# Patient Record
Sex: Female | Born: 1940
Health system: Southern US, Community
[De-identification: ages and names within clinical notes are randomized; demographics above are authoritative.]

## PROBLEM LIST (undated history)

## (undated) DIAGNOSIS — D329 Benign neoplasm of meninges, unspecified: Secondary | ICD-10-CM

## (undated) DIAGNOSIS — I10 Essential (primary) hypertension: Secondary | ICD-10-CM

## (undated) DIAGNOSIS — K219 Gastro-esophageal reflux disease without esophagitis: Secondary | ICD-10-CM

## (undated) DIAGNOSIS — I4891 Unspecified atrial fibrillation: Secondary | ICD-10-CM

## (undated) DIAGNOSIS — R112 Nausea with vomiting, unspecified: Secondary | ICD-10-CM

## (undated) DIAGNOSIS — R569 Unspecified convulsions: Secondary | ICD-10-CM

## (undated) DIAGNOSIS — J189 Pneumonia, unspecified organism: Secondary | ICD-10-CM

## (undated) DIAGNOSIS — R51 Headache: Secondary | ICD-10-CM

## (undated) DIAGNOSIS — I428 Other cardiomyopathies: Secondary | ICD-10-CM

## (undated) DIAGNOSIS — Z9889 Other specified postprocedural states: Secondary | ICD-10-CM

## (undated) DIAGNOSIS — I5042 Chronic combined systolic (congestive) and diastolic (congestive) heart failure: Secondary | ICD-10-CM

## (undated) HISTORY — DX: Unspecified convulsions: R56.9

## (undated) HISTORY — DX: Unspecified atrial fibrillation: I48.91

## (undated) HISTORY — DX: Other cardiomyopathies: I42.8

## (undated) HISTORY — PX: CHOLECYSTECTOMY: SHX55

## (undated) HISTORY — DX: Chronic combined systolic (congestive) and diastolic (congestive) heart failure: I50.42

## (undated) HISTORY — DX: Benign neoplasm of meninges, unspecified: D32.9

## (undated) HISTORY — PX: KNEE ARTHROSCOPY: SHX127

## (undated) HISTORY — DX: Essential (primary) hypertension: I10

## (undated) HISTORY — PX: EYE SURGERY: SHX253

## (undated) HISTORY — PX: CATARACT EXTRACTION, BILATERAL: SHX1313

---

## 1981-01-20 HISTORY — PX: VAGINAL HYSTERECTOMY: SUR661

## 1990-12-21 HISTORY — PX: MYELOMININGOCELE REPAIR: SHX337

## 1997-03-31 ENCOUNTER — Ambulatory Visit (HOSPITAL_COMMUNITY): Admission: RE | Admit: 1997-03-31 | Discharge: 1997-03-31 | Payer: Self-pay | Admitting: Obstetrics and Gynecology

## 1997-06-26 ENCOUNTER — Other Ambulatory Visit: Admission: RE | Admit: 1997-06-26 | Discharge: 1997-06-26 | Payer: Self-pay | Admitting: Family Medicine

## 1998-06-11 ENCOUNTER — Ambulatory Visit (HOSPITAL_COMMUNITY): Admission: RE | Admit: 1998-06-11 | Discharge: 1998-06-11 | Payer: Self-pay | Admitting: Obstetrics and Gynecology

## 1998-07-09 ENCOUNTER — Other Ambulatory Visit: Admission: RE | Admit: 1998-07-09 | Discharge: 1998-07-09 | Payer: Self-pay | Admitting: Obstetrics and Gynecology

## 1999-07-01 ENCOUNTER — Ambulatory Visit (HOSPITAL_COMMUNITY): Admission: RE | Admit: 1999-07-01 | Discharge: 1999-07-01 | Payer: Self-pay | Admitting: Obstetrics and Gynecology

## 1999-07-01 ENCOUNTER — Encounter: Payer: Self-pay | Admitting: Obstetrics and Gynecology

## 1999-08-07 ENCOUNTER — Other Ambulatory Visit: Admission: RE | Admit: 1999-08-07 | Discharge: 1999-08-07 | Payer: Self-pay | Admitting: Obstetrics and Gynecology

## 2000-08-10 ENCOUNTER — Other Ambulatory Visit: Admission: RE | Admit: 2000-08-10 | Discharge: 2000-08-10 | Payer: Self-pay | Admitting: Obstetrics and Gynecology

## 2000-10-21 ENCOUNTER — Encounter: Payer: Self-pay | Admitting: Family Medicine

## 2000-10-21 ENCOUNTER — Encounter: Admission: RE | Admit: 2000-10-21 | Discharge: 2000-10-21 | Payer: Self-pay | Admitting: Family Medicine

## 2001-09-22 ENCOUNTER — Encounter: Payer: Self-pay | Admitting: Obstetrics and Gynecology

## 2001-09-22 ENCOUNTER — Ambulatory Visit (HOSPITAL_COMMUNITY): Admission: RE | Admit: 2001-09-22 | Discharge: 2001-09-22 | Payer: Self-pay | Admitting: Obstetrics and Gynecology

## 2002-10-11 ENCOUNTER — Ambulatory Visit (HOSPITAL_COMMUNITY): Admission: RE | Admit: 2002-10-11 | Discharge: 2002-10-11 | Payer: Self-pay | Admitting: Obstetrics and Gynecology

## 2002-10-11 ENCOUNTER — Encounter: Payer: Self-pay | Admitting: Obstetrics and Gynecology

## 2002-12-30 ENCOUNTER — Ambulatory Visit (HOSPITAL_COMMUNITY): Admission: RE | Admit: 2002-12-30 | Discharge: 2002-12-30 | Payer: Self-pay | Admitting: Gastroenterology

## 2002-12-30 ENCOUNTER — Encounter (INDEPENDENT_AMBULATORY_CARE_PROVIDER_SITE_OTHER): Payer: Self-pay | Admitting: Specialist

## 2003-10-17 ENCOUNTER — Ambulatory Visit (HOSPITAL_COMMUNITY): Admission: RE | Admit: 2003-10-17 | Discharge: 2003-10-17 | Payer: Self-pay | Admitting: Obstetrics and Gynecology

## 2004-10-13 ENCOUNTER — Encounter: Admission: RE | Admit: 2004-10-13 | Discharge: 2004-10-13 | Payer: Self-pay | Admitting: Neurology

## 2004-11-13 ENCOUNTER — Ambulatory Visit (HOSPITAL_COMMUNITY): Admission: RE | Admit: 2004-11-13 | Discharge: 2004-11-13 | Payer: Self-pay | Admitting: Obstetrics and Gynecology

## 2005-08-14 ENCOUNTER — Encounter: Admission: RE | Admit: 2005-08-14 | Discharge: 2005-08-14 | Payer: Self-pay | Admitting: Family Medicine

## 2005-12-23 ENCOUNTER — Ambulatory Visit (HOSPITAL_COMMUNITY): Admission: RE | Admit: 2005-12-23 | Discharge: 2005-12-23 | Payer: Self-pay | Admitting: Specialist

## 2005-12-29 ENCOUNTER — Encounter: Payer: Self-pay | Admitting: Vascular Surgery

## 2005-12-29 ENCOUNTER — Ambulatory Visit (HOSPITAL_COMMUNITY): Admission: RE | Admit: 2005-12-29 | Discharge: 2005-12-29 | Payer: Self-pay | Admitting: Specialist

## 2007-12-20 ENCOUNTER — Emergency Department (HOSPITAL_COMMUNITY): Admission: EM | Admit: 2007-12-20 | Discharge: 2007-12-20 | Payer: Self-pay | Admitting: Emergency Medicine

## 2008-11-20 ENCOUNTER — Encounter: Admission: RE | Admit: 2008-11-20 | Discharge: 2008-11-20 | Payer: Self-pay | Admitting: Otolaryngology

## 2009-08-30 ENCOUNTER — Encounter: Admission: RE | Admit: 2009-08-30 | Discharge: 2009-08-30 | Payer: Self-pay | Admitting: Neurology

## 2010-06-07 NOTE — Op Note (Signed)
Maureen [name], Maureen [name]                ACCOUNT NO.:  [id number]   MEDICAL RECORD NO.:  [id number]          PATIENT TYPE:  AMB   LOCATION:  DAY                          FACILITY:  [facility]   PHYSICIAN:  Kerrin Champagne, M.D.   DATE OF BIRTH:  [birth year]   DATE OF PROCEDURE:  12/23/2005  DATE OF DISCHARGE:                               OPERATIVE REPORT   PREOPERATIVE DIAGNOSIS:  Internal derangement, left knee with medial  meniscal tear by MRI study, recurring effusions of the left knee,  persistent left knee pain.   POSTOPERATIVE DIAGNOSIS:  Chondromalacia changes involving the medial  facet of the patella, the medial and lateral tibial plateaus.  Partial  anterior cruciate ligament tear.  No sign of torn meniscus.  Synovitis  involving the suprapatellar pouch region.   OPERATION PERFORMED:  Left knee arthroscopy with examination of the  knee, arthroscopically.  Partial debridement of lateral and posterior  fibers anterior cruciate ligament that were torn.  Chondroplastic  shaving of both the lateral tibial plateau surface and the medial aspect  of the undersurface of the patella, medial facet, primarily.   SURGEON:  Kerrin Champagne, M.D.   ASSISTANT:  Wende Neighbors, P.A.-C.   ANESTHESIA:  Left knee block, Bruce [employer] Mechanic, M.D. supplemented with  local infiltration of the portals both medial and lateral using Marcaine  0.5% with 1:200,000 epinephrine 10 mL.   TOURNIQUET TIME:  0 minutes.   INDICATIONS FOR PROCEDURE:  This patient is a 70 year old female who  presented this past summer with increasing pain in her left knee and  effusion.  She underwent initial steroid injection with some improvement  in pain and discomfort.  However, pain recurred over the next several  weeks.  She was placed on anti-inflammatory agents, underwent MRI study  because of persistent recurring effusions of the left knee.  Had  findings on MRI consistent with ruptured popliteal cyst as well as  findings of chondromalacia in several areas of the knee joint and torn  posterior horn of the medial meniscus.  She is brought to the operating  room because of persistent severe left knee discomfort, recurring  episodes of effusion to the left knee.  MRI findings of probable  posterior horn meniscus tear.  Intraoperative findings demonstrated  primarily chondromalacia changes with changes of degeneration involving  the tibial plateau surfaces both medial and lateral, partial ACL tear  involving the posterolateral fibers of the ACL with the ACL in the  lateral joint line.  The patient had severe chondromalacia and grade 3  and grade 4 changes involving the medial facet of the patella.  Trochlear groove did not show a great deal of degenerative changes,  however.  There was shaggy synovitis involving the suprapatellar pouch  region laterally as well as over the anteromedial aspect of the knee  joint at medial recess.   DESCRIPTION OF PROCEDURE:  After adequate left knee block the patient  had sedation, awake sedation.  Left lower extremity in the knee holder.  Tourniquet about the upper thigh which was not used during the case.  Standard  prep with Betadine scrub prep solution.  Draped in the usual  manner.  The areas of infiltration of portals both anterior inferior  medial, anterior inferior lateral further infiltrated with Marcaine 0.5%  1:200,000 epinephrine.  Using the needle also to gauge the joint entry  point.   The lateral anterior inferior portal stab incision made with an 11 blade  scalpel and the knee arthroscope sleeve was passed into the knee joint.  The tubing then attached to this scope and the knee inflated.  Knee  evaluated.  The patient had significant amounts of droplets of fat  within the joint line.  The pressure inflow was increased from 80 to 100  mmHg and this decreased the tendency for fat globules.   Medial joint line evaluated.  Patient found to have some  chondromalacia  changes involving the posterior aspect of the medial tibial plateau.  However, the posterior femoral condyle did not show a great deal of  changes.  A stab incision made in the anterior medial inferior aspect of  the knee joint and a probe passed.  This was done after using a blunt  trocar to enter the knee joint.  The probe was then used to evaluate the  patient's medial meniscus over the anterior rim, lateral rim as well as  the posterior rim were evaluated.  The medial joint line was quite  tight; however, visualization of the posterior rim and posterior horn of  the meniscus was seen.  There did not appear to be any significant  damage to the meniscus visually.  Photomicrographs were then obtained  for documentation purposes.  Evaluation of the ACL demonstrated a  partial tear involving the lateral posterior fibers and there was some  subluxation of the ACL within the lateral joint line with flexion.  This  area was debrided.  The appearance of the ACL was that of scar tissue  with myxoid degeneration.  This areas were debrided back to more normal-  appearing fibers and linear arrangement.  The lateral tibial plateau  showed fraying and soft chondromalacia changes grade 2, grade 3 and  these were debrided using full radius shaver down to more normal-  appearing cartilage.  Evaluation of the undersurface of the patella  demonstrated severe chondromalacia changes, changes of denuded bone over  the medial patella, over the medial facet.  These areas were debrided  using full radius shaver as well.  The knee was then irrigated with  copious amounts of irrigant solution.  Evaluation of suprapatellar pouch  did demonstrate several areas of synovitis involving the superior  lateral aspect of the suprapatellar pouch and also the anterior medial  aspect of the knee joint and the medial recess here.  The knee joint was then irrigated with copious amounts of irrigant solution.   Knee then  carefully drained of any irrigant solution.  The ports both  inferolateral and inferomedial were then closed with subcutaneous  sutures of 3-0 Vicryl and then vertical mattress sutures of 4-0 nylon.  Adaptic, 4 x 4s, ABD pad fixed to the skin with Kerlix and an Ace wrap  applied from the left foot to the upper thigh.  The patient will have  knee immobilizer placed in the recovery room.  Patient was then  reactivated and returned to recovery room in satisfactory condition.  Note that at the end of the case, the patient was placed under face mask  anesthesia as well as her knee block because of some minimal discomfort  experienced  at the end of the case.   POSTOPERATIVE CARE:  The patient will use crutches or walker, full  weightbearing as tolerated, left lower extremity.  Be seen back in the  office in two weeks for follow-up visit.  She will use ice locally and  elevate the knee.  Take medicines of Percocet 5/325 #40 one to two every  four to six hours p.r.n. pain and Lodine 400 mg twice daily with meal or  snack.      Kerrin Champagne, M.D.  Electronically Signed     JEN/MEDQ  D:  12/23/2005  T:  12/23/2005  Job:  [id number]

## 2011-06-24 ENCOUNTER — Other Ambulatory Visit: Payer: Self-pay | Admitting: Gastroenterology

## 2012-05-24 ENCOUNTER — Telehealth: Payer: Self-pay | Admitting: *Deleted

## 2012-05-24 ENCOUNTER — Other Ambulatory Visit: Payer: Self-pay | Admitting: Neurology

## 2012-05-26 ENCOUNTER — Encounter: Payer: Self-pay | Admitting: Neurology

## 2012-05-26 ENCOUNTER — Ambulatory Visit (INDEPENDENT_AMBULATORY_CARE_PROVIDER_SITE_OTHER): Payer: Medicare Other | Admitting: Neurology

## 2012-05-26 VITALS — BP 140/88 | HR 90 | Temp 99.1°F | Ht 61.75 in | Wt 162.0 lb

## 2012-05-26 DIAGNOSIS — D329 Benign neoplasm of meninges, unspecified: Secondary | ICD-10-CM

## 2012-05-26 DIAGNOSIS — R569 Unspecified convulsions: Secondary | ICD-10-CM | POA: Insufficient documentation

## 2012-05-26 DIAGNOSIS — G40109 Localization-related (focal) (partial) symptomatic epilepsy and epileptic syndromes with simple partial seizures, not intractable, without status epilepticus: Secondary | ICD-10-CM | POA: Insufficient documentation

## 2012-05-26 DIAGNOSIS — Z5181 Encounter for therapeutic drug level monitoring: Secondary | ICD-10-CM

## 2012-05-26 DIAGNOSIS — G40909 Epilepsy, unspecified, not intractable, without status epilepticus: Secondary | ICD-10-CM | POA: Insufficient documentation

## 2012-05-26 DIAGNOSIS — D320 Benign neoplasm of cerebral meninges: Secondary | ICD-10-CM

## 2012-05-26 DIAGNOSIS — I10 Essential (primary) hypertension: Secondary | ICD-10-CM

## 2012-05-26 MED ORDER — CARBATROL 200 MG PO CP12: 200.0000 mg | ORAL_CAPSULE | Freq: Two times a day (BID) | ORAL | Status: DC

## 2012-05-26 NOTE — Telephone Encounter (Signed)
Waiting for the patient to return call to schedule the appt.

## 2012-05-26 NOTE — Progress Notes (Signed)
[facility]  Provider:  Dr Vickey Huger Referring Provider: No ref. provider found Primary Care Physician:  Cam Hai, CNM  Chief Complaint  Patient presents with  . Follow-up    formerly Dr Imagene Gurney, seizures,meds refill, rm 11    HPI:  Maureen [name] is a 72 y.o. female here as a referral from [facility] for care follow up. Patient has been followed him as her primary care provider by Dr.  Army Melia and has been followed by Dr. Avie Echevaria since July 1992 . This  patient is a right-handed Caucasian married female with a history of a left craniotomy for a left parasagittal meningioma resection on 01/04/1991.   Preoperatively she was treated with Decadron and phenobarbital. After the surgery she was started on Dilantin but developed a rash and became toxic and ataxic to this medication was discontinued. She continued for while to have abnormal sensations in her right leg which in most respects were identified a simple partial seizures. Inspired Dr. Imagene Gurney  last EEG was normal.  She continued to the right foot and leg motor seizures that she describes as a spasm of the right foot.  Dr. Sandria Manly placed on Tegretol since she had marked improvement. At the time of his last visit on 08/06/2009 the patient took brand name Tegretol-XR 200 mg twice a day she had a slight decrease in serum sodium levels and into thousand 8 underwent DEXA scant which was normal. At the time she still reported about once a year and a symptom of vasospasm beginning in the right up going to the right knee and then radiating downwards to the foot with  a duration of less than a minute. Review of Systems: Out of a complete 14 system review, the patient complains of only the following symptoms, and all other reviewed systems are negative. Last spell  Over 3 years .  History   Social History  . Marital Status: Married    Spouse Name: N/A    Number of Children: 2  . Years of Education: N/A   Occupational  History  . retired     [employer] in 1995   Social History Main Topics  . Smoking status: Never Smoker   . Smokeless tobacco: Not on file  . Alcohol Use: Yes     Comment: once monthly  . Drug Use: No  . Sexually Active: Not on file   Other Topics Concern  . Not on file   Social History Narrative   She retired in 1995 from [employer] - . Lives at home with her husband, Jan. They have a son age 60 and a daughter age 65. Has caffeine 4 times a week and alcohol once a month. Denies tobacco and illicit drug use.     Family History  Problem Relation Age of Onset  . Dementia Father     lewy body dementia    Past Medical History  Diagnosis Date  . Seizures     positive for HBP  . HTN (hypertension), benign     Past Surgical History  Procedure Laterality Date  . Vaginal hysterectomy  1983  . Myelominingocele repair  12/1990    Current Outpatient Prescriptions  Medication Sig Dispense Refill  . CarBAMazepine (TEGRETOL PO) Take by mouth 2 (two) times daily. XR-200mg        No current facility-administered medications for this visit.    Allergies as of 05/26/2012  . (No Known Allergies)    Vitals: BP 140/88  Pulse 90  Temp(Src) 99.1 F (37.3 C) (Oral)  Ht 5' 1.75" (1.568 m)  Wt 162 lb (73.483 kg)  BMI 29.89 kg/m2 Last Weight:  Wt Readings from Last 1 Encounters:  05/26/12 160 lb (72.576 kg)   Last Height:   Ht Readings from Last 1 Encounters:  05/26/12 5' 1.75" (1.568 m)   Vision Screening:  See vitals . Physical exam:  General: The patient is awake, alert and appears not in acute distress. The patient is well groomed. Head: Normocephalic, atraumatic. Neck is supple.Cardiovascular:  Regular rate and rhythm without  murmurs or carotid bruit, and without distended neck veins. Respiratory: Lungs are clear to auscultation. Skin:  Without evidence of edema, or rash Trunk: BMI is elevated and patient  has normal posture.  Neurologic exam  : The patient is awake and alert, oriented to place and time.  Memory subjective described as intact. There is a normal attention span & concentration ability. Speech is fluent without dysarthria, dysphonia or aphasia. Mood and affect are appropriate.  Cranial nerves: Pupils are equal and briskly reactive to light. Funduscopic exam without  evidence of pallor or edema. Status post cataract surgery bilat. No diplopia.   Extraocular movements  in vertical and horizontal planes intact and without nystagmus. Visual fields by finger perimetry are intact. Hearing to finger rub intact.  Facial sensation intact to fine touch. Facial motor strength is symmetric and tongue and uvula move midline.  Motor exam:   Normal tone and normal muscle bulk and symmetric normal strength in all extremities.  Sensory:  Fine touch, pinprick and vibration were tested in all extremities. Proprioception is tested in the upper extremities only. This was normal.  Coordination: Rapid alternating movements in the fingers/hands is tested and normal. Finger-to-nose maneuver tested and normal without evidence of ataxia, dysmetria or tremor.  Gait and station: Patient walks without assistive device and is able and assisted stool climb up to the exam table. Strength within normal limits. Stance is stable and normal. Tandem gait is  Deep tendon reflexes: in the  upper and lower extremities are tested , brisker on the right knee and achilles.  Babinski maneuver response is downgoing.   Assessment:  After physical and neurologic examination, review of laboratory studies, imaging, neurophysiology testing and pre-existing records, assessment will be reviewed on the problem list.  Plan:  Treatment plan and additional workup will be reviewed under Problem List.   I will obtain a CBC and a Chem-7 for this patient on chronic carbamazepine  therapy. There needs to be no adjustment in dose and I will refill her medication for the next 12  months.

## 2012-05-27 ENCOUNTER — Other Ambulatory Visit: Payer: Self-pay | Admitting: Neurology

## 2012-05-27 ENCOUNTER — Encounter: Payer: Self-pay | Admitting: Neurology

## 2012-05-27 LAB — BASIC METABOLIC PANEL
BUN/Creatinine Ratio: 25 (ref 11–26)
BUN: 16 mg/dL (ref 8–27)
CO2: 31 mmol/L — ABNORMAL HIGH (ref 19–28)
Calcium: 9.8 mg/dL (ref 8.6–10.2)
Chloride: 98 mmol/L (ref 97–108)
Creatinine, Ser: 0.63 mg/dL (ref 0.57–1.00)
GFR calc Af Amer: 102 mL/min/{1.73_m2} (ref >59)
GFR calc non Af Amer: 89 mL/min/{1.73_m2} (ref >59)
Glucose: 123 mg/dL — ABNORMAL HIGH (ref 65–99)
Potassium: 4.5 mmol/L (ref 3.5–5.2)
Sodium: 139 mmol/L (ref 134–144)

## 2012-05-27 LAB — CBC, NO DIFFERENTIAL/PLATELET
HCT: 39.4 % (ref 34.0–46.6)
Hemoglobin: 13.4 g/dL (ref 11.1–15.9)
MCH: 29.3 pg (ref 26.6–33.0)
MCHC: 34 g/dL (ref 31.5–35.7)
MCV: 86 fL (ref 79–97)
RBC: 4.58 x10E6/uL (ref 3.77–5.28)
RDW: 13.3 % (ref 12.3–15.4)
WBC: 7.9 10*3/uL (ref 3.4–10.8)

## 2012-05-28 ENCOUNTER — Other Ambulatory Visit: Payer: Self-pay

## 2012-05-28 NOTE — Telephone Encounter (Signed)
Patient has always taken Tegretol XR

## 2012-11-01 ENCOUNTER — Other Ambulatory Visit: Payer: Self-pay | Admitting: Family Medicine

## 2012-11-01 DIAGNOSIS — R195 Other fecal abnormalities: Secondary | ICD-10-CM

## 2012-11-01 DIAGNOSIS — R109 Unspecified abdominal pain: Secondary | ICD-10-CM

## 2012-11-01 DIAGNOSIS — R197 Diarrhea, unspecified: Secondary | ICD-10-CM

## 2012-11-02 ENCOUNTER — Ambulatory Visit
Admission: RE | Admit: 2012-11-02 | Discharge: 2012-11-02 | Disposition: A | Discharge status: Home / Self Care | Payer: Medicare Other | Source: Ambulatory Visit | Attending: Family Medicine | Admitting: Family Medicine

## 2012-11-02 DIAGNOSIS — R197 Diarrhea, unspecified: Secondary | ICD-10-CM

## 2012-11-02 MED ORDER — IOHEXOL 300 MG/ML  SOLN
100.0000 mL | Freq: Once | INTRAMUSCULAR | Status: AC | PRN
  Administered 2012-11-02: 100 mL via INTRAVENOUS

## 2012-11-08 ENCOUNTER — Other Ambulatory Visit: Payer: Self-pay | Admitting: Gastroenterology

## 2012-11-08 DIAGNOSIS — R1310 Dysphagia, unspecified: Secondary | ICD-10-CM

## 2012-11-11 ENCOUNTER — Encounter (HOSPITAL_COMMUNITY): Payer: Self-pay | Admitting: Pharmacy Technician

## 2012-11-12 ENCOUNTER — Encounter (HOSPITAL_COMMUNITY): Payer: Self-pay | Admitting: *Deleted

## 2012-11-12 NOTE — Progress Notes (Signed)
Labs from 11/01/2012-BUN and Creat., Urine culture and with micro 10/27/2012 from [facility] on chart.

## 2012-11-17 ENCOUNTER — Ambulatory Visit
Admission: RE | Admit: 2012-11-17 | Discharge: 2012-11-17 | Disposition: A | Discharge status: Home / Self Care | Payer: Medicare Other | Source: Ambulatory Visit | Attending: Gastroenterology | Admitting: Gastroenterology

## 2012-11-17 DIAGNOSIS — R1310 Dysphagia, unspecified: Secondary | ICD-10-CM

## 2012-11-30 ENCOUNTER — Ambulatory Visit (HOSPITAL_COMMUNITY): Payer: Medicare Other | Admitting: Anesthesiology

## 2012-11-30 ENCOUNTER — Encounter (HOSPITAL_COMMUNITY): Payer: Self-pay | Admitting: Anesthesiology

## 2012-11-30 ENCOUNTER — Encounter (HOSPITAL_COMMUNITY)
Admission: RE | Disposition: A | Discharge status: Home / Self Care | Payer: Self-pay | Source: Ambulatory Visit | Attending: Gastroenterology

## 2012-11-30 ENCOUNTER — Ambulatory Visit (HOSPITAL_COMMUNITY)
Admission: RE | Admit: 2012-11-30 | Discharge: 2012-11-30 | Disposition: A | Discharge status: Home / Self Care | Payer: Medicare Other | Source: Ambulatory Visit | Attending: Gastroenterology | Admitting: Gastroenterology

## 2012-11-30 ENCOUNTER — Encounter (HOSPITAL_COMMUNITY): Payer: Medicare Other | Admitting: Anesthesiology

## 2012-11-30 DIAGNOSIS — Z860100 Personal history of colon polyps, unspecified: Secondary | ICD-10-CM | POA: Insufficient documentation

## 2012-11-30 DIAGNOSIS — D126 Benign neoplasm of colon, unspecified: Secondary | ICD-10-CM | POA: Insufficient documentation

## 2012-11-30 DIAGNOSIS — R197 Diarrhea, unspecified: Secondary | ICD-10-CM | POA: Insufficient documentation

## 2012-11-30 DIAGNOSIS — R1310 Dysphagia, unspecified: Secondary | ICD-10-CM | POA: Insufficient documentation

## 2012-11-30 DIAGNOSIS — D1803 Hemangioma of intra-abdominal structures: Secondary | ICD-10-CM | POA: Insufficient documentation

## 2012-11-30 HISTORY — DX: Nausea with vomiting, unspecified: Z98.890

## 2012-11-30 HISTORY — DX: Other specified postprocedural states: R11.2

## 2012-11-30 HISTORY — DX: Other specified postprocedural states: Z98.890

## 2012-11-30 HISTORY — PX: COLONOSCOPY WITH PROPOFOL: SHX5780

## 2012-11-30 HISTORY — DX: Headache: R51

## 2012-11-30 HISTORY — PX: ESOPHAGOGASTRODUODENOSCOPY (EGD) WITH PROPOFOL: SHX5813

## 2012-11-30 HISTORY — DX: Nausea with vomiting, unspecified: R11.2

## 2012-11-30 SURGERY — ESOPHAGOGASTRODUODENOSCOPY (EGD) WITH PROPOFOL
Anesthesia: Monitor Anesthesia Care

## 2012-11-30 MED ORDER — BUTAMBEN-TETRACAINE-BENZOCAINE 2-2-14 % EX AERO
INHALATION_SPRAY | CUTANEOUS | Status: DC | PRN
  Administered 2012-11-30: 2 via TOPICAL

## 2012-11-30 MED ORDER — PROPOFOL INFUSION 10 MG/ML OPTIME
INTRAVENOUS | Status: DC | PRN
  Administered 2012-11-30: 180 ug/kg/min via INTRAVENOUS

## 2012-11-30 MED ORDER — LACTATED RINGERS IV SOLN
INTRAVENOUS | Status: DC | PRN
  Administered 2012-11-30 (×2): via INTRAVENOUS

## 2012-11-30 MED ORDER — PROPOFOL 10 MG/ML IV BOLUS
INTRAVENOUS | Status: DC | PRN
  Administered 2012-11-30: 100 mg via INTRAVENOUS
  Administered 2012-11-30: 30 mg via INTRAVENOUS

## 2012-11-30 MED ORDER — LIDOCAINE HCL (CARDIAC) 20 MG/ML IV SOLN
INTRAVENOUS | Status: DC | PRN
  Administered 2012-11-30: 50 mg via INTRAVENOUS

## 2012-11-30 MED ORDER — LACTATED RINGERS IV SOLN
INTRAVENOUS | Status: DC
  Administered 2012-11-30: 1000 mL via INTRAVENOUS

## 2012-11-30 MED ORDER — SODIUM CHLORIDE 0.9 % IV SOLN: INTRAVENOUS | Status: DC

## 2012-11-30 SURGICAL SUPPLY — 24 items

## 2012-11-30 NOTE — Op Note (Signed)
Problems: Diarrhea, heme positive stool, dysphagia, history of adenomatous colon polyp removed colonoscopically  Endoscopist: Danise Edge  Premedication: Propofol administered by anesthesia  Procedure: Diagnostic esophagogastroduodenoscopy The patient was placed in the left lateral decubitus position. The Pentax gastroscope was passed through the posterior hypopharynx into the proximal esophagus without difficulty. The hypopharynx, larynx, and vocal cords appeared normal.  Esophagoscopy: The proximal, mid, and lower segments of the esophageal mucosa appear normal. The squamocolumnar junction was somewhat irregular and noted at 40 cm from the incisor teeth. There was no endoscopic evidence for the presence of erosive esophagitis, esophageal stricture formation, or Barrett's esophagus.  Gastroscopy: Retroflex view of the gastric cardia and fundus showed a 1 cm subepithelial round polypoid lesion in the gastric cardia. The overlying mucosa was completely normal in appearance. Palpation of the lesion was firm and not suggestive of a lipoma. The gastric body, antrum, and pylorus appeared normal.  Duodenoscopy: The duodenal bulb and descending duodenum appeared normal.  Assessment:  #1. There was a 1 cm subepithelial round polypoid lesion in the gastric cardia which was not biopsied  #2. Otherwise normal esophagogastroduodenoscopy  Recommendation: Schedule endoscopic ultrasound to evaluate the subepithelial lesion in the gastric cardia  Procedure: Diagnostic colonoscopy Anal inspection and digital rectal exam were normal. The Pentax pediatric colonoscope was introduced into the rectum and advanced to the cecum. A normal-appearing ileocecal valve was identified. Colonic preparation for the exam today was good  Rectum. Normal. Retroflexed view of the distal rectum normal  Sigmoid colon and descending colon. Left colonic diverticulosis. A 3 mm sessile polyp was removed from the proximal  descending colon with the cold biopsy forceps  Splenic flexure. Normal  Transverse colon. From the mid transverse colon a 4 mm sessile polyp was removed with the cold biopsy forceps  Hepatic flexure. Normal  Ascending colon. Normal  Cecum and ileocecal valve. A 3 mm sessile polyp in the area of last years cecal polypectomy site was removed with the cold biopsy forceps  Random colon biopsies. Biopsies were performed from the right colon and left colon to look for microscopic colitis  Assessment:  #1. A small polyp was removed from the cecum, a small polyp was removed from the mid transverse colon, and a small polyp was removed from the proximal descending colon. All polyps were submitted in one for pathological evaluation  #2. Left colonic diverticulosis  #3. Random colon biopsies to rule out microscopic colitis pending  Recommendations: Schedule surveillance colonoscopy in 5 years

## 2012-11-30 NOTE — H&P (Signed)
  Problems: Diarrhea, heme positive stool, dysphagia, history of adenomatous colon polyp  History: The patient is a 72 year old female born 06/17/1940. Intermittently, she experiences abdominal cramps and nonbloody diarrhea. On 06/24/2011, she underwent a colonoscopy with removal of a 25 mm adenomatous polyp from the cecum.  The patient received a course of ciprofloxacin and metronidazole to treat her intermittent diarrhea. Stool culture was negative. Stool screen for ova and parasite was negative. Stool screen for C. difficile toxin was negative. Stool was Hemoccult-positive.  Intermittently, the patient experiences solid food esophageal dysphagia without odynophagia. Her barium esophagram and barium tablet swallow showed moderate tertiary contractions of the esophageal body; the barium tablet traversed the esophagus and entered the stomach without obstruction.  On 11/02/2012, CT scan of the abdomen and pelvis showed a 3.6 cm liver hemangioma, prominent common bile duct and left hepatic duct, and colonic diverticulosis.  The patient is scheduled to undergo diagnostic esophagogastroduodenoscopy and colonoscopy today.  Past medical history: Colonic diverticulosis. Left parasagittal meningioma removed in 1992. Laparoscopic cholecystectomy. Hysterectomy. Colonoscopy with removal of a large adenomatous polyp from the cecum.  Allergies: Dilantin.  Exam: The patient is alert and lying comfortably on the endoscopy stretcher. Abdomen is soft and nontender to palpation. Cardiac exam reveals a regular rhythm. Lungs are clear to auscultation.  Plan: Proceed with diagnostic esophagogastroduodenoscopy and colonoscopy

## 2012-11-30 NOTE — Anesthesia Preprocedure Evaluation (Signed)
Anesthesia Evaluation  Patient identified by MRN, date of birth, ID band Patient awake    Reviewed: Allergy & Precautions, H&P , NPO status , Patient's Chart, lab work & pertinent test results  History of Anesthesia Complications (+) PONV  Airway Mallampati: II TM Distance: >3 FB Neck ROM: full    Dental no notable dental hx.    Pulmonary neg pulmonary ROS,  breath sounds clear to auscultation  Pulmonary exam normal       Cardiovascular Exercise Tolerance: Good hypertension, Rhythm:regular Rate:Normal     Neuro/Psych  Headaches, Seizures -, Well Controlled,  negative neurological ROS  negative psych ROS   GI/Hepatic negative GI ROS, Neg liver ROS,   Endo/Other  negative endocrine ROS  Renal/GU negative Renal ROS  negative genitourinary   Musculoskeletal   Abdominal   Peds  Hematology negative hematology ROS (+)   Anesthesia Other Findings   Reproductive/Obstetrics negative OB ROS                           Anesthesia Physical Anesthesia Plan  ASA: II  Anesthesia Plan: MAC   Post-op Pain Management:    Induction:   Airway Management Planned: Simple Face Mask  Additional Equipment:   Intra-op Plan:   Post-operative Plan:   Informed Consent: I have reviewed the patients History and Physical, chart, labs and discussed the procedure including the risks, benefits and alternatives for the proposed anesthesia with the patient or authorized representative who has indicated his/her understanding and acceptance.   Dental Advisory Given  Plan Discussed with: CRNA and Surgeon  Anesthesia Plan Comments:         Anesthesia Quick Evaluation

## 2012-11-30 NOTE — Transfer of Care (Signed)
Immediate Anesthesia Transfer of Care Note  Patient: Maureen [name]  Procedure(s) Performed: Procedure(s): ESOPHAGOGASTRODUODENOSCOPY (EGD) WITH PROPOFOL (N/A) COLONOSCOPY WITH PROPOFOL (N/A)  Patient Location: PACU  Anesthesia Type:MAC  Level of Consciousness: patient cooperative  Airway & Oxygen Therapy: Patient Spontanous Breathing and Patient connected to nasal cannula oxygen  Post-op Assessment: Report given to PACU RN and Post -op Vital signs reviewed and stable  Post vital signs: Reviewed and stable  Complications: No apparent anesthesia complications

## 2012-11-30 NOTE — Preoperative (Signed)
Beta Blockers   Reason not to administer Beta Blockers:Not Applicable 

## 2012-11-30 NOTE — Anesthesia Postprocedure Evaluation (Signed)
  Anesthesia Post-op Note  Patient: Maureen [name]  Procedure(s) Performed: Procedure(s) (LRB): ESOPHAGOGASTRODUODENOSCOPY (EGD) WITH PROPOFOL (N/A) COLONOSCOPY WITH PROPOFOL (N/A)  Patient Location: PACU  Anesthesia Type: MAC  Level of Consciousness: awake and alert   Airway and Oxygen Therapy: Patient Spontanous Breathing  Post-op Pain: mild  Post-op Assessment: Post-op Vital signs reviewed, Patient's Cardiovascular Status Stable, Respiratory Function Stable, Patent Airway and No signs of Nausea or vomiting  Last Vitals:  Filed Vitals:   11/30/12 0914  BP: 158/72  Temp:   Resp: 15    Post-op Vital Signs: stable   Complications: No apparent anesthesia complications

## 2012-12-01 ENCOUNTER — Encounter (HOSPITAL_COMMUNITY): Payer: Self-pay | Admitting: Gastroenterology

## 2012-12-27 ENCOUNTER — Encounter (HOSPITAL_COMMUNITY): Payer: Self-pay | Admitting: *Deleted

## 2012-12-28 ENCOUNTER — Encounter (HOSPITAL_COMMUNITY): Payer: Self-pay | Admitting: Pharmacy Technician

## 2012-12-31 ENCOUNTER — Other Ambulatory Visit: Payer: Self-pay | Admitting: Gastroenterology

## 2012-12-31 NOTE — Addendum Note (Signed)
Addended by: Maurico Perrell on: 12/31/2012 04:27 PM   Modules accepted: Orders  

## 2013-01-05 ENCOUNTER — Ambulatory Visit (HOSPITAL_COMMUNITY)
Admission: RE | Admit: 2013-01-05 | Discharge: 2013-01-05 | Disposition: A | Discharge status: Home / Self Care | Payer: Medicare Other | Source: Ambulatory Visit | Attending: Gastroenterology | Admitting: Gastroenterology

## 2013-01-05 ENCOUNTER — Encounter (HOSPITAL_COMMUNITY): Payer: Self-pay

## 2013-01-05 ENCOUNTER — Encounter (HOSPITAL_COMMUNITY)
Admission: RE | Disposition: A | Discharge status: Home / Self Care | Payer: Self-pay | Source: Ambulatory Visit | Attending: Gastroenterology

## 2013-01-05 ENCOUNTER — Ambulatory Visit (HOSPITAL_COMMUNITY): Payer: Medicare Other | Admitting: Anesthesiology

## 2013-01-05 ENCOUNTER — Encounter (HOSPITAL_COMMUNITY): Payer: Medicare Other | Admitting: Anesthesiology

## 2013-01-05 DIAGNOSIS — K319 Disease of stomach and duodenum, unspecified: Secondary | ICD-10-CM | POA: Insufficient documentation

## 2013-01-05 HISTORY — DX: Pneumonia, unspecified organism: J18.9

## 2013-01-05 HISTORY — PX: FINE NEEDLE ASPIRATION: SHX5430

## 2013-01-05 HISTORY — PX: EUS: SHX5427

## 2013-01-05 SURGERY — ULTRASOUND, UPPER GI TRACT, ENDOSCOPIC
Anesthesia: Monitor Anesthesia Care

## 2013-01-05 MED ORDER — PROPOFOL INFUSION 10 MG/ML OPTIME
INTRAVENOUS | Status: DC | PRN
  Administered 2013-01-05: 140 ug/kg/min via INTRAVENOUS

## 2013-01-05 MED ORDER — PROPOFOL 10 MG/ML IV BOLUS
INTRAVENOUS | Status: AC
  Filled 2013-01-05: qty 20

## 2013-01-05 MED ORDER — FENTANYL CITRATE 0.05 MG/ML IJ SOLN: 25.0000 ug | INTRAMUSCULAR | Status: DC | PRN

## 2013-01-05 MED ORDER — BUTAMBEN-TETRACAINE-BENZOCAINE 2-2-14 % EX AERO
INHALATION_SPRAY | CUTANEOUS | Status: DC | PRN
  Administered 2013-01-05: 2 via TOPICAL

## 2013-01-05 MED ORDER — LACTATED RINGERS IV SOLN
INTRAVENOUS | Status: DC
  Administered 2013-01-05: 1000 mL via INTRAVENOUS

## 2013-01-05 MED ORDER — LIDOCAINE HCL (CARDIAC) 20 MG/ML IV SOLN
INTRAVENOUS | Status: DC | PRN
  Administered 2013-01-05: 100 mg via INTRAVENOUS

## 2013-01-05 MED ORDER — SODIUM CHLORIDE 0.9 % IV SOLN: INTRAVENOUS | Status: DC

## 2013-01-05 NOTE — Preoperative (Signed)
Beta Blockers   Reason not to administer Beta Blockers:Not Applicable 

## 2013-01-05 NOTE — Anesthesia Postprocedure Evaluation (Signed)
Anesthesia Post Note  Patient: Maureen [name]  Procedure(s) Performed: Procedure(s) (LRB): FULL UPPER ENDOSCOPIC ULTRASOUND (EUS) RADIAL (N/A) FINE NEEDLE ASPIRATION (FNA) RADIAL (N/A)  Anesthesia type: MAC  Patient location: PACU  Post pain: Pain level controlled  Post assessment: Post-op Vital signs reviewed  Last Vitals: BP 161/82  Pulse 67  Temp(Src) 36.4 C (Oral)  Resp 16  Ht 5\' 1"  (1.549 m)  Wt 156 lb (70.761 kg)  BMI 29.49 kg/m2  SpO2 99%  Post vital signs: Reviewed  Level of consciousness: awake  Complications: No apparent anesthesia complications

## 2013-01-05 NOTE — H&P (Signed)
Patient interval history reviewed.  Patient examined again.  There has been no change from documented H/P dated 12/24/12 (scanned into chart from our office) except as documented above.  Assessment:  1.  Gastric nodule.  Plan:  1.  Endoscopic ultrasound with possible biopsies. 2.  Risks (bleeding, infection, bowel perforation that could require surgery, sedation-related changes in cardiopulmonary systems), benefits (identification and possible treatment of source of symptoms, exclusion of certain causes of symptoms), and alternatives (watchful waiting, radiographic imaging studies, empiric medical treatment) of upper endoscopy with ultrasound and possible biopsies. (EUS +/- Bx +/- FNA) were explained to patient/family in detail and patient wishes to proceed.

## 2013-01-05 NOTE — Op Note (Signed)
[facility] [address] [place], [address]   ENDOSCOPIC ULTRASOUND PROCEDURE REPORT  PATIENT: Maureen, [name]  MR#: [id number] BIRTHDATE: 1940/04/27  GENDER: Female ENDOSCOPIST: Willis Modena, MD REFERRED BY:  Danise Edge, M.D.  Lupita Raider, M.D. PROCEDURE DATE:  01/05/2013 PROCEDURE:   Upper EUS ASA CLASS:      Class II INDICATIONS:   1.  gastric nodule. MEDICATIONS: MAC sedation, administered by CRNA and Cetacaine spray x 2  DESCRIPTION OF PROCEDURE:   After the risks benefits and alternatives of the procedure were  explained, informed consent was obtained. The patient was then placed in the left, lateral, decubitus postion and IV sedation was administered. Throughout the procedure, the patients blood pressure, pulse and oxygen saturations were monitored continuously.  Under direct visualization, the radial forward-viewing  echoendoscope was introduced through the mouth  and advanced to the second portion of the duodenum .  Water was used as necessary to provide an acoustic interface.  Upon completion of the imaging, water was removed and the patient was sent to the recovery room in satisfactory condition.    FINDINGS:      Approximately 8 mm x 10 mm nodule in proximal body of stomach.  Endoscopically, the lesion had smooth and otherwise normal-appearing overlying mucosa.  Via EUS, the lesion was hypoechoic and arises from the muscular propria; ultrasonographically, there is normal overlying submucosal and mucosal gastric wall layers.  No perigastric adenopathy.  IMPRESSION:      Proximal gastric nodule, diminutive, and highly compatible in appearance with leiomyoma.  RECOMMENDATIONS:     1.  Watch for potential complications of procedure. 2.  Consider repeat EGD/EUS in 2 years.  If lesion has grown in size, would consider FNA biopsies. 3.  Follow-up with Dr. Laural Benes for ongoing GI care.   _______________________________ eSigned:   Willis Modena, MD 01/05/2013 11:46 AM   CC:

## 2013-01-05 NOTE — Transfer of Care (Signed)
Immediate Anesthesia Transfer of Care Note  Patient: Malka So Sublette  Procedure(s) Performed: Procedure(s) with comments: FULL UPPER ENDOSCOPIC ULTRASOUND (EUS) RADIAL (N/A) - EUS with possible FNA FINE NEEDLE ASPIRATION (FNA) RADIAL (N/A)  Patient Location: PACU  Anesthesia Type:MAC  Level of Consciousness: awake, oriented, sedated and patient cooperative  Airway & Oxygen Therapy: Patient Spontanous Breathing and Patient connected to nasal cannula oxygen  Post-op Assessment: Report given to PACU RN and Post -op Vital signs reviewed and stable  Post vital signs: Reviewed and stable  Complications: No apparent anesthesia complications

## 2013-01-05 NOTE — Anesthesia Preprocedure Evaluation (Signed)
Anesthesia Evaluation  Patient identified by MRN, date of birth, ID band Patient awake    Reviewed: Allergy & Precautions, H&P , NPO status , Patient's Chart, lab work & pertinent test results  History of Anesthesia Complications (+) PONV and history of anesthetic complications  Airway Mallampati: II TM Distance: >3 FB Neck ROM: full    Dental no notable dental hx. (+) Dental Advisory Given   Pulmonary pneumonia -, resolved,  breath sounds clear to auscultation  Pulmonary exam normal       Cardiovascular Exercise Tolerance: Good hypertension, Rhythm:regular Rate:Normal     Neuro/Psych  Headaches, Seizures -, Well Controlled,  negative psych ROS   GI/Hepatic negative GI ROS, Neg liver ROS,   Endo/Other  negative endocrine ROS  Renal/GU negative Renal ROS     Musculoskeletal   Abdominal   Peds  Hematology negative hematology ROS (+)   Anesthesia Other Findings   Reproductive/Obstetrics negative OB ROS                           Anesthesia Physical  Anesthesia Plan  ASA: II  Anesthesia Plan: MAC   Post-op Pain Management:    Induction:   Airway Management Planned: Simple Face Mask  Additional Equipment:   Intra-op Plan:   Post-operative Plan:   Informed Consent: I have reviewed the patients History and Physical, chart, labs and discussed the procedure including the risks, benefits and alternatives for the proposed anesthesia with the patient or authorized representative who has indicated his/her understanding and acceptance.   Dental Advisory Given  Plan Discussed with: CRNA  Anesthesia Plan Comments:         Anesthesia Quick Evaluation

## 2013-01-06 ENCOUNTER — Encounter (HOSPITAL_COMMUNITY): Payer: Self-pay | Admitting: Gastroenterology

## 2013-01-07 ENCOUNTER — Encounter: Payer: Self-pay | Admitting: Neurology

## 2013-04-08 ENCOUNTER — Other Ambulatory Visit: Payer: Self-pay | Admitting: Family Medicine

## 2013-04-08 DIAGNOSIS — D1803 Hemangioma of intra-abdominal structures: Secondary | ICD-10-CM

## 2013-04-11 ENCOUNTER — Ambulatory Visit
Admission: RE | Admit: 2013-04-11 | Discharge: 2013-04-11 | Disposition: A | Discharge status: Home / Self Care | Payer: Medicare Other | Source: Ambulatory Visit | Attending: Family Medicine | Admitting: Family Medicine

## 2013-04-11 DIAGNOSIS — D1803 Hemangioma of intra-abdominal structures: Secondary | ICD-10-CM

## 2013-04-14 ENCOUNTER — Other Ambulatory Visit: Payer: Medicare Other

## 2013-04-20 ENCOUNTER — Other Ambulatory Visit: Payer: Self-pay | Admitting: Family Medicine

## 2013-04-20 DIAGNOSIS — R1011 Right upper quadrant pain: Secondary | ICD-10-CM

## 2013-05-02 ENCOUNTER — Inpatient Hospital Stay: Admission: RE | Admit: 2013-05-02 | Payer: Medicare Other | Source: Ambulatory Visit

## 2013-05-02 ENCOUNTER — Ambulatory Visit
Admission: RE | Admit: 2013-05-02 | Discharge: 2013-05-02 | Disposition: A | Discharge status: Home / Self Care | Payer: Medicare Other | Source: Ambulatory Visit | Attending: Family Medicine | Admitting: Family Medicine

## 2013-05-02 ENCOUNTER — Other Ambulatory Visit: Payer: Self-pay | Admitting: Family Medicine

## 2013-05-02 DIAGNOSIS — R1011 Right upper quadrant pain: Secondary | ICD-10-CM

## 2013-05-02 MED ORDER — IOHEXOL 300 MG/ML  SOLN
100.0000 mL | Freq: Once | INTRAMUSCULAR | Status: AC | PRN
  Administered 2013-05-02: 100 mL via INTRAVENOUS

## 2013-05-12 ENCOUNTER — Encounter: Payer: Self-pay | Admitting: Neurology

## 2013-05-13 ENCOUNTER — Encounter: Payer: Self-pay | Admitting: Neurology

## 2013-05-26 ENCOUNTER — Ambulatory Visit (INDEPENDENT_AMBULATORY_CARE_PROVIDER_SITE_OTHER): Payer: Medicare Other | Admitting: Neurology

## 2013-05-26 ENCOUNTER — Encounter: Payer: Self-pay | Admitting: Neurology

## 2013-05-26 VITALS — BP 167/81 | HR 67 | Resp 17 | Ht 62.5 in | Wt 155.0 lb

## 2013-05-26 DIAGNOSIS — R0989 Other specified symptoms and signs involving the circulatory and respiratory systems: Secondary | ICD-10-CM

## 2013-05-26 DIAGNOSIS — R0683 Snoring: Secondary | ICD-10-CM

## 2013-05-26 DIAGNOSIS — R0609 Other forms of dyspnea: Secondary | ICD-10-CM

## 2013-05-26 DIAGNOSIS — G40109 Localization-related (focal) (partial) symptomatic epilepsy and epileptic syndromes with simple partial seizures, not intractable, without status epilepticus: Secondary | ICD-10-CM | POA: Insufficient documentation

## 2013-05-26 DIAGNOSIS — Z5181 Encounter for therapeutic drug level monitoring: Secondary | ICD-10-CM | POA: Insufficient documentation

## 2013-05-26 MED ORDER — CARBAMAZEPINE ER 200 MG PO TB12: 200.0000 mg | ORAL_TABLET | Freq: Two times a day (BID) | ORAL | Status: DC

## 2013-05-26 NOTE — Patient Instructions (Signed)
Osteoporosis Throughout your life, your body breaks down old bone and replaces it with new bone. As you get older, your body does not replace bone as quickly as it breaks it down. By the age of 30 years, most people begin to gradually lose bone because of the imbalance between bone loss and replacement. Some people lose more bone than others. Bone loss beyond a specified normal degree is considered osteoporosis.  Osteoporosis affects the strength and durability of your bones. The inside of the ends of your bones and your flat bones, like the bones of your pelvis, look like honeycomb, filled with tiny open spaces. As bone loss occurs, your bones become less dense. This means that the open spaces inside your bones become bigger and the walls between these spaces become thinner. This makes your bones weaker. Bones of a person with osteoporosis can become so weak that they can break (fracture) during minor accidents, such as a simple fall. CAUSES  The following factors have been associated with the development of osteoporosis:  Smoking.  Drinking more than 2 alcoholic drinks several days per week.  Long-term use of certain medicines:  Corticosteroids.  Chemotherapy medicines.  Thyroid medicines.  Antiepileptic medicines.  Gonadal hormone suppression medicine.  Immunosuppression medicine.  Being underweight.  Lack of physical activity.  Lack of exposure to the sun. This can lead to vitamin D deficiency.  Certain medical conditions:  Certain inflammatory bowel diseases, such as Crohn disease and ulcerative colitis.  Diabetes.  Hyperthyroidism.  Hyperparathyroidism. RISK FACTORS Anyone can develop osteoporosis. However, the following factors can increase your risk of developing osteoporosis:  Gender Women are at higher risk than men.  Age Being older than 50 years increases your risk.  Ethnicity White and Asian people have an increased risk.  Weight Being extremely  underweight can increase your risk of osteoporosis.  Family history of osteoporosis Having a family member who has developed osteoporosis can increase your risk. SYMPTOMS  Usually, people with osteoporosis have no symptoms.  DIAGNOSIS  Signs during a physical exam that may prompt your caregiver to suspect osteoporosis include:  Decreased height. This is usually caused by the compression of the bones that form your spine (vertebrae) because they have weakened and become fractured.  A curving or rounding of the upper back (kyphosis). To confirm signs of osteoporosis, your caregiver may request a procedure that uses 2 low-dose X-ray beams with different levels of energy to measure your bone mineral density (dual-energy X-ray absorptiometry [DXA]). Also, your caregiver may check your level of vitamin D. TREATMENT  The goal of osteoporosis treatment is to strengthen bones in order to decrease the risk of bone fractures. There are different types of medicines available to help achieve this goal. Some of these medicines work by slowing the processes of bone loss. Some medicines work by increasing bone density. Treatment also involves making sure that your levels of calcium and vitamin D are adequate. PREVENTION  There are things you can do to help prevent osteoporosis. Adequate intake of calcium and vitamin D can help you achieve optimal bone mineral density. Regular exercise can also help, especially resistance and weight-bearing activities. If you smoke, quitting smoking is an important part of osteoporosis prevention. MAKE SURE YOU:  Understand these instructions.  Will watch your condition.  Will get help right away if you are not doing well or get worse. FOR MORE INFORMATION www.osteo.org and www.nof.org Document Released: 10/16/2004 Document Revised: 05/03/2012 Document Reviewed: 12/21/2010 Exit[employer] Patient Information 2014 [employer].  

## 2013-05-26 NOTE — Progress Notes (Signed)
[facility]  Provider:  Dr Pretty Weltman Referring Provider: Myrtis Ser, CNM Primary Care Physician:  Serita Grammes, CNM  Chief Complaint  Patient presents with  . Follow-up    Room 11  . Seizures    HPI:  05-26-13.   Maureen [name] is a 73 y.o. female seen at [facility] here as a transfer of care from [facility]. She is a retired Tourist information centre manager , with a Scientist, water quality , taught elementary school.  This patient is a left-handed Caucasian married female with a history of a left craniotomy for a left parasagittal meningioma resection on 01/04/1991. Patient has been followed him as her primary care provider by Dr. Osvaldo Human, now Dr Serita Grammes.  She  has been followed by Dr. Morene Antu since July 1992 . Her last seizure is now over 10 years ago.  Preoperatively she was treated with Decadron and Phenobarbital after she developed a seizure , focal to the right body, this let to an evaluation and the discovery of the meningeoma. After the surgery she was started on Dilantin but developed a rash and became ataxic to this medication was discontinued. She continued for while to have abnormal sensations in her right leg ans sometimes right arm , which in most respects were identified a simple partial seizures. Inspired Dr. Tressia Danas  last EEG was normal. She continued to the right foot and leg motor seizures that she describes as a spasm of the right foot.  Dr. Erling Cruz placed onTegretol since she had marked improvement. At the time of his last visit on 08/06/2009 the patient took brand nameTegretol-XR 200 mg twice a day she had a slight decrease in serum Na levels and 2008 underwent DEXA scan,  which was normal. She still reported about once a year  A symptom of leg spasm, no seizures per se. Possible  symptom of spasm beginning in the right  Foot and ankle up going to the right knee and then radiating downwards to the foot with a duration of less than a minute. A repeated DEXA scan revealed ion13  April 2014 newly diagnosed severe osteopenia. She begun taking vitamin D and calcium, recently in the last 6 month.     Review of Systems: Out of a complete 14 system review, the patient complains of only the following symptoms, and all other reviewed systems are negative. Last spell  over 5 years ago, no seizure identified over a decade . Snoring.  Her husband uses CPAP and she feels bothered by it. She sleeps 6-7  hours at night   History   Social History  . Marital Status: Married    Spouse Name: Jan    Number of Children: 2  . Years of Education: Master's   Occupational History  . retired     [employer] in [place]  .     Social History Main Topics  . Smoking status: Never Smoker   . Smokeless tobacco: Never Used  . Alcohol Use: Yes     Comment: once monthly  . Drug Use: No  . Sexual Activity: Not on file   Other Topics Concern  . Not on file   Social History Narrative   She retired in 1995 from [employer]. Lives at home with her husband, Jan. They have a son age 49 and a  son age 43. Has caffeine 4 times a week and alcohol once a month. Denies tobacco and illicit drug use.    Patient is left handed.  Patient has a Master's degree.    Family History  Problem Relation Age of Onset  . Dementia Father     lewy body dementia    Past Medical History  Diagnosis Date  . Seizures     positive for HBP  . Benign tumor of meninges   . PONV (postoperative nausea and vomiting)   . Headache(784.0)   . HTN (hypertension), benign     pt. states no hypertension  . Pneumonia     ? as child    Past Surgical History  Procedure Laterality Date  . Vaginal hysterectomy  1983  . Myelominingocele repair  12/1990  . Cholecystectomy    . Esophagogastroduodenoscopy (egd) with propofol N/A 11/30/2012    Procedure: ESOPHAGOGASTRODUODENOSCOPY (EGD) WITH PROPOFOL;  Surgeon: Garlan Fair, MD;  Location: [facility];  Service: Endoscopy;  Laterality: N/A;   . Colonoscopy with propofol N/A 11/30/2012    Procedure: COLONOSCOPY WITH PROPOFOL;  Surgeon: Garlan Fair, MD;  Location: [facility];  Service: Endoscopy;  Laterality: N/A;  . Eus N/A 01/05/2013    Procedure: FULL UPPER ENDOSCOPIC ULTRASOUND (EUS) RADIAL;  Surgeon: Arta Silence, MD;  Location: [facility];  Service: Endoscopy;  Laterality: N/A;  EUS with possible FNA  . Fine needle aspiration N/A 01/05/2013    Procedure: FINE NEEDLE ASPIRATION (FNA) RADIAL;  Surgeon: Arta Silence, MD;  Location: [facility]COPY;  Service: Endoscopy;  Laterality: N/A;    Current Outpatient Prescriptions  Medication Sig Dispense Refill  . aspirin EC 81 MG tablet Take 81 mg by mouth daily.      . calcium-vitamin D (OSCAL WITH D) 500-200 MG-UNIT per tablet Take 1 tablet by mouth daily with breakfast.      . carbamazepine (TEGRETOL XR) 200 MG 12 hr tablet Take 200 mg by mouth 2 (two) times daily.      . cholecalciferol (VITAMIN D) 1000 UNITS tablet Take 1,000 Units by mouth daily.      . Multiple Vitamin (MULTIVITAMIN WITH MINERALS) TABS tablet Take 1 tablet by mouth daily.      . pravastatin (PRAVACHOL) 20 MG tablet 1 tablet daily.      Marland Kitchen triamcinolone lotion (KENALOG) 0.1 % as needed.       No current facility-administered medications for this visit.    Allergies as of 05/26/2013 - Review Complete 05/26/2013  Allergen Reaction Noted  . Dilantin [phenytoin sodium extended]  05/26/2012    Vitals: BP 167/81  Pulse 67  Resp 17  Ht 5' 2.5" (1.588 m)  Wt 155 lb (70.308 kg)  BMI 27.88 kg/m2 Last Weight:  Wt Readings from Last 1 Encounters:  05/26/13 155 lb (70.308 kg)   Last Height:   Ht Readings from Last 1 Encounters:  05/26/13 5' 2.5" (1.588 m)   Vision Screening:  See vitals . Physical exam: RR 17, no distended neck veins, no Visible edema a or rash. Well groomed.   General: The patient is awake, alert and appears not in acute distress. The patient is well groomed. Head:  Normocephalic, atraumatic. Neck is supple.Cardiovascular:  Regular rate and rhythm without  murmurs or carotid bruit, and without distended neck veins. Mallampati 3 - with a low hanging uvula. And slight retrognathia.  Neck circumference was 15.5 inches.  Respiratory: Lungs are clear to auscultation. Skin:  Without evidence of edema, or rash Trunk: BMI is elevated .  Neurologic exam : The patient is awake and alert, oriented to place and time.  Memory subjective described as intact. There  is a normal attention span & concentration ability. Speech is fluent without dysarthria, dysphonia or aphasia. Mood and affect are appropriate.  Cranial nerves: Pupils are equal and briskly reactive to light. Status post cataract , right eyelid slight ptosis. Funduscopic exam without  evidence of pallor or edema. No diplopia.   Extraocular movements  in vertical and horizontal planes intact and without nystagmus.  Visual fields by finger perimetry are intact. Hearing to finger rub intact.  Facial sensation intact to fine touch.  Facial motor strength is symmetric and tongue midline.  Motor exam:   Normal tone and  muscle bulk and symmetric normal strength in all extremities.  Sensory:  Fine touch, pinprick and vibration were tested in all extremities. Proprioception is tested in the upper extremities only. This was normal. Coordination: Rapid alternating movements in the fingers/hands is tested and normal. Finger-to-nose maneuver tested and normal without evidence of ataxia, dysmetria or tremor.  Gait and station: Patient walks without assistive device . Strength within normal limits. Stance is stable and normal. Tandem gait is  Deep tendon reflexes: in the  upper and lower extremities are tested , brisker on the right knee and achilles.  Babinski maneuver response is downgoing.   Assessment:  After physical and neurologic examination, review of laboratory studies, imaging, neurophysiology testing and  pre-existing records, assessment is:  1) well controlled seizure disorder on tegretol, but the medication may contribute to her Osteopenia.  2) obesity and snoring, Mallampati high grade. She is at high risk for sleep apnea.  She takes brief naps.  Plan:  Treatment plan and additional workup will be reviewed under Problem List.   I will obtain a CBC and a Chem-7 for this patient on  carbamazepine  Therapy.  There needs to be no adjustment in dose  For the seizure control, but a change may be indicated for osteopenia. I will refill her medication for the next 12 months. Rv with Np

## 2013-09-23 ENCOUNTER — Other Ambulatory Visit: Payer: Self-pay | Admitting: Family Medicine

## 2013-09-23 ENCOUNTER — Ambulatory Visit
Admission: RE | Admit: 2013-09-23 | Discharge: 2013-09-23 | Disposition: A | Discharge status: Home / Self Care | Payer: Medicare Other | Source: Ambulatory Visit | Attending: Family Medicine | Admitting: Family Medicine

## 2013-09-23 DIAGNOSIS — R1031 Right lower quadrant pain: Secondary | ICD-10-CM

## 2013-09-23 MED ORDER — IOHEXOL 300 MG/ML  SOLN
50.0000 mL | Freq: Once | INTRAMUSCULAR | Status: AC | PRN
  Administered 2013-09-23: 50 mL via ORAL

## 2013-09-23 MED ORDER — IOHEXOL 300 MG/ML  SOLN
100.0000 mL | Freq: Once | INTRAMUSCULAR | Status: AC | PRN
  Administered 2013-09-23: 100 mL via INTRAVENOUS

## 2014-05-30 ENCOUNTER — Ambulatory Visit: Payer: Medicare Other | Admitting: Adult Health

## 2014-06-22 ENCOUNTER — Other Ambulatory Visit: Payer: Self-pay | Admitting: Neurology

## 2014-07-10 ENCOUNTER — Encounter: Payer: Self-pay | Admitting: Neurology

## 2014-07-11 ENCOUNTER — Encounter: Payer: Self-pay | Admitting: Adult Health

## 2014-07-11 ENCOUNTER — Ambulatory Visit (INDEPENDENT_AMBULATORY_CARE_PROVIDER_SITE_OTHER): Payer: Medicare Other | Admitting: Adult Health

## 2014-07-11 VITALS — BP 141/79 | HR 72 | Ht 62.0 in | Wt 153.0 lb

## 2014-07-11 DIAGNOSIS — Z5181 Encounter for therapeutic drug level monitoring: Secondary | ICD-10-CM

## 2014-07-11 DIAGNOSIS — Z8669 Personal history of other diseases of the nervous system and sense organs: Secondary | ICD-10-CM

## 2014-07-11 DIAGNOSIS — R569 Unspecified convulsions: Secondary | ICD-10-CM | POA: Diagnosis not present

## 2014-07-11 DIAGNOSIS — Z86011 Personal history of benign neoplasm of the brain: Secondary | ICD-10-CM

## 2014-07-11 MED ORDER — TEGRETOL-XR 200 MG PO TB12: 200.0000 mg | ORAL_TABLET | Freq: Two times a day (BID) | ORAL | Status: DC

## 2014-07-11 NOTE — Progress Notes (Signed)
PATIENT: Maureen [name] DOB: 03-20-1940  REASON FOR VISIT: follow up- seizures, history of meningioma HISTORY FROM: patient  HISTORY OF PRESENT ILLNESS: Maureen [name] is a 74 year old female with a history of seizures and meningioma. She returns today for follow-up. Patient is currently taken Tegretol XL and tolerating it well. She reports that she is not had a seizure since the last visit. She is able to complete all ADLs independently. She operates a Teacher, music without difficulty. Since the last visit she does notice a mild tremor in the left hand is intermittent. She denies any changes with her gait or balance. She continues to take calcium and vitamin D supplementation for osteopenia She returns today for an evaluation.  HISTORY 05/26/13 (CD):  Maureen [name] is a 74 y.o. female seen at [facility] here as a transfer of care from [facility]. She is a retired Tourist information centre manager , with a Scientist, water quality , taught elementary school.  This patient is a left-handed Caucasian married female with a history of a left craniotomy for a left parasagittal meningioma resection on 01/04/1991. Patient has been followed him as her primary care provider by Dr. Osvaldo Human, now Dr Serita Grammes.  She has been followed by Dr. Morene Antu since July 1992 . Her last seizure is now over 10 years ago.  Preoperatively she was treated with Decadron and Phenobarbital after she developed a seizure , focal to the right body, this let to an evaluation and the discovery of the meningeoma. After the surgery she was started on Dilantin but developed a rash and became ataxic to this medication was discontinued. She continued for while to have abnormal sensations in her right leg ans sometimes right arm , which in most respects were identified a simple partial seizures. Inspired Dr. Tressia Danas last EEG was normal. She continued to the right foot and leg motor seizures that she describes as a spasm of the right foot. Dr. Erling Cruz placed onTegretol  since she had marked improvement. At the time of his last visit on 08/06/2009 the patient took brand nameTegretol-XR 200 mg twice a day she had a slight decrease in serum Na levels and 2008 underwent DEXA scan, which was normal. She still reported about once a year A symptom of leg spasm, no seizures per se. Possible symptom of spasm beginning in the right Foot and ankle up going to the right knee and then radiating downwards to the foot with a duration of less than a minute. A repeated DEXA scan revealed ion13 April 2014 newly diagnosed severe osteopenia. She begun taking vitamin D and calcium, recently in the last 6 month.     REVIEW OF SYSTEMS: Out of a complete 14 system review of symptoms, the patient complains only of the following symptoms, and all other reviewed systems are negative.  Walking difficulty, rash  ALLERGIES: Allergies  Allergen Reactions  . Dilantin [Phenytoin Sodium Extended]      Stupor, ataxia.     HOME MEDICATIONS: Outpatient Prescriptions Prior to Visit  Medication Sig Dispense Refill  . aspirin EC 81 MG tablet Take 81 mg by mouth daily.    . calcium-vitamin D (OSCAL WITH D) 500-200 MG-UNIT per tablet Take 1 tablet by mouth daily with breakfast.    . cholecalciferol (VITAMIN D) 1000 UNITS tablet Take 1,000 Units by mouth daily.    . Multiple Vitamin (MULTIVITAMIN WITH MINERALS) TABS tablet Take 1 tablet by mouth daily.    . pravastatin (PRAVACHOL) 20 MG tablet 1 tablet daily.    Marland Kitchen  TEGRETOL-XR 200 MG 12 hr tablet TAKE ONE TABLET BY MOUTH TWICE DAILY 60 tablet 0  . triamcinolone lotion (KENALOG) 0.1 % as needed.     No facility-administered medications prior to visit.    PAST MEDICAL HISTORY: Past Medical History  Diagnosis Date  . Seizures     positive for HBP  . Benign tumor of meninges   . PONV (postoperative nausea and vomiting)   . Headache(784.0)   . HTN (hypertension), benign     pt. states no hypertension  . Pneumonia     ? as child     PAST SURGICAL HISTORY: Past Surgical History  Procedure Laterality Date  . Vaginal hysterectomy  1983  . Myelominingocele repair  12/1990  . Cholecystectomy    . Esophagogastroduodenoscopy (egd) with propofol N/A 11/30/2012    Procedure: ESOPHAGOGASTRODUODENOSCOPY (EGD) WITH PROPOFOL;  Surgeon: Garlan Fair, MD;  Location: [facility];  Service: Endoscopy;  Laterality: N/A;  . Colonoscopy with propofol N/A 11/30/2012    Procedure: COLONOSCOPY WITH PROPOFOL;  Surgeon: Garlan Fair, MD;  Location: [facility];  Service: Endoscopy;  Laterality: N/A;  . Eus N/A 01/05/2013    Procedure: FULL UPPER ENDOSCOPIC ULTRASOUND (EUS) RADIAL;  Surgeon: Arta Silence, MD;  Location: [facility];  Service: Endoscopy;  Laterality: N/A;  EUS with possible FNA  . Fine needle aspiration N/A 01/05/2013    Procedure: FINE NEEDLE ASPIRATION (FNA) RADIAL;  Surgeon: Arta Silence, MD;  Location: [facility];  Service: Endoscopy;  Laterality: N/A;    FAMILY HISTORY: Family History  Problem Relation Age of Onset  . Dementia Father     lewy body dementia    SOCIAL HISTORY: History   Social History  . Marital Status: Married    Spouse Name: Jan  . Number of Children: 2  . Years of Education: Master's   Occupational History  . retired     [employer] in [place]  .     Social History Main Topics  . Smoking status: Never Smoker   . Smokeless tobacco: Never Used  . Alcohol Use: 0.0 oz/week    0 Standard drinks or equivalent per week     Comment: once monthly  . Drug Use: No  . Sexual Activity: Not on file   Other Topics Concern  . Not on file   Social History Narrative   She retired in 1995 from [employer]. Lives at home with her husband, Jan. They have a son age 56 and a  son age 19. Cafffeine once daily. a week and alcohol once a month. Denies tobacco and illicit drug use.    Patient is left handed.   Patient has a Master's degree.      PHYSICAL  EXAM  Filed Vitals:   07/11/14 1527  BP: 141/79  Pulse: 72  Height: 5\' 2"  (1.575 m)  Weight: 153 lb (69.4 kg)   Body mass index is 27.98 kg/(m^2).  Generalized: Well developed, in no acute distress   Neurological examination  Mentation: Alert oriented to time, place, history taking. Follows all commands speech and language fluent Cranial nerve II-XII: Pupils were equal round reactive to light. Extraocular movements were full, visual field were full on confrontational test. Facial sensation and strength were normal. Uvula tongue midline. Head turning and shoulder shrug  were normal and symmetric. Motor: The motor testing reveals 5 over 5 strength of all 4 extremities. Good symmetric motor tone is noted throughout.  Sensory: Sensory testing is intact to soft  touch on all 4 extremities. No evidence of extinction is noted.  Coordination: Cerebellar testing reveals good finger-nose-finger and heel-to-shin bilaterally.  Gait and station: Gait is normal. Tandem gait is normal. Romberg is negative. No drift is seen.  Reflexes: Deep tendon reflexes are symmetric and normal bilaterally.    DIAGNOSTIC DATA (LABS, IMAGING, TESTING) - I reviewed patient records, labs, notes, testing and imaging myself where available.      ASSESSMENT AND PLAN 74 y.o. year old female  has a past medical history of Seizures; Benign tumor of meninges; PONV (postoperative nausea and vomiting); Headache(784.0); HTN (hypertension), benign; and Pneumonia. here with:  1. Seizures 2. History of meningioma  Overall the patient is doing well. She reports no seizures since the last visit. She will continue taking Tegretol. I will check blood work today. Patient advised to continue taking vitamin D and calcium supplementation. If her symptoms worsen or she develops new symptoms she she'll let us know. Otherwise she will follow-up in one year or sooner if needed.   Ward Givens, MSN, NP-C 07/11/2014, 3:42 PM [facility] [address], [place],  [address] [phone]  Note: This document was prepared with digital dictation and possible smart phrase technology. Any transcriptional errors that result from this process are unintentional.

## 2014-07-11 NOTE — Patient Instructions (Signed)
Continue Tegretol. I will check blood work today.

## 2014-07-12 ENCOUNTER — Telehealth: Payer: Self-pay

## 2014-07-12 LAB — COMPREHENSIVE METABOLIC PANEL
ALT: 23 IU/L (ref 0–32)
AST: 21 IU/L (ref 0–40)
Albumin/Globulin Ratio: 2.5 (ref 1.1–2.5)
Albumin: 4.5 g/dL (ref 3.5–4.8)
Alkaline Phosphatase: 85 IU/L (ref 39–117)
BUN/Creatinine Ratio: 30 — ABNORMAL HIGH (ref 11–26)
BUN: 18 mg/dL (ref 8–27)
Bilirubin Total: <0.2 mg/dL (ref 0.0–1.2)
CO2: 27 mmol/L (ref 18–29)
Calcium: 9.4 mg/dL (ref 8.7–10.3)
Chloride: 98 mmol/L (ref 97–108)
Creatinine, Ser: 0.6 mg/dL (ref 0.57–1.00)
GFR calc Af Amer: 102 mL/min/{1.73_m2} (ref >59)
GFR calc non Af Amer: 89 mL/min/{1.73_m2} (ref >59)
Globulin, Total: 1.8 g/dL (ref 1.5–4.5)
Glucose: 112 mg/dL — ABNORMAL HIGH (ref 65–99)
Potassium: 5.3 mmol/L — ABNORMAL HIGH (ref 3.5–5.2)
Sodium: 138 mmol/L (ref 134–144)
Total Protein: 6.3 g/dL (ref 6.0–8.5)

## 2014-07-12 LAB — CBC WITH DIFFERENTIAL/PLATELET
Basophils Absolute: 0 10*3/uL (ref 0.0–0.2)
Basos: 0 %
EOS (ABSOLUTE): 0 10*3/uL (ref 0.0–0.4)
Eos: 1 %
Hematocrit: 38.5 % (ref 34.0–46.6)
Hemoglobin: 12.8 g/dL (ref 11.1–15.9)
Immature Grans (Abs): 0 10*3/uL (ref 0.0–0.1)
Immature Granulocytes: 0 %
Lymphocytes Absolute: 2.1 10*3/uL (ref 0.7–3.1)
Lymphs: 30 %
MCH: 28.9 pg (ref 26.6–33.0)
MCHC: 33.2 g/dL (ref 31.5–35.7)
MCV: 87 fL (ref 79–97)
Monocytes Absolute: 0.6 10*3/uL (ref 0.1–0.9)
Monocytes: 8 %
Neutrophils Absolute: 4.4 10*3/uL (ref 1.4–7.0)
Neutrophils: 61 %
Platelets: 321 10*3/uL (ref 150–379)
RBC: 4.43 x10E6/uL (ref 3.77–5.28)
RDW: 14.1 % (ref 12.3–15.4)
WBC: 7.1 10*3/uL (ref 3.4–10.8)

## 2014-07-12 LAB — CARBAMAZEPINE LEVEL, TOTAL: Carbamazepine Lvl: 5.3 ug/mL (ref 4.0–12.0)

## 2014-07-12 NOTE — Telephone Encounter (Signed)
-----   Message from Ward Givens, NP sent at 07/12/2014  8:26 AM EDT ----- Lab work is unremarkable. Please call the patient.

## 2014-07-12 NOTE — Telephone Encounter (Signed)
Patient called back and I spoke to her and relayed normal labs.

## 2014-07-12 NOTE — Telephone Encounter (Signed)
Called patient and left her a message asking her to call me back .  

## 2015-01-01 ENCOUNTER — Telehealth: Payer: Self-pay | Admitting: Neurology

## 2015-01-01 NOTE — Telephone Encounter (Signed)
Patient is calling in regard to her Rx Tegretol XR 200 mg 12 hr.  Her insurance needs an authorization stating that she needs the Tegretol and not the generic. Please call.

## 2015-01-03 NOTE — Telephone Encounter (Signed)
Ins requires PA.  All clinical info provided, request is under review Ref # [facility]) responded stating Brand Name Tegretol XR is covered under the benefit plan, and no Prior Josem Kaufmann is required Ref # [id number]  Indicated a tier exception is in place at this time.

## 2015-01-03 NOTE — Telephone Encounter (Signed)
Spoke to pt.  I asked her if her insurance is requiring a letter stating that she needs BN tegretol and not the generic or do they need a PA? Pt states that she thinks you can just call her insurance and give it to them verbally. I am not sure about this. I will send it to our pharmacy tech.

## 2015-01-10 NOTE — Telephone Encounter (Signed)
Pt called to check status of prior auth. She sts she needs brand tegretol due to ins will take it higher tier without prior auth. She does not get same results with generic.

## 2015-01-10 NOTE — Telephone Encounter (Signed)
I called ins.  Spoke with [employer].  She verified the tier exception was already approved to reduce this drug to a tier 1 co-pay Ref # [id number] Called patient back to advise.  Got no answer.  Left message.

## 2015-01-29 ENCOUNTER — Other Ambulatory Visit: Payer: Self-pay | Admitting: Family Medicine

## 2015-01-29 ENCOUNTER — Ambulatory Visit
Admission: RE | Admit: 2015-01-29 | Discharge: 2015-01-29 | Disposition: A | Discharge status: Home / Self Care | Payer: PRIVATE HEALTH INSURANCE | Source: Ambulatory Visit | Attending: Family Medicine | Admitting: Family Medicine

## 2015-01-29 DIAGNOSIS — D329 Benign neoplasm of meninges, unspecified: Secondary | ICD-10-CM

## 2015-01-29 MED ORDER — GADOBENATE DIMEGLUMINE 529 MG/ML IV SOLN
14.0000 mL | Freq: Once | INTRAVENOUS | Status: AC | PRN
  Administered 2015-01-29: 14 mL via INTRAVENOUS

## 2015-03-09 ENCOUNTER — Other Ambulatory Visit: Payer: Self-pay | Admitting: Gastroenterology

## 2015-03-09 ENCOUNTER — Encounter (HOSPITAL_COMMUNITY): Payer: Self-pay | Admitting: *Deleted

## 2015-03-13 ENCOUNTER — Encounter (HOSPITAL_COMMUNITY): Payer: Self-pay | Admitting: *Deleted

## 2015-03-14 ENCOUNTER — Ambulatory Visit (HOSPITAL_COMMUNITY)
Admission: RE | Admit: 2015-03-14 | Discharge: 2015-03-14 | Disposition: A | Discharge status: Home / Self Care | Payer: Medicare Other | Source: Ambulatory Visit | Attending: Gastroenterology | Admitting: Gastroenterology

## 2015-03-14 ENCOUNTER — Encounter (HOSPITAL_COMMUNITY)
Admission: RE | Disposition: A | Discharge status: Home / Self Care | Payer: Self-pay | Source: Ambulatory Visit | Attending: Gastroenterology

## 2015-03-14 ENCOUNTER — Ambulatory Visit (HOSPITAL_COMMUNITY): Payer: Medicare Other | Admitting: Anesthesiology

## 2015-03-14 ENCOUNTER — Encounter (HOSPITAL_COMMUNITY): Payer: Self-pay

## 2015-03-14 DIAGNOSIS — I700 Atherosclerosis of aorta: Secondary | ICD-10-CM | POA: Diagnosis not present

## 2015-03-14 DIAGNOSIS — Z7982 Long term (current) use of aspirin: Secondary | ICD-10-CM | POA: Diagnosis not present

## 2015-03-14 DIAGNOSIS — I10 Essential (primary) hypertension: Secondary | ICD-10-CM | POA: Diagnosis not present

## 2015-03-14 DIAGNOSIS — K3189 Other diseases of stomach and duodenum: Secondary | ICD-10-CM | POA: Insufficient documentation

## 2015-03-14 DIAGNOSIS — E785 Hyperlipidemia, unspecified: Secondary | ICD-10-CM | POA: Insufficient documentation

## 2015-03-14 DIAGNOSIS — Z79899 Other long term (current) drug therapy: Secondary | ICD-10-CM | POA: Diagnosis not present

## 2015-03-14 HISTORY — PX: ESOPHAGOGASTRODUODENOSCOPY (EGD) WITH PROPOFOL: SHX5813

## 2015-03-14 HISTORY — DX: Gastro-esophageal reflux disease without esophagitis: K21.9

## 2015-03-14 HISTORY — PX: EUS: SHX5427

## 2015-03-14 SURGERY — ESOPHAGOGASTRODUODENOSCOPY (EGD) WITH PROPOFOL
Anesthesia: Monitor Anesthesia Care

## 2015-03-14 MED ORDER — PROPOFOL 10 MG/ML IV BOLUS
INTRAVENOUS | Status: DC | PRN
  Administered 2015-03-14: 20 mg via INTRAVENOUS

## 2015-03-14 MED ORDER — SODIUM CHLORIDE 0.9 % IV SOLN
INTRAVENOUS | Status: DC
Start: 2015-03-14 — End: 2015-03-14

## 2015-03-14 MED ORDER — SODIUM CHLORIDE 0.9 % IV SOLN: INTRAVENOUS | Status: DC

## 2015-03-14 MED ORDER — PROPOFOL 10 MG/ML IV BOLUS
INTRAVENOUS | Status: AC
  Filled 2015-03-14: qty 60

## 2015-03-14 MED ORDER — LACTATED RINGERS IV SOLN
INTRAVENOUS | Status: DC
  Administered 2015-03-14: 1000 mL via INTRAVENOUS

## 2015-03-14 MED ORDER — PROPOFOL 500 MG/50ML IV EMUL
INTRAVENOUS | Status: DC | PRN
  Administered 2015-03-14: 120 ug/kg/min via INTRAVENOUS

## 2015-03-14 SURGICAL SUPPLY — 14 items

## 2015-03-14 NOTE — Discharge Instructions (Signed)
Endoscopic Ultrasound ° °Care After °Please read the instructions outlined below and refer to this sheet in the next few weeks. These discharge instructions provide you with general information on caring for yourself after you leave the hospital. Your doctor may also give you specific instructions. While your treatment has been planned according to the most current medical practices available, unavoidable complications occasionally occur. If you have any problems or questions after discharge, please call Dr. Aunika Kirsten ([facility]) at [phone]. ° °HOME CARE INSTRUCTIONS °Activity °· You may resume your regular activity but move at a slower pace for the next 24 hours.  °· Take frequent rest periods for the next 24 hours.  °· Walking will help expel (get rid of) the air and reduce the bloated feeling in your abdomen.  °· No driving for 24 hours (because of the anesthesia (medicine) used during the test).  °· You may shower.  °· Do not sign any important legal documents or operate any machinery for 24 hours (because of the anesthesia used during the test).  °Nutrition °· Drink plenty of fluids.  °· You may resume your normal diet.  °· Begin with a light meal and progress to your normal diet.  °· Avoid alcoholic beverages for 24 hours or as instructed by your caregiver.  °Medications °You may resume your normal medications unless your caregiver tells you otherwise. °What you can expect today °· You may experience abdominal discomfort such as a feeling of fullness or "gas" pains.  °· You may experience a sore throat for 2 to 3 days. This is normal. Gargling with salt water may help this.  °·  °SEEK IMMEDIATE MEDICAL CARE IF: °· You have excessive nausea (feeling sick to your stomach) and/or vomiting.  °· You have severe abdominal pain and distention (swelling).  °· You have trouble swallowing.  °· You have a temperature over 100° F (37.8° C).  °· You have rectal bleeding or vomiting of blood.  °Document  Released: 08/21/2003 Document Revised: 09/18/2010 Document Reviewed: 03/03/2007 °ExitCare® Patient Information ©2012 [employer]. °

## 2015-03-14 NOTE — Anesthesia Postprocedure Evaluation (Signed)
Anesthesia Post Note  Patient: Maureen [name]  Procedure(s) Performed: Procedure(s) (LRB): ESOPHAGOGASTRODUODENOSCOPY (EGD) WITH PROPOFOL (N/A) UPPER ENDOSCOPIC ULTRASOUND (EUS) RADIAL (N/A)  Patient location during evaluation: PACU Anesthesia Type: MAC Level of consciousness: awake and alert Pain management: pain level controlled Vital Signs Assessment: post-procedure vital signs reviewed and stable Respiratory status: spontaneous breathing, nonlabored ventilation, respiratory function stable and patient connected to nasal cannula oxygen Cardiovascular status: stable and blood pressure returned to baseline Anesthetic complications: no    Last Vitals:  Filed Vitals:   03/14/15 0945 03/14/15 1000  BP: 173/92 168/82  Pulse: 61   Temp:    Resp: 18     Last Pain: There were no vitals filed for this visit.               Riccardo Dubin

## 2015-03-14 NOTE — Transfer of Care (Signed)
Immediate Anesthesia Transfer of Care Note  Patient: Maureen [name]  Procedure(s) Performed: Procedure(s): ESOPHAGOGASTRODUODENOSCOPY (EGD) WITH PROPOFOL (N/A) UPPER ENDOSCOPIC ULTRASOUND (EUS) RADIAL (N/A)  Patient Location: PACU and Endoscopy Unit  Anesthesia Type:MAC  Level of Consciousness: awake and patient cooperative  Airway & Oxygen Therapy: Patient Spontanous Breathing and Patient connected to nasal cannula oxygen  Post-op Assessment: Report given to RN and Post -op Vital signs reviewed and stable  Post vital signs: Reviewed and stable  Last Vitals:  Filed Vitals:   03/14/15 0815 03/14/15 0928  BP: 188/74 170/68  Pulse: 68   Temp: 36.7 C   Resp: 10 14    Complications: No apparent anesthesia complications

## 2015-03-14 NOTE — Op Note (Signed)
[facility] [place], [address]   UPPER ENDOSCOPIC ULTRASOUND PROCEDURE REPORT     EXAM DATE: 03/14/2015  PATIENT NAME:          Maureen [name], Maureen [name]          MR#: [id number] BIRTHDATE:       [birth year]     VISIT #:     [id number] ATTENDING:     Arta Silence, MD     STATUS:     outpatient ASSISTANT:      Jobe Igo and Shoffner, Trevor Mace MD:  Mayra Neer, MD ASA CLASS:        ASA-II  INDICATIONS:  The patient is a 75 yr old female here for a lower endoscopic ultrasound due to gastric nodule. PROCEDURE PERFORMED:     Upper endoscopic ultrasound   MEDICATIONS:     MAC anesthesia, administered by CRNA  CONSENT: The patient understands the risks and benefits of the procedure and understands that these risks include, but are not limited to: sedation, allergic reaction, infection, perforation and/or bleeding. Alternative means of evaluation and treatment include, among others: physical exam, x-rays, and/or surgical intervention. The patient elects to proceed with this endoscopic procedure.  DESCRIPTION OF PROCEDURE: Informed consent was verified, confirmed and timeout was successfully executed by the treatment team. The patient was then placed in the left, lateral, decubitus position and IV sedation was administered. Throughout the procedure, the patients blood pressure, pulse and oxygen saturations were monitored continuously. Under direct visualization, the radial echoendoscope      was introduced through the mouth and advanced to the gastric antrum     .  Proximal gastric nodule with normal overlying mucosa again seen.  Under EUS scope, it was about 63mm x 9 mm in size, about the same size as it was two years ago.  Lesion was hypoechoic with some focal hyperechoic spots, and appears again to arise from a focal portion of the muscularis propria.  No perilesional adenopathy noted.     ADVERSE EVENTS:     None  immediate.  IMPRESSIONS:     Proximal gastric nodule, unchanged in size since last exam in 2014.  Appearance consistent with benign leiomyoma.  RECOMMENDATIONS:     1.  Watch for potential complications of procedure. 2.  No further surveillance of small gastric nodule is needed. 3.  Follow-up with Dr. Earle Gell, as needed, for any future GI concerns.  ___________________________________ Arta Silence, MD eSigned:  Arta Silence, MD 03/14/2015 9:31 AM   cc:

## 2015-03-14 NOTE — H&P (Signed)
Patient interval history reviewed.  Patient examined again.  There has been no change from documented H/P dated 03/09/15 (scanned into chart from our office) except as documented above.  Assessment:  1.  Gastric nodule.  Plan:  1.  Endoscopic ultrasound with possible fine needle aspiration (FNA) biopsies. 2.  Risks (bleeding, infection, bowel perforation that could require surgery, sedation-related changes in cardiopulmonary systems), benefits (identification and possible treatment of source of symptoms, exclusion of certain causes of symptoms), and alternatives (watchful waiting, radiographic imaging studies, empiric medical treatment) of upper endoscopy with ultrasound and possible biopsies (EUS +/- FNA) were explained to patient/family in detail and patient wishes to proceed.

## 2015-03-14 NOTE — Anesthesia Preprocedure Evaluation (Signed)
Anesthesia Evaluation  Patient identified by MRN, date of birth, ID band Patient awake    Reviewed: Allergy & Precautions, H&P , Patient's Chart, lab work & pertinent test results, reviewed documented beta blocker date and time   Airway Mallampati: II  TM Distance: >3 FB Neck ROM: full    Dental no notable dental hx.    Pulmonary    Pulmonary exam normal breath sounds clear to auscultation       Cardiovascular hypertension,  Rhythm:regular Rate:Normal     Neuro/Psych    GI/Hepatic   Endo/Other    Renal/GU      Musculoskeletal   Abdominal   Peds  Hematology   Anesthesia Other Findings   Reproductive/Obstetrics                             Anesthesia Physical Anesthesia Plan  ASA: II  Anesthesia Plan: MAC   Post-op Pain Management:    Induction: Intravenous  Airway Management Planned: Mask and Natural Airway  Additional Equipment:   Intra-op Plan:   Post-operative Plan:   Informed Consent: I have reviewed the patients History and Physical, chart, labs and discussed the procedure including the risks, benefits and alternatives for the proposed anesthesia with the patient or authorized representative who has indicated his/her understanding and acceptance.   Dental Advisory Given  Plan Discussed with: CRNA and Surgeon  Anesthesia Plan Comments: (Discussed sedation and potential to need to place airway or ETT if warranted by clinical changes intra-operatively. We will start procedure as MAC.)        Anesthesia Quick Evaluation  

## 2015-03-15 ENCOUNTER — Encounter (HOSPITAL_COMMUNITY): Payer: Self-pay | Admitting: Gastroenterology

## 2015-07-28 ENCOUNTER — Other Ambulatory Visit: Payer: Self-pay | Admitting: Adult Health

## 2015-08-02 ENCOUNTER — Ambulatory Visit (INDEPENDENT_AMBULATORY_CARE_PROVIDER_SITE_OTHER): Payer: Medicare Other | Admitting: Adult Health

## 2015-08-02 ENCOUNTER — Encounter: Payer: Self-pay | Admitting: Adult Health

## 2015-08-02 VITALS — BP 154/74 | HR 67 | Ht 62.0 in | Wt 154.4 lb

## 2015-08-02 DIAGNOSIS — Z5181 Encounter for therapeutic drug level monitoring: Secondary | ICD-10-CM

## 2015-08-02 DIAGNOSIS — R569 Unspecified convulsions: Secondary | ICD-10-CM

## 2015-08-02 MED ORDER — TEGRETOL-XR 200 MG PO TB12: 200.0000 mg | ORAL_TABLET | Freq: Two times a day (BID) | ORAL | Status: DC

## 2015-08-02 NOTE — Progress Notes (Signed)
I agree with the assessment and plan as directed by NP .The patient is known to me .   Vera Wishart, MD  

## 2015-08-02 NOTE — Progress Notes (Signed)
PATIENT: Maureen [name] DOB: 20-May-1940  REASON FOR VISIT: follow up-seizures HISTORY FROM: patient  HISTORY OF PRESENT ILLNESS: Maureen [name] is a 75 year old female with a history of seizures and meningioma. She returns today for follow-up. She states that she is doing well. She continues to take Tegretol XL and tolerating it well. Denies any seizure events. She operates a Teacher, music without difficulty. Denies any changes with her gait or balance. Able to complete all ADLs independently. She returns today for an evaluation.  HISTORY 07/11/14 (MM): Maureen [name] is a 75 year old female with a history of seizures and meningioma. She returns today for follow-up. Patient is currently taken Tegretol XL and tolerating it well. She reports that she is not had a seizure since the last visit. She is able to complete all ADLs independently. She operates a Teacher, music without difficulty. Since the last visit she does notice a mild tremor in the left hand is intermittent. She denies any changes with her gait or balance. She continues to take calcium and vitamin D supplementation for osteopenia She returns today for an evaluation.  HISTORY 05/26/13 (CD):  Maureen [name] is a 75 y.o. female seen at [facility] here as a transfer of care from [facility]. She is a retired Tourist information centre manager , with a Scientist, water quality , taught elementary school.  This patient is a left-handed Caucasian married female with a history of a left craniotomy for a left parasagittal meningioma resection on 01/04/1991. Patient has been followed him as her primary care provider by Dr. Osvaldo Human, now Dr Serita Grammes.  She has been followed by Dr. Morene Antu since July 1992 . Her last seizure is now over 10 years ago.  Preoperatively she was treated with Decadron and Phenobarbital after she developed a seizure , focal to the right body, this let to an evaluation and the discovery of the meningeoma. After the surgery she was started on Dilantin but  developed a rash and became ataxic to this medication was discontinued. She continued for while to have abnormal sensations in her right leg ans sometimes right arm , which in most respects were identified a simple partial seizures. Inspired Dr. Tressia Danas last EEG was normal. She continued to the right foot and leg motor seizures that she describes as a spasm of the right foot. Dr. Erling Cruz placed onTegretol since she had marked improvement. At the time of his last visit on 08/06/2009 the patient took brand nameTegretol-XR 200 mg twice a day she had a slight decrease in serum Na levels and 2008 underwent DEXA scan, which was normal. She still reported about once a year A symptom of leg spasm, no seizures per se. Possible symptom of spasm beginning in the right Foot and ankle up going to the right knee and then radiating downwards to the foot with a duration of less than a minute. A repeated DEXA scan revealed ion13 April 2014 newly diagnosed severe osteopenia. She begun taking vitamin D and calcium, recently in the last 6 month.   REVIEW OF SYSTEMS: Out of a complete 14 system review of symptoms, the patient complains only of the following symptoms, and all other reviewed systems are negative.  Joint pain, memory loss  ALLERGIES: Allergies  Allergen Reactions  . Dilantin [Phenytoin Sodium Extended]      Stupor, ataxia.   Marland Kitchen Hydrocodone Nausea And Vomiting    HOME MEDICATIONS: Outpatient Prescriptions Prior to Visit  Medication Sig Dispense Refill  . aspirin EC 81 MG tablet Take 81  mg by mouth daily.    . calcium-vitamin D (OSCAL WITH D) 500-200 MG-UNIT per tablet Take 1 tablet by mouth 2 (two) times daily.     . cholecalciferol (VITAMIN D) 1000 UNITS tablet Take 1,000 Units by mouth daily.    . Multiple Vitamin (MULTIVITAMIN WITH MINERALS) TABS tablet Take 1 tablet by mouth daily.    . pravastatin (PRAVACHOL) 20 MG tablet Take 20 mg by mouth daily.     . TEGRETOL-XR 200 MG 12 hr tablet Take 1  tablet (200 mg total) by mouth 2 (two) times daily. 180 tablet 3  . pravastatin (PRAVACHOL) 40 MG tablet Take 40 mg by mouth daily.     No facility-administered medications prior to visit.    PAST MEDICAL HISTORY: Past Medical History  Diagnosis Date  . Seizures ([place])     positive for HBP  . Benign tumor of meninges ([place])   . PONV (postoperative nausea and vomiting)   . Headache(784.0)   . Pneumonia     ? as child  . GERD (gastroesophageal reflux disease)     occ. in past  . HTN (hypertension), benign     pt. states no hypertension    PAST SURGICAL HISTORY: Past Surgical History  Procedure Laterality Date  . Vaginal hysterectomy  1983  . Myelominingocele repair  12/1990  . Cholecystectomy    . Esophagogastroduodenoscopy (egd) with propofol N/A 11/30/2012    Procedure: ESOPHAGOGASTRODUODENOSCOPY (EGD) WITH PROPOFOL;  Surgeon: Garlan Fair, MD;  Location: [facility];  Service: Endoscopy;  Laterality: N/A;  . Colonoscopy with propofol N/A 11/30/2012    Procedure: COLONOSCOPY WITH PROPOFOL;  Surgeon: Garlan Fair, MD;  Location: [facility];  Service: Endoscopy;  Laterality: N/A;  . Eus N/A 01/05/2013    Procedure: FULL UPPER ENDOSCOPIC ULTRASOUND (EUS) RADIAL;  Surgeon: Arta Silence, MD;  [facility]: [facility];  Service: Endoscopy;  Laterality: N/A;  EUS with possible FNA  . Fine needle aspiration N/A 01/05/2013    Procedure: FINE NEEDLE ASPIRATION (FNA) RADIAL;  Surgeon: Arta Silence, MD;  [facility];  Service: Endoscopy;  Laterality: N/A;  . Cataract extraction, bilateral Bilateral   . Eye surgery Left     "few weeks ago scar tissue laser surgery"  . Knee arthroscopy Left   . Esophagogastroduodenoscopy (egd) with propofol N/A 03/14/2015    Procedure: ESOPHAGOGASTRODUODENOSCOPY (EGD) WITH PROPOFOL;  Surgeon: Arta Silence, MD;  [facility];  Service: Endoscopy;  Laterality: N/A;  . Eus N/A 03/14/2015    Procedure: UPPER ENDOSCOPIC  ULTRASOUND (EUS) RADIAL;  Surgeon: Arta Silence, MD;  [facility];  Service: Endoscopy;  Laterality: N/A;    FAMILY HISTORY: Family History  Problem Relation Age of Onset  . Dementia Father     lewy body dementia  . Heart disease Other     PACEMAKER DEFIB.      SOCIAL HISTORY: Social History   Social History  . Marital Status: Married    Spouse Name: Jan  . Number of Children: 2  . Years of Education: Master's   Occupational History  . retired     [employer] in [place]  .     Social History Main Topics  . Smoking status: Never Smoker   . Smokeless tobacco: Never Used  . Alcohol Use: 0.0 oz/week    0 Standard drinks or equivalent per week     Comment: once monthly  . Drug Use: No  . Sexual Activity: Not on file   Other  Topics Concern  . Not on file   Social History Narrative   She retired in 1995 from [employer]. Lives at home with her husband, Jan. They have a son age 52 and a  son age 48. Cafffeine once daily. a week and alcohol once a month. Denies tobacco and illicit drug use.    Patient is left handed.   Patient has a Master's degree.      PHYSICAL EXAM  Filed Vitals:   08/02/15 1020  BP: 154/74  Pulse: 67  Height: 5\' 2"  (1.575 m)  Weight: 154 lb 6.4 oz (70.035 kg)   Body mass index is 28.23 kg/(m^2).  Generalized: Well developed, in no acute distress   Neurological examination  Mentation: Alert oriented to time, place, history taking. Follows all commands speech and language fluent Cranial nerve II-XII: Pupils were equal round reactive to light. Extraocular movements were full, visual field were full on confrontational test. Facial sensation and strength were normal. Uvula tongue midline. Head turning and shoulder shrug  were normal and symmetric. Motor: The motor testing reveals 5 over 5 strength of all 4 extremities. Good symmetric motor tone is noted throughout.  Sensory: Sensory testing is intact to soft touch  on all 4 extremities. No evidence of extinction is noted.  Coordination: Cerebellar testing reveals good finger-nose-finger and heel-to-shin bilaterally.  Gait and station: Gait is normal. Tandem gait is Slightly unsteady. Romberg is negative. No drift is seen.  Reflexes: Deep tendon reflexes are symmetric and normal bilaterally.   DIAGNOSTIC DATA (LABS, IMAGING, TESTING) - I reviewed patient records, labs, notes, testing and imaging myself where available.      ASSESSMENT AND PLAN 75 y.o. year old female  has a past medical history of Seizures ([place]); Benign tumor of meninges ([place]); PONV (postoperative nausea and vomiting); Headache(784.0); Pneumonia; GERD (gastroesophageal reflux disease); and HTN (hypertension), benign. here with:  1. Seizures  Overall the patient is doing well. She will continue on carbamazepine 200 mg twice a day. I will check blood work today patient advised that if she has any seizure event she she'll let us know. She will follow-up in one year or Dr. Brett Fairy.  Ward Givens, MSN, NP-C 08/02/2015, 10:52 AM [facility] [address], [place], [place] [address] [phone]

## 2015-08-02 NOTE — Patient Instructions (Signed)
Continue Carbamazepine °Blood work today °If you have any seizure events please let us know.  ° °

## 2015-08-03 LAB — CBC WITH DIFFERENTIAL/PLATELET
Basophils Absolute: 0 10*3/uL (ref 0.0–0.2)
Basos: 1 %
EOS (ABSOLUTE): 0 10*3/uL (ref 0.0–0.4)
Eos: 0 %
Hematocrit: 38 % (ref 34.0–46.6)
Hemoglobin: 12.7 g/dL (ref 11.1–15.9)
Immature Grans (Abs): 0 10*3/uL (ref 0.0–0.1)
Immature Granulocytes: 0 %
Lymphocytes Absolute: 1.9 10*3/uL (ref 0.7–3.1)
Lymphs: 33 %
MCH: 29.2 pg (ref 26.6–33.0)
MCHC: 33.4 g/dL (ref 31.5–35.7)
MCV: 87 fL (ref 79–97)
Monocytes Absolute: 0.5 10*3/uL (ref 0.1–0.9)
Monocytes: 8 %
Neutrophils Absolute: 3.3 10*3/uL (ref 1.4–7.0)
Neutrophils: 58 %
Platelets: 272 10*3/uL (ref 150–379)
RBC: 4.35 x10E6/uL (ref 3.77–5.28)
RDW: 14.2 % (ref 12.3–15.4)
WBC: 5.7 10*3/uL (ref 3.4–10.8)

## 2015-08-03 LAB — CARBAMAZEPINE LEVEL, TOTAL: Carbamazepine (Tegretol), S: 5.1 ug/mL (ref 4.0–12.0)

## 2015-08-03 LAB — COMPREHENSIVE METABOLIC PANEL
ALT: 19 IU/L (ref 0–32)
AST: 16 IU/L (ref 0–40)
Albumin/Globulin Ratio: 2.3 — ABNORMAL HIGH (ref 1.2–2.2)
Albumin: 4.2 g/dL (ref 3.5–4.8)
Alkaline Phosphatase: 82 IU/L (ref 39–117)
BUN/Creatinine Ratio: 27 (ref 12–28)
BUN: 18 mg/dL (ref 8–27)
Bilirubin Total: 0.2 mg/dL (ref 0.0–1.2)
CO2: 25 mmol/L (ref 18–29)
Calcium: 9.7 mg/dL (ref 8.7–10.3)
Chloride: 96 mmol/L (ref 96–106)
Creatinine, Ser: 0.66 mg/dL (ref 0.57–1.00)
GFR calc Af Amer: 99 mL/min/{1.73_m2} (ref >59)
GFR calc non Af Amer: 85 mL/min/{1.73_m2} (ref >59)
Globulin, Total: 1.8 g/dL (ref 1.5–4.5)
Glucose: 96 mg/dL (ref 65–99)
Potassium: 5.5 mmol/L — ABNORMAL HIGH (ref 3.5–5.2)
Sodium: 136 mmol/L (ref 134–144)
Total Protein: 6 g/dL (ref 6.0–8.5)

## 2015-08-03 NOTE — Progress Notes (Signed)
Quick Note:  Blood work ok. I called patient and made her aware. Potassium slightly elevated. ______

## 2016-08-04 ENCOUNTER — Encounter: Payer: Self-pay | Admitting: Neurology

## 2016-08-04 ENCOUNTER — Ambulatory Visit: Payer: Medicare Other | Admitting: Neurology

## 2016-08-06 ENCOUNTER — Telehealth: Payer: Self-pay | Admitting: Neurology

## 2016-08-06 ENCOUNTER — Other Ambulatory Visit: Payer: Self-pay | Admitting: Neurology

## 2016-08-06 MED ORDER — TEGRETOL-XR 200 MG PO TB12: 200.0000 mg | ORAL_TABLET | Freq: Two times a day (BID) | ORAL | 0 refills | Status: DC

## 2016-08-06 NOTE — Telephone Encounter (Signed)
Patient called and requested to get a refill on her medication rx Tegrotol to the [facility] on [address]. Please call and advise.

## 2016-11-10 ENCOUNTER — Ambulatory Visit: Payer: Medicare Other | Admitting: Neurology

## 2016-11-10 ENCOUNTER — Encounter: Payer: Self-pay | Admitting: Neurology

## 2016-11-10 ENCOUNTER — Ambulatory Visit (INDEPENDENT_AMBULATORY_CARE_PROVIDER_SITE_OTHER): Payer: Medicare Other | Admitting: Neurology

## 2016-11-10 VITALS — BP 144/73 | HR 76 | Ht 61.0 in | Wt 157.0 lb

## 2016-11-10 DIAGNOSIS — H532 Diplopia: Secondary | ICD-10-CM | POA: Insufficient documentation

## 2016-11-10 DIAGNOSIS — Z5181 Encounter for therapeutic drug level monitoring: Secondary | ICD-10-CM

## 2016-11-10 DIAGNOSIS — G40209 Localization-related (focal) (partial) symptomatic epilepsy and epileptic syndromes with complex partial seizures, not intractable, without status epilepticus: Secondary | ICD-10-CM

## 2016-11-10 DIAGNOSIS — D332 Benign neoplasm of brain, unspecified: Secondary | ICD-10-CM

## 2016-11-10 MED ORDER — TEGRETOL-XR 200 MG PO TB12: 200.0000 mg | ORAL_TABLET | Freq: Two times a day (BID) | ORAL | 3 refills | Status: DC

## 2016-11-10 NOTE — Progress Notes (Signed)
PATIENT: Maureen [name] DOB: [birth year]  REASON FOR VISIT: follow up-seizures HISTORY FROM: patient  HISTORY OF PRESENT ILLNESS:  Interval history from 11/10/2016 I have the pleasure of seeing [name]. 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 She denies any recent seizure events. The last possible seizure was 2002, years after her craniotomy. She reports concern of recurrence of meningeomata. She had her last MRI 20 month ago. Reviewed today the MRI study from January 2017, which documented multiple small meningiomata. These measure in the millimeter range. They could however have grown in the meanwhile. I will repeat this brain MRI study in November or December of this year    08-03-2015 MM. [name] is a 76 year old female with a history of seizures and meningioma. She returns today for follow-up. She states that she is doing well. She continues to take Tegretol XL and tolerating it well. Denies any seizure events. She operates a Teacher, music without difficulty. Denies any changes with her gait or balance. Able to complete all ADLs independently. She returns today for an evaluation.  HISTORY 07/11/14 (MM): [name] is a 76 year old female with a history of seizures and meningioma. She returns today for follow-up. Patient is currently taken Tegretol XL and tolerating it well. She reports that she is not had a seizure since the last visit. She is able to complete all ADLs independently. She operates a Teacher, music without difficulty. Since the last visit she does notice a mild tremor in the left hand is intermittent. She denies any changes with her gait or balance. She continues to take calcium and vitamin D supplementation for osteopenia She returns today for an evaluation.  HISTORY 05/26/13 (CD):  Maureen [name] is a 76 y.o. female seen at [facility] here as a transfer of care from [facility]. She is a retired Tourist information centre manager , with a Scientist, water quality , taught elementary school.  This patient is a left-handed Caucasian married female with a history of a left craniotomy for a left parasagittal meningioma resection on 01/04/1991. Patient has been followed him as her primary care provider by Dr. Osvaldo Human, now Dr Serita Grammes.  She has been followed by Dr. Morene Antu since July 1992 . Her last seizure is now over 10 years ago.  Preoperatively she was treated with Decadron and Phenobarbital after she developed a seizure , focal to the right body, this let to an evaluation and the discovery of the meningeoma. After the surgery she was started on Dilantin but developed a rash and became ataxic to this medication was discontinued. She continued for while to have abnormal sensations in her right leg ans sometimes right arm , which in most respects were identified a simple partial seizures. Inspired Dr. Tressia Danas last EEG was normal. She continued to the right foot and leg motor seizures that she describes as a spasm of the right foot. Dr. Erling Cruz placed onTegretol since she had marked improvement. At the time of his last visit on 08/06/2009 the patient took brand nameTegretol-XR 200 mg twice a day she had a slight decrease in serum Na levels and 2008 underwent DEXA scan, which was normal. She still reported about once a year A symptom of leg spasm, no seizures per se. Possible symptom of spasm beginning in the right Foot and ankle up going to the  right knee and then radiating downwards to the foot with a duration of less than a minute. A repeated DEXA scan revealed ion13 April 2014 newly diagnosed severe osteopenia. She begun taking vitamin D and calcium, recently in the last 6 month.   REVIEW OF SYSTEMS: Out of a complete 14 system review of symptoms, the patient complains only of the following symptoms, and all other  reviewed systems are negative.  Joint pain, memory loss  ALLERGIES: Allergies  Allergen Reactions  . Dilantin [Phenytoin Sodium Extended]      Stupor, ataxia.   Marland Kitchen Hydrocodone Nausea And Vomiting    HOME MEDICATIONS: Outpatient Medications Prior to Visit  Medication Sig Dispense Refill  . aspirin EC 81 MG tablet Take 81 mg by mouth daily.    . calcium-vitamin D (OSCAL WITH D) 500-200 MG-UNIT per tablet Take 1 tablet by mouth 2 (two) times daily.     . cholecalciferol (VITAMIN D) 1000 UNITS tablet Take 1,000 Units by mouth daily.    . Multiple Vitamin (MULTIVITAMIN WITH MINERALS) TABS tablet Take 1 tablet by mouth daily.    . pravastatin (PRAVACHOL) 40 MG tablet Take 20 mg by mouth daily.     . TEGRETOL-XR 200 MG 12 hr tablet Take 1 tablet (200 mg total) by mouth 2 (two) times daily. 180 tablet 0   No facility-administered medications prior to visit.     PAST MEDICAL HISTORY: Past Medical History:  Diagnosis Date  . Benign tumor of meninges ([place])   . GERD (gastroesophageal reflux disease)    occ. in past  . Headache(784.0)   . HTN (hypertension), benign    pt. states no hypertension  . Pneumonia    ? as child  . PONV (postoperative nausea and vomiting)   . Seizures ([place])    positive for HBP    PAST SURGICAL HISTORY: Past Surgical History:  Procedure Laterality Date  . CATARACT EXTRACTION, BILATERAL Bilateral   . CHOLECYSTECTOMY    . COLONOSCOPY WITH PROPOFOL N/A 11/30/2012   Procedure: COLONOSCOPY WITH PROPOFOL;  Surgeon: Garlan Fair, MD;  [facility];  Service: Endoscopy;  Laterality: N/A;  . ESOPHAGOGASTRODUODENOSCOPY (EGD) WITH PROPOFOL N/A 11/30/2012   Procedure: ESOPHAGOGASTRODUODENOSCOPY (EGD) WITH PROPOFOL;  Surgeon: Garlan Fair, MD;  [facility];  Service: Endoscopy;  Laterality: N/A;  . ESOPHAGOGASTRODUODENOSCOPY (EGD) WITH PROPOFOL N/A 03/14/2015   Procedure: ESOPHAGOGASTRODUODENOSCOPY (EGD) WITH PROPOFOL;  Surgeon: Arta Silence, MD;  [facility];  Service: Endoscopy;  Laterality: N/A;  . EUS N/A 01/05/2013   Procedure: FULL UPPER ENDOSCOPIC ULTRASOUND (EUS) RADIAL;  Surgeon: Arta Silence, MD;  [facility];  Service: Endoscopy;  Laterality: N/A;  EUS with possible FNA  . EUS N/A 03/14/2015   Procedure: UPPER ENDOSCOPIC ULTRASOUND (EUS) RADIAL;  Surgeon: Arta Silence, MD;  [facility];  Service: Endoscopy;  Laterality: N/A;  . EYE SURGERY Left    "few weeks ago scar tissue laser surgery"  . FINE NEEDLE ASPIRATION N/A 01/05/2013   Procedure: FINE NEEDLE ASPIRATION (FNA) RADIAL;  Surgeon: Arta Silence, MD;  [facility];  Service: Endoscopy;  Laterality: N/A;  . KNEE ARTHROSCOPY Left   . [place]  12/1990  . VAGINAL HYSTERECTOMY  1983    FAMILY HISTORY: Family History  Problem Relation Age of Onset  . Dementia Father        lewy body dementia  . Heart disease Other        PACEMAKER DEFIB.      SOCIAL  HISTORY: Social History   Social History  . Marital status: Married    Spouse name: Jan  . Number of children: 2  . Years of education: Master's   Occupational History  . retired     [employer] in [place]  .  Retired   Social History Main Topics  . Smoking status: Never Smoker  . Smokeless tobacco: Never Used  . Alcohol use 0.0 oz/week     Comment: once monthly  . Drug use: No  . Sexual activity: Not on file   Other Topics Concern  . Not on file   Social History Narrative   She retired in 1995 from [employer]. Lives at home with her husband, Jan. They have a son age 30 and a  son age 43. Cafffeine once daily. a week and alcohol once a month. Denies tobacco and illicit drug use.    Patient is left handed.   Patient has a Master's degree.      PHYSICAL EXAM  Vitals:   11/10/16 1543  BP: (!) 144/73  Pulse: 76  Weight: 157 lb (71.2 kg)  Height: 5\' 1"  (1.549 m)   Body mass index is 29.66  kg/m.  Generalized: Well developed, in no acute distress   Neurological examination  Mentation: Alert oriented to time, place, history taking. Follows all commands speech and language fluent Cranial nerve II-XII: Pupils were equal round reactive to light. Extraocular movements were full, visual field were full on confrontational test. Facial sensation and strength were normal. Uvula tongue midline. Head turning and shoulder shrug  were normal and symmetric. Motor: The motor testing reveals 5 over 5 strength of all 4 extremities. Good symmetric motor tone is noted throughout.  Sensory: Sensory testing is intact to soft touch on all 4 extremities. No evidence of extinction is noted.  Coordination: Cerebellar testing reveals good finger-nose-finger and heel-to-shin bilaterally.  Gait and station: Gait is normal. Tandem gait is Slightly unsteady. Romberg is negative. No drift is seen.  Reflexes: Deep tendon reflexes are symmetric and normal bilaterally.   DIAGNOSTIC DATA (LABS, IMAGING, TESTING) - I reviewed patient records, labs, notes, testing and imaging myself where available.      ASSESSMENT AND PLAN 76 y.o. year old female  has a past medical history of Benign tumor of meninges ([place]); GERD (gastroesophageal reflux disease); Headache(784.0); HTN (hypertension), benign; Pneumonia; PONV (postoperative nausea and vomiting); and Seizures ([place]). here with:  1. Remote history of seizure disorder following a transcranial resection of a meningioma. Last seizure over 15 years ago. She continues to take carbamazepine an extended release form 200 milligrams twice daily.  2. Recurrent meningeomata-  monitoring by MRI brain.    Overall the patient is doing well. She will continue on carbamazepine 200 mg twice a day. I will check blood work today patient advised that if she has any seizure event she she'll let us know. She will follow-up in January 2019 with Ward Givens, NP.  .    11/10/2016,  4:12 PM [facility] [address], [place], [place] [address] [phone]

## 2016-11-10 NOTE — Patient Instructions (Signed)
Meningioma Meningioma is a tumor that occurs in the thin tissue that covers the brain and spinal cord (meninges). Meningiomas are usually not cancerous (benign) and do not spread to other areas. In rare cases, a meningioma may become cancerous (malignant). What are the causes? In many cases, the cause of this condition is not known. In some cases, meningioma may be caused by:  Having a genetic disorder that causes multiple soft tumors (neurofibromatosis 2).  A change in certain genes (genetic mutation).  What increases the risk? You are more likely to develop this condition if:  You have been exposed to radiation.  You are an older woman. Older women have a higher risk of meningiomas than men or children. However, men have a higher risk of malignant meningiomas.  You have injured your head in the past.  You have a history of breast cancer.  What are the signs or symptoms? Symptoms of this condition usually begin very slowly. The symptoms may depend on the size and location of the tumor. Possible symptoms include:  Headaches.  Nausea and vomiting.  Vision changes.  Hearing changes.  Loss of the sense of smell.  Fits of uncontrolled movements (seizures).  Weakness or numbness on one side of the body or in an arm or leg.  Mood or personality changes.  Problems with memory or thinking.  How is this diagnosed? This condition is diagnosed based on:  Results of brain imaging tests, such as a CT scan or MRI.  Removal and testing of a sample of the tumor (biopsy). This may be done to confirm the diagnosis and to help determine the best treatment for the condition.  How is this treated? You may not have treatment until your symptoms start to affect your daily activities. This is because meningioma grows so slowly, and your health care provider may prefer to monitor its growth before starting treatment. If you do need treatment, it may include:  Medicines to decrease brain  swelling and improve symptoms (steroids).  High-energy rays (radiation therapy) to shrink or kill the tumor.  Anti-cancer medicines (chemotherapy) to shrink or kill the tumor. Chemotherapy has many side effects because it also kills healthy cells.  Targeted therapy. This kills cancerous cells without affecting normal cells.  Surgery to remove as much of the tumor as possible.  Follow these instructions at home:  Take over-the-counter and prescription medicines only as told by your health care provider.  Keep all follow-up visits as told by your health care provider. This is important. You may need regular visits to monitor the growth of your tumor. Contact a health care provider if:  You have symptoms that come back.  You have diarrhea.  You vomit.  You have abdominal pain.  You cannot eat or drink as much as you need.  You are weaker or more tired than usual.  You are losing weight without trying. Get help right away if:  Your diarrhea, vomiting, or abdominal pain does not go away.  You have new symptoms, such as vision problems or difficulty walking.  You have a seizure.  You have bleeding that does not stop.  You have trouble breathing.  You have a fever. Summary  Meningioma is a tumor that occurs in the thin tissue that covers the brain and spinal cord (meninges).  Meningiomas are usually benign, which means they are not cancerous and do not spread to other areas.  Symptoms of this condition usually begin very slowly. The symptoms may depend on the   size and location of the tumor.  Your tumor may be monitored over time. You may not need treatment until your tumor starts to affect your daily life. This information is not intended to replace advice given to you by your health care provider. Make sure you discuss any questions you have with your health care provider. Document Released: 01/11/2013 Document Revised: 01/11/2016 Document Reviewed: 01/11/2016 Elsevier  Interactive Patient Education  2017 Elsevier Inc.  

## 2016-11-11 LAB — COMPREHENSIVE METABOLIC PANEL
ALT: 20 IU/L (ref 0–32)
AST: 19 IU/L (ref 0–40)
Albumin/Globulin Ratio: 1.9 (ref 1.2–2.2)
Albumin: 4.2 g/dL (ref 3.5–4.8)
Alkaline Phosphatase: 79 IU/L (ref 39–117)
BUN/Creatinine Ratio: 30 — ABNORMAL HIGH (ref 12–28)
BUN: 22 mg/dL (ref 8–27)
Bilirubin Total: <0.2 mg/dL (ref 0.0–1.2)
CO2: 30 mmol/L — ABNORMAL HIGH (ref 20–29)
Calcium: 9.7 mg/dL (ref 8.7–10.3)
Chloride: 98 mmol/L (ref 96–106)
Creatinine, Ser: 0.73 mg/dL (ref 0.57–1.00)
GFR calc Af Amer: 91 mL/min/{1.73_m2} (ref >59)
GFR calc non Af Amer: 79 mL/min/{1.73_m2} (ref >59)
Globulin, Total: 2.2 g/dL (ref 1.5–4.5)
Glucose: 134 mg/dL — ABNORMAL HIGH (ref 65–99)
Potassium: 5 mmol/L (ref 3.5–5.2)
Sodium: 139 mmol/L (ref 134–144)
Total Protein: 6.4 g/dL (ref 6.0–8.5)

## 2016-11-11 LAB — CBC
Hematocrit: 39.5 % (ref 34.0–46.6)
Hemoglobin: 13.2 g/dL (ref 11.1–15.9)
MCH: 29.7 pg (ref 26.6–33.0)
MCHC: 33.4 g/dL (ref 31.5–35.7)
MCV: 89 fL (ref 79–97)
Platelets: 284 10*3/uL (ref 150–379)
RBC: 4.45 x10E6/uL (ref 3.77–5.28)
RDW: 13.6 % (ref 12.3–15.4)
WBC: 6.6 10*3/uL (ref 3.4–10.8)

## 2016-11-12 ENCOUNTER — Telehealth: Payer: Self-pay | Admitting: Neurology

## 2016-11-12 NOTE — Telephone Encounter (Signed)
-----   Message from Larey Seat, MD sent at 11/11/2016  4:11 PM EDT ----- Mildly increased creatinine.

## 2016-11-12 NOTE — Telephone Encounter (Signed)
Called pt to make aware of the lab results. Pt states that she would follow up with PCP about mild elevated BUN/Creatinine ratio. Pt verbalized understanding. Pt had no questions at this time but was encouraged to call back if questions arise.

## 2016-11-20 ENCOUNTER — Other Ambulatory Visit: Payer: PRIVATE HEALTH INSURANCE

## 2016-12-08 ENCOUNTER — Ambulatory Visit
Admission: RE | Admit: 2016-12-08 | Discharge: 2016-12-08 | Disposition: A | Discharge status: Home / Self Care | Payer: Medicare Other | Source: Ambulatory Visit | Attending: Neurology | Admitting: Neurology

## 2016-12-08 DIAGNOSIS — Z5181 Encounter for therapeutic drug level monitoring: Secondary | ICD-10-CM

## 2016-12-08 DIAGNOSIS — H532 Diplopia: Secondary | ICD-10-CM

## 2016-12-08 DIAGNOSIS — G40209 Localization-related (focal) (partial) symptomatic epilepsy and epileptic syndromes with complex partial seizures, not intractable, without status epilepticus: Secondary | ICD-10-CM | POA: Diagnosis not present

## 2016-12-08 DIAGNOSIS — D332 Benign neoplasm of brain, unspecified: Secondary | ICD-10-CM

## 2016-12-08 MED ORDER — GADOBENATE DIMEGLUMINE 529 MG/ML IV SOLN
14.0000 mL | Freq: Once | INTRAVENOUS | Status: AC | PRN
  Administered 2016-12-08: 14 mL via INTRAVENOUS

## 2016-12-09 ENCOUNTER — Telehealth: Payer: Self-pay

## 2016-12-09 NOTE — Telephone Encounter (Signed)
Left vm for patient to call back about MRI brain results. ------ 

## 2016-12-15 NOTE — Telephone Encounter (Signed)
Rn call patient that the [place] brain was unchanged from 01/2015. Same abnormalities noted.No progression, no new lesions, no return on the meningeoma. Pt verbalized understanding. ------

## 2017-02-24 ENCOUNTER — Telehealth: Payer: Self-pay | Admitting: Neurology

## 2017-02-24 NOTE — Telephone Encounter (Signed)
Pt had an appt schedule for 02/26/17 at 9:30 (with [facility]) she canceled through the automatic phone call but didn't mean too. When pt reached out she said she never canceled it, when I tried to explain to her what happened she told me that she didn't cancel it and was still coming at that time then hung up. The 9:30 appt with Jinny Blossom has been taken

## 2017-02-24 NOTE — Telephone Encounter (Signed)
LMVM for pt on her home #.  Appt on 02-26-17, see note below.  I have the 0730 appt on 02-26-17 on hold for her is this is ok, unfortunately any other will be later, next available or cancellation.  I told her to call back and I will be glad to speak to her since she is my neighbor.

## 2017-02-25 NOTE — Telephone Encounter (Signed)
Pt returning RN's call.

## 2017-02-25 NOTE — Telephone Encounter (Signed)
I spoke to pt and she will come at 0730 on 02-26-17 for appt.  Be here 0715 for check in and she verbalized understanding.

## 2017-02-26 ENCOUNTER — Ambulatory Visit: Payer: Medicare Other | Admitting: Adult Health

## 2017-02-26 ENCOUNTER — Encounter (INDEPENDENT_AMBULATORY_CARE_PROVIDER_SITE_OTHER): Payer: Self-pay

## 2017-02-26 ENCOUNTER — Encounter: Payer: Self-pay | Admitting: Adult Health

## 2017-02-26 VITALS — BP 149/81 | HR 73 | Ht 61.0 in | Wt 154.8 lb

## 2017-02-26 DIAGNOSIS — R569 Unspecified convulsions: Secondary | ICD-10-CM

## 2017-02-26 DIAGNOSIS — Z86011 Personal history of benign neoplasm of the brain: Secondary | ICD-10-CM

## 2017-02-26 DIAGNOSIS — Z5181 Encounter for therapeutic drug level monitoring: Secondary | ICD-10-CM

## 2017-02-26 NOTE — Progress Notes (Signed)
PATIENT: Maureen [name] DOB: [birth year]  REASON FOR VISIT: follow up- seizures HISTORY FROM: patient  HISTORY OF PRESENT ILLNESS: Today 02/26/17 Maureen [name] is a 77 year old female with a history of seizures.  She returns today for follow-up.  She denies any seizure events.  She continues on Tegretol and is tolerating it well.  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  Her most recent MRI of the brain showed that the meningioma is stable.  She returns today for an evaluation.  HISTORY 11/10/2016 I have the pleasure of seeing Maureen [name]. 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 She denies any recent seizure events. The last possible seizure was 2002, years after her craniotomy. She reports concern of recurrence of meningeomata. She had her last MRI 20 month ago. Reviewed today the MRI study from January 2017, which documented multiple small meningiomata. These measure in the millimeter range. They could however have grown in the meanwhile. I will repeat this brain MRI study in November or December of this year  REVIEW OF SYSTEMS: Out of a complete 14 system review of symptoms, the patient complains only of the following symptoms, and all other reviewed systems are negative.  See HPI  ALLERGIES: Allergies  Allergen Reactions  . Dilantin [Phenytoin Sodium Extended]      Stupor, ataxia.   Marland Kitchen Hydrocodone Nausea And Vomiting    HOME MEDICATIONS: Outpatient Medications Prior to Visit  Medication Sig Dispense Refill  . aspirin EC 81 MG tablet Take 81 mg by mouth daily.    . calcium-vitamin D (OSCAL WITH D) 500-200 MG-UNIT per tablet Take 1 tablet by mouth 2 (two) times daily.     . cholecalciferol (VITAMIN D) 1000 UNITS tablet Take 1,000 Units by mouth daily.    .  Multiple Vitamin (MULTIVITAMIN WITH MINERALS) TABS tablet Take 1 tablet by mouth daily.    . pravastatin (PRAVACHOL) 40 MG tablet Take 20 mg by mouth daily.     . TEGRETOL-XR 200 MG 12 hr tablet Take 1 tablet (200 mg total) by mouth 2 (two) times daily. 180 tablet 3   No facility-administered medications prior to visit.     PAST MEDICAL HISTORY: Past Medical History:  Diagnosis Date  . Benign tumor of meninges ([employer])   . GERD (gastroesophageal reflux disease)    occ. in past  . Headache(784.0)   . HTN (hypertension), benign    pt. states no hypertension  . Pneumonia    ? as child  . PONV (postoperative nausea and vomiting)   . Seizures ([place])    positive for HBP    PAST SURGICAL HISTORY: Past Surgical History:  Procedure Laterality Date  . CATARACT EXTRACTION, BILATERAL Bilateral   . CHOLECYSTECTOMY    . COLONOSCOPY WITH PROPOFOL N/A 11/30/2012   Procedure: COLONOSCOPY WITH PROPOFOL;  Surgeon: Garlan Fair, MD;  Location: [facility];  Service: Endoscopy;  Laterality: N/A;  . ESOPHAGOGASTRODUODENOSCOPY (EGD) WITH PROPOFOL N/A 11/30/2012   Procedure: ESOPHAGOGASTRODUODENOSCOPY (EGD) WITH PROPOFOL;  Surgeon: Garlan Fair, MD;  [facility];  Service: Endoscopy;  Laterality: N/A;  . ESOPHAGOGASTRODUODENOSCOPY (EGD) WITH PROPOFOL N/A 03/14/2015   Procedure: ESOPHAGOGASTRODUODENOSCOPY (EGD) WITH PROPOFOL;  Surgeon: Arta Silence, MD;  [facility];  Service: Endoscopy;  Laterality: N/A;  . EUS N/A 01/05/2013  Procedure: FULL UPPER ENDOSCOPIC ULTRASOUND (EUS) RADIAL;  Surgeon: Arta Silence, MD;  [facility];  Service: Endoscopy;  Laterality: N/A;  EUS with possible FNA  . EUS N/A 03/14/2015   Procedure: UPPER ENDOSCOPIC ULTRASOUND (EUS) RADIAL;  Surgeon: Arta Silence, MD;  [facility];  Service: Endoscopy;  Laterality: N/A;  . EYE SURGERY Left    "few weeks ago scar tissue laser surgery"  . FINE NEEDLE ASPIRATION N/A 01/05/2013    Procedure: FINE NEEDLE ASPIRATION (FNA) RADIAL;  Surgeon: Arta Silence, MD;  [facility];  Service: Endoscopy;  Laterality: N/A;  . KNEE ARTHROSCOPY Left   . [place]  12/1990  . VAGINAL HYSTERECTOMY  1983    FAMILY HISTORY: Family History  Problem Relation Age of Onset  . Dementia Father        lewy body dementia  . Heart disease Other        PACEMAKER DEFIB.      SOCIAL HISTORY: Social History   Socioeconomic History  . Marital status: Married    Spouse name: Jan  . Number of children: 2  . Years of education: Master's  . Highest education level: Not on file  Social Needs  . Financial resource strain: Not on file  . Food insecurity - worry: Not on file  . Food insecurity - inability: Not on file  . Transportation needs - medical: Not on file  . Transportation needs - non-medical: Not on file  Occupational History  . Occupation: retired    Comment: [employer] in [place]: RETIRED  Tobacco Use  . Smoking status: Never Smoker  . Smokeless tobacco: Never Used  Substance and Sexual Activity  . Alcohol use: Yes    Alcohol/week: 0.0 oz    Comment: once monthly  . Drug use: No  . Sexual activity: Not on file  Other Topics Concern  . Not on file  Social History Narrative   She retired in 1995 from [employer]. Lives at home with her husband, Jan. They have a son age 24 and a  son age 26. Cafffeine once daily. a week and alcohol once a month. Denies tobacco and illicit drug use.    Patient is left handed.   Patient has a Master's degree.      PHYSICAL EXAM  Vitals:   02/26/17 1004  BP: (!) 149/81  Pulse: 73  Weight: 154 lb 12.8 oz (70.2 kg)  Height: 5\' 1"  (1.549 m)   Body mass index is 29.25 kg/m.  Generalized: Well developed, in no acute distress   Neurological examination  Mentation: Alert oriented to time, place, history taking. Follows all commands speech and language fluent Cranial nerve  II-XII: Pupils were equal round reactive to light. Extraocular movements were full, visual field were full on confrontational test. Facial sensation and strength were normal. Uvula tongue midline. Head turning and shoulder shrug  were normal and symmetric. Motor: The motor testing reveals 5 over 5 strength of all 4 extremities. Good symmetric motor tone is noted throughout.  Sensory: Sensory testing is intact to soft touch on all 4 extremities. No evidence of extinction is noted.  Coordination: Cerebellar testing reveals good finger-nose-finger and heel-to-shin bilaterally.  Gait and station: Gait is normal. Tandem gait is normal. Romberg is negative. No drift is seen.  Reflexes: Deep tendon reflexes are symmetric and normal bilaterally.   DIAGNOSTIC DATA (LABS, IMAGING, TESTING) - I reviewed patient records, labs, notes, testing and imaging myself where  available.  Lab Results  Component Value Date   WBC 6.6 11/10/2016   HGB 13.2 11/10/2016   HCT 39.5 11/10/2016   MCV 89 11/10/2016   PLT 284 11/10/2016      Component Value Date/Time   NA 139 11/10/2016 1649   K 5.0 11/10/2016 1649   CL 98 11/10/2016 1649   CO2 30 (H) 11/10/2016 1649   GLUCOSE 134 (H) 11/10/2016 1649   BUN 22 11/10/2016 1649   CREATININE 0.73 11/10/2016 1649   CALCIUM 9.7 11/10/2016 1649   PROT 6.4 11/10/2016 1649   ALBUMIN 4.2 11/10/2016 1649   AST 19 11/10/2016 1649   ALT 20 11/10/2016 1649   ALKPHOS 79 11/10/2016 1649   BILITOT <0.2 11/10/2016 1649   GFRNONAA 79 11/10/2016 1649   GFRAA 91 11/10/2016 1649      ASSESSMENT AND PLAN 77 y.o. year old female  has a past medical history of Benign tumor of meninges ([place]), GERD (gastroesophageal reflux disease), Headache(784.0), HTN (hypertension), benign, Pneumonia, PONV (postoperative nausea and vomiting), and Seizures ([place]). here with:  1. Seizures  Overall the patient is doing well.  She will continue on Tegretol extended release.  I will check blood work  today.  Her MRI was stable.  She is advised that if her symptoms worsen or she develops new symptoms she should let us know.  He will follow-up in 1 year or sooner if needed.   Ward Givens, MSN, NP-C 02/26/2017, 10:52 AM [facility] [address] [place], [place] [address] [phone]

## 2017-02-26 NOTE — Progress Notes (Signed)
I agree with the assessment and plan as directed by NP .The patient is known to me .   Wallis Spizzirri, MD  

## 2017-02-26 NOTE — Patient Instructions (Signed)
Your Plan:  Continue tegretol  Blood work today If your symptoms worsen or you develop new symptoms please let us know.       Thank you for coming to see us at [facility]. I hope we have been able to provide you high quality care today.  You may receive a patient satisfaction survey over the next few weeks. We would appreciate your feedback and comments so that we may continue to improve ourselves and the health of our patients.  

## 2017-02-27 LAB — COMPREHENSIVE METABOLIC PANEL
ALT: 17 IU/L (ref 0–32)
AST: 17 IU/L (ref 0–40)
Albumin/Globulin Ratio: 2.1 (ref 1.2–2.2)
Albumin: 4.5 g/dL (ref 3.5–4.8)
Alkaline Phosphatase: 77 IU/L (ref 39–117)
BUN/Creatinine Ratio: 22 (ref 12–28)
BUN: 16 mg/dL (ref 8–27)
Bilirubin Total: <0.2 mg/dL (ref 0.0–1.2)
CO2: 26 mmol/L (ref 20–29)
Calcium: 9.9 mg/dL (ref 8.7–10.3)
Chloride: 99 mmol/L (ref 96–106)
Creatinine, Ser: 0.73 mg/dL (ref 0.57–1.00)
GFR calc Af Amer: 91 mL/min/{1.73_m2} (ref >59)
GFR calc non Af Amer: 79 mL/min/{1.73_m2} (ref >59)
Globulin, Total: 2.1 g/dL (ref 1.5–4.5)
Glucose: 94 mg/dL (ref 65–99)
Potassium: 4.6 mmol/L (ref 3.5–5.2)
Sodium: 140 mmol/L (ref 134–144)
Total Protein: 6.6 g/dL (ref 6.0–8.5)

## 2017-02-27 LAB — CBC WITH DIFFERENTIAL/PLATELET
Basophils Absolute: 0 10*3/uL (ref 0.0–0.2)
Basos: 0 %
EOS (ABSOLUTE): 0 10*3/uL (ref 0.0–0.4)
Eos: 0 %
Hematocrit: 38.5 % (ref 34.0–46.6)
Hemoglobin: 12.9 g/dL (ref 11.1–15.9)
Immature Grans (Abs): 0 10*3/uL (ref 0.0–0.1)
Immature Granulocytes: 0 %
Lymphocytes Absolute: 1.6 10*3/uL (ref 0.7–3.1)
Lymphs: 28 %
MCH: 29.3 pg (ref 26.6–33.0)
MCHC: 33.5 g/dL (ref 31.5–35.7)
MCV: 87 fL (ref 79–97)
Monocytes Absolute: 0.8 10*3/uL (ref 0.1–0.9)
Monocytes: 13 %
Neutrophils Absolute: 3.4 10*3/uL (ref 1.4–7.0)
Neutrophils: 59 %
Platelets: 273 10*3/uL (ref 150–379)
RBC: 4.41 x10E6/uL (ref 3.77–5.28)
RDW: 14.1 % (ref 12.3–15.4)
WBC: 5.8 10*3/uL (ref 3.4–10.8)

## 2017-02-27 LAB — CARBAMAZEPINE LEVEL, TOTAL: Carbamazepine (Tegretol), S: 3.4 ug/mL — ABNORMAL LOW (ref 4.0–12.0)

## 2017-03-02 ENCOUNTER — Telehealth: Payer: Self-pay | Admitting: *Deleted

## 2017-03-02 NOTE — Telephone Encounter (Signed)
I spoke to pt and relayed that the lab results are unremarkable. She verbalized understanding.

## 2017-03-02 NOTE — Telephone Encounter (Signed)
-----   Message from Ward Givens, NP sent at 03/02/2017  7:28 AM EST ----- Lab work in unremarkable. Please call patient.

## 2017-08-31 ENCOUNTER — Ambulatory Visit: Payer: Medicare Other | Admitting: Adult Health

## 2017-08-31 ENCOUNTER — Encounter: Payer: Self-pay | Admitting: Adult Health

## 2017-08-31 VITALS — BP 132/82 | HR 64 | Ht 61.0 in | Wt 154.4 lb

## 2017-08-31 DIAGNOSIS — Z5181 Encounter for therapeutic drug level monitoring: Secondary | ICD-10-CM | POA: Diagnosis not present

## 2017-08-31 DIAGNOSIS — Z86011 Personal history of benign neoplasm of the brain: Secondary | ICD-10-CM

## 2017-08-31 DIAGNOSIS — R569 Unspecified convulsions: Secondary | ICD-10-CM

## 2017-08-31 MED ORDER — TEGRETOL-XR 200 MG PO TB12: 200.0000 mg | ORAL_TABLET | Freq: Two times a day (BID) | ORAL | 3 refills | Status: DC

## 2017-08-31 NOTE — Patient Instructions (Signed)
Your Plan:  Continue carbamazepine Blood work today If your symptoms worsen or you develop new symptoms please let us know.    Thank you for coming to see us at [facility]. I hope we have been able to provide you high quality care today.  You may receive a patient satisfaction survey over the next few weeks. We would appreciate your feedback and comments so that we may continue to improve ourselves and the health of our patients.  

## 2017-08-31 NOTE — Progress Notes (Signed)
PATIENT: Maureen [name] DOB: 1940-12-17  REASON FOR VISIT: follow up HISTORY FROM: patient  HISTORY OF PRESENT ILLNESS: Today 08/31/17: Maureen [name] is a 77 year old female with a history of seizures.  She returns today for follow-up.  She states that she is doing well.  She continues on Tegretol.  Denies any seizure events.  No change in her gait or balance.  No falls.  No change in her mood or behavior.  She does report that throughout the winter she had several nosebleeds.  She has not discussed this with her primary care provider.  He feels this may be related to the dry heat in her home.  She states that she has had some bloody noses during the summer as well.  Her last MRI of the brain was stable.  She returns today for evaluation.  HISTORY 02/26/17 Maureen [name] is a 77 year old female with a history of seizures.  She returns today for follow-up.  She denies any seizure events.  She continues on Tegretol and is tolerating it well.  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  Her most recent MRI of the brain showed that the meningioma is stable.  She returns today for an evaluation.  REVIEW OF SYSTEMS: Out of a complete 14 system review of symptoms, the patient complains only of the following symptoms, and all other reviewed systems are negative.  Insomnia, frequent waking, diarrhea, frequency of urination, cold, heat intolerance, drooling  ALLERGIES: Allergies  Allergen Reactions  . Dilantin [Phenytoin Sodium Extended]      Stupor, ataxia.   Marland Kitchen Hydrocodone Nausea And Vomiting    HOME MEDICATIONS: Outpatient Medications Prior to Visit  Medication Sig Dispense Refill  . aspirin EC 81 MG tablet Take 81 mg by mouth daily.    . calcium-vitamin D (OSCAL WITH D) 500-200 MG-UNIT per tablet Take 1 tablet by mouth 2 (two) times daily.     . cholecalciferol (VITAMIN D) 1000 UNITS tablet Take 1,000 Units by mouth daily.    . Multiple Vitamin  (MULTIVITAMIN WITH MINERALS) TABS tablet Take 1 tablet by mouth daily.    . pravastatin (PRAVACHOL) 40 MG tablet Take 20 mg by mouth daily.     . TEGRETOL-XR 200 MG 12 hr tablet Take 1 tablet (200 mg total) by mouth 2 (two) times daily. 180 tablet 3   No facility-administered medications prior to visit.     PAST MEDICAL HISTORY: Past Medical History:  Diagnosis Date  . Benign tumor of meninges ([place])   . GERD (gastroesophageal reflux disease)    occ. in past  . Headache(784.0)   . HTN (hypertension), benign    pt. states no hypertension  . Pneumonia    ? as child  . PONV (postoperative nausea and vomiting)   . Seizures ([place])    positive for HBP    PAST SURGICAL HISTORY: Past Surgical History:  Procedure Laterality Date  . CATARACT EXTRACTION, BILATERAL Bilateral   . CHOLECYSTECTOMY    . COLONOSCOPY WITH PROPOFOL N/A 11/30/2012   Procedure: COLONOSCOPY WITH PROPOFOL;  Surgeon: Garlan Fair, MD;  [facility];  Service: Endoscopy;  Laterality: N/A;  . ESOPHAGOGASTRODUODENOSCOPY (EGD) WITH PROPOFOL N/A 11/30/2012   Procedure: ESOPHAGOGASTRODUODENOSCOPY (EGD) WITH PROPOFOL;  Surgeon: Garlan Fair, MD;  [facility];  Service: Endoscopy;  Laterality: N/A;  . ESOPHAGOGASTRODUODENOSCOPY (EGD) WITH PROPOFOL N/A 03/14/2015   Procedure: ESOPHAGOGASTRODUODENOSCOPY (EGD) WITH PROPOFOL;  Surgeon: Arta Silence, MD;  [facility];  Service: Endoscopy;  Laterality: N/A;  . EUS N/A 01/05/2013   Procedure: FULL UPPER ENDOSCOPIC ULTRASOUND (EUS) RADIAL;  Surgeon: Arta Silence, MD;  [facility];  Service: Endoscopy;  Laterality: N/A;  EUS with possible FNA  . EUS N/A 03/14/2015   Procedure: UPPER ENDOSCOPIC ULTRASOUND (EUS) RADIAL;  Surgeon: Arta Silence, MD;  [facility];  Service: Endoscopy;  Laterality: N/A;  . EYE SURGERY Left    "few weeks ago scar tissue laser surgery"  . FINE NEEDLE ASPIRATION N/A 01/05/2013   Procedure: FINE  NEEDLE ASPIRATION (FNA) RADIAL;  Surgeon: Arta Silence, MD;  [facility];  Service: Endoscopy;  Laterality: N/A;  . KNEE ARTHROSCOPY Left   . [place]  12/1990  . VAGINAL HYSTERECTOMY  1983    FAMILY HISTORY: Family History  Problem Relation Age of Onset  . Dementia Father        lewy body dementia  . Heart disease Other        PACEMAKER DEFIB.      SOCIAL HISTORY: Social History   Socioeconomic History  . Marital status: Married    Spouse name: Jan  . Number of children: 2  . Years of education: Master's  . Highest education level: Not on file  Occupational History  . Occupation: retired    Comment: [employer] in [place]: [employer]  . Financial resource strain: Not on file  . Food insecurity:    Worry: Not on file    Inability: Not on file  . Transportation needs:    Medical: Not on file    Non-medical: Not on file  Tobacco Use  . Smoking status: Never Smoker  . Smokeless tobacco: Never Used  Substance and Sexual Activity  . Alcohol use: Yes    Alcohol/week: 0.0 standard drinks    Comment: once monthly  . Drug use: No  . Sexual activity: Not on file  Lifestyle  . Physical activity:    Days per week: Not on file    Minutes per session: Not on file  . Stress: Not on file  Relationships  . Social connections:    Talks on phone: Not on file    Gets together: Not on file    Attends religious service: Not on file    Active member of club or organization: Not on file    Attends meetings of clubs or organizations: Not on file    Relationship status: Not on file  . Intimate partner violence:    Fear of current or ex partner: Not on file    Emotionally abused: Not on file    Physically abused: Not on file    Forced sexual activity: Not on file  Other Topics Concern  . Not on file  Social History Narrative   She retired in 1995 from [employer]. Lives at home with her husband, Jan. They  have a son age 72 and a  son age 25. Cafffeine once daily. a week and alcohol once a month. Denies tobacco and illicit drug use.    Patient is left handed.   Patient has a Master's degree.      PHYSICAL EXAM  Vitals:   08/31/17 1413  BP: 132/82  Pulse: 64  Weight: 154 lb 6 oz (70 kg)  Height: 5\' 1"  (1.549 m)   Body mass index is 29.17 kg/m.  Generalized: Well developed, in no acute distress   Neurological examination  Mentation:  Alert oriented to time, place, history taking. Follows all commands speech and language fluent Cranial nerve II-XII: Pupils were equal round reactive to light. Extraocular movements were full, visual field were full on confrontational test. Facial sensation and strength were normal. Uvula tongue midline. Head turning and shoulder shrug  were normal and symmetric. Motor: The motor testing reveals 5 over 5 strength of all 4 extremities. Good symmetric motor tone is noted throughout.  Sensory: Sensory testing is intact to soft touch on all 4 extremities. No evidence of extinction is noted.  Coordination: Cerebellar testing reveals good finger-nose-finger and heel-to-shin bilaterally.  Gait and station: Gait is normal.  Tandem gait is unsteady.  Romberg is negative but slightly unsteady Reflexes: Deep tendon reflexes are symmetric and normal bilaterally.   DIAGNOSTIC DATA (LABS, IMAGING, TESTING) - I reviewed patient records, labs, notes, testing and imaging myself where available.  Lab Results  Component Value Date   WBC 5.8 02/26/2017   HGB 12.9 02/26/2017   HCT 38.5 02/26/2017   MCV 87 02/26/2017   PLT 273 02/26/2017      Component Value Date/Time   NA 140 02/26/2017 1107   K 4.6 02/26/2017 1107   CL 99 02/26/2017 1107   CO2 26 02/26/2017 1107   GLUCOSE 94 02/26/2017 1107   BUN 16 02/26/2017 1107   CREATININE 0.73 02/26/2017 1107   CALCIUM 9.9 02/26/2017 1107   PROT 6.6 02/26/2017 1107   ALBUMIN 4.5 02/26/2017 1107   AST 17 02/26/2017 1107     ALT 17 02/26/2017 1107   ALKPHOS 77 02/26/2017 1107   BILITOT <0.2 02/26/2017 1107   GFRNONAA 79 02/26/2017 1107   GFRAA 91 02/26/2017 1107      ASSESSMENT AND PLAN 77 y.o. year old female  has a past medical history of Benign tumor of meninges ([place]), GERD (gastroesophageal reflux disease), Headache(784.0), HTN (hypertension), benign, Pneumonia, PONV (postoperative nausea and vomiting), and Seizures ([place]). here with:  1.  Seizures 2.  History of meningioma  Overall the patient is doing well.  She will continue on Tegretol.  I will check blood work today.  I advised that if she continues to have nosebleeds she should discuss this with her primary care provider.  If she has any seizure events she should let us know.  The patient's last MRI of the brain was stable.  She will follow-up in 6 months or sooner if needed.    Ward Givens, MSN, NP-C 08/31/2017, 2:28 PM [facility] [address] [place], [place] [address] [phone]

## 2017-09-01 ENCOUNTER — Telehealth: Payer: Self-pay

## 2017-09-01 LAB — CBC WITH DIFFERENTIAL/PLATELET
Basophils Absolute: 0 10*3/uL (ref 0.0–0.2)
Basos: 0 %
EOS (ABSOLUTE): 0 10*3/uL (ref 0.0–0.4)
Eos: 0 %
Hematocrit: 39.3 % (ref 34.0–46.6)
Hemoglobin: 13.3 g/dL (ref 11.1–15.9)
Immature Grans (Abs): 0 10*3/uL (ref 0.0–0.1)
Immature Granulocytes: 0 %
Lymphocytes Absolute: 1.9 10*3/uL (ref 0.7–3.1)
Lymphs: 31 %
MCH: 29 pg (ref 26.6–33.0)
MCHC: 33.8 g/dL (ref 31.5–35.7)
MCV: 86 fL (ref 79–97)
Monocytes Absolute: 0.6 10*3/uL (ref 0.1–0.9)
Monocytes: 10 %
Neutrophils Absolute: 3.6 10*3/uL (ref 1.4–7.0)
Neutrophils: 59 %
Platelets: 311 10*3/uL (ref 150–450)
RBC: 4.59 x10E6/uL (ref 3.77–5.28)
RDW: 13.9 % (ref 12.3–15.4)
WBC: 6.2 10*3/uL (ref 3.4–10.8)

## 2017-09-01 LAB — COMPREHENSIVE METABOLIC PANEL
ALT: 19 IU/L (ref 0–32)
AST: 19 IU/L (ref 0–40)
Albumin/Globulin Ratio: 2.4 — ABNORMAL HIGH (ref 1.2–2.2)
Albumin: 4.5 g/dL (ref 3.5–4.8)
Alkaline Phosphatase: 88 IU/L (ref 39–117)
BUN/Creatinine Ratio: 31 — ABNORMAL HIGH (ref 12–28)
BUN: 19 mg/dL (ref 8–27)
Bilirubin Total: <0.2 mg/dL (ref 0.0–1.2)
CO2: 28 mmol/L (ref 20–29)
Calcium: 10 mg/dL (ref 8.7–10.3)
Chloride: 100 mmol/L (ref 96–106)
Creatinine, Ser: 0.62 mg/dL (ref 0.57–1.00)
GFR calc Af Amer: 99 mL/min/{1.73_m2} (ref >59)
GFR calc non Af Amer: 86 mL/min/{1.73_m2} (ref >59)
Globulin, Total: 1.9 g/dL (ref 1.5–4.5)
Glucose: 101 mg/dL — ABNORMAL HIGH (ref 65–99)
Potassium: 4.6 mmol/L (ref 3.5–5.2)
Sodium: 140 mmol/L (ref 134–144)
Total Protein: 6.4 g/dL (ref 6.0–8.5)

## 2017-09-01 LAB — CARBAMAZEPINE LEVEL, TOTAL: Carbamazepine (Tegretol), S: 5.3 ug/mL (ref 4.0–12.0)

## 2017-09-01 NOTE — Telephone Encounter (Signed)
I contacted the patient back and advised of lab results stated in the note. Patient voiced understanding and had no further questions at this time.

## 2017-09-01 NOTE — Telephone Encounter (Signed)
Patient returning your call.

## 2017-09-01 NOTE — Telephone Encounter (Signed)
-----   Message from Ward Givens, NP sent at 09/01/2017  8:21 AM EDT ----- Blood work is relatively unremarkable.  Please call patient with results

## 2017-09-01 NOTE — Telephone Encounter (Signed)
Patient was unavailable to discuss lab results. Requested a call back to further discuss.

## 2017-09-17 DIAGNOSIS — R040 Epistaxis: Secondary | ICD-10-CM | POA: Insufficient documentation

## 2017-11-13 ENCOUNTER — Other Ambulatory Visit: Payer: Self-pay | Admitting: Family Medicine

## 2017-11-13 ENCOUNTER — Ambulatory Visit
Admission: RE | Admit: 2017-11-13 | Discharge: 2017-11-13 | Disposition: A | Discharge status: Home / Self Care | Payer: Medicare Other | Source: Ambulatory Visit | Attending: Family Medicine | Admitting: Family Medicine

## 2017-11-13 DIAGNOSIS — R059 Cough, unspecified: Secondary | ICD-10-CM

## 2017-11-13 DIAGNOSIS — R05 Cough: Secondary | ICD-10-CM

## 2017-11-17 ENCOUNTER — Other Ambulatory Visit: Payer: Self-pay | Admitting: Family Medicine

## 2017-11-17 DIAGNOSIS — R9389 Abnormal findings on diagnostic imaging of other specified body structures: Secondary | ICD-10-CM

## 2017-11-18 ENCOUNTER — Ambulatory Visit
Admission: RE | Admit: 2017-11-18 | Discharge: 2017-11-18 | Disposition: A | Discharge status: Home / Self Care | Payer: Medicare Other | Source: Ambulatory Visit | Attending: Family Medicine | Admitting: Family Medicine

## 2017-11-18 DIAGNOSIS — R9389 Abnormal findings on diagnostic imaging of other specified body structures: Secondary | ICD-10-CM

## 2017-11-18 MED ORDER — IOPAMIDOL (ISOVUE-300) INJECTION 61%
75.0000 mL | Freq: Once | INTRAVENOUS | Status: AC | PRN
  Administered 2017-11-18: 75 mL via INTRAVENOUS

## 2017-11-19 ENCOUNTER — Other Ambulatory Visit: Payer: Self-pay | Admitting: Family Medicine

## 2017-11-19 DIAGNOSIS — E041 Nontoxic single thyroid nodule: Secondary | ICD-10-CM

## 2017-11-24 ENCOUNTER — Ambulatory Visit
Admission: RE | Admit: 2017-11-24 | Discharge: 2017-11-24 | Disposition: A | Discharge status: Home / Self Care | Payer: Medicare Other | Source: Ambulatory Visit | Attending: Family Medicine | Admitting: Family Medicine

## 2017-11-24 DIAGNOSIS — E041 Nontoxic single thyroid nodule: Secondary | ICD-10-CM

## 2017-11-27 ENCOUNTER — Other Ambulatory Visit: Payer: Self-pay | Admitting: Family Medicine

## 2017-11-27 DIAGNOSIS — E041 Nontoxic single thyroid nodule: Secondary | ICD-10-CM

## 2017-12-08 ENCOUNTER — Ambulatory Visit
Admission: RE | Admit: 2017-12-08 | Discharge: 2017-12-08 | Disposition: A | Discharge status: Home / Self Care | Payer: Medicare Other | Source: Ambulatory Visit | Attending: Family Medicine | Admitting: Family Medicine

## 2017-12-08 ENCOUNTER — Other Ambulatory Visit (HOSPITAL_COMMUNITY)
Admission: RE | Admit: 2017-12-08 | Discharge: 2017-12-08 | Disposition: A | Discharge status: Home / Self Care | Payer: Medicare Other | Source: Ambulatory Visit | Attending: Radiology | Admitting: Radiology

## 2017-12-08 DIAGNOSIS — E041 Nontoxic single thyroid nodule: Secondary | ICD-10-CM

## 2017-12-29 ENCOUNTER — Ambulatory Visit: Payer: Medicare Other | Admitting: Internal Medicine

## 2017-12-29 DIAGNOSIS — Z0289 Encounter for other administrative examinations: Secondary | ICD-10-CM

## 2018-01-20 DIAGNOSIS — I5042 Chronic combined systolic (congestive) and diastolic (congestive) heart failure: Secondary | ICD-10-CM

## 2018-01-20 DIAGNOSIS — I428 Other cardiomyopathies: Secondary | ICD-10-CM

## 2018-01-20 HISTORY — DX: Chronic combined systolic (congestive) and diastolic (congestive) heart failure: I50.42

## 2018-01-20 HISTORY — DX: Other cardiomyopathies: I42.8

## 2018-02-17 ENCOUNTER — Emergency Department (HOSPITAL_COMMUNITY): Payer: Medicare Other

## 2018-02-17 ENCOUNTER — Other Ambulatory Visit: Payer: Self-pay

## 2018-02-17 ENCOUNTER — Encounter (HOSPITAL_COMMUNITY): Payer: Self-pay

## 2018-02-17 ENCOUNTER — Inpatient Hospital Stay (HOSPITAL_COMMUNITY)
Admission: EM | Admit: 2018-02-17 | Discharge: 2018-02-25 | DRG: 286 | Disposition: A | Discharge status: Home / Self Care | Payer: Medicare Other | Source: Ambulatory Visit | Attending: Cardiology | Admitting: Cardiology

## 2018-02-17 DIAGNOSIS — I10 Essential (primary) hypertension: Secondary | ICD-10-CM

## 2018-02-17 DIAGNOSIS — Z6828 Body mass index (BMI) 28.0-28.9, adult: Secondary | ICD-10-CM

## 2018-02-17 DIAGNOSIS — E785 Hyperlipidemia, unspecified: Secondary | ICD-10-CM | POA: Diagnosis present

## 2018-02-17 DIAGNOSIS — G40909 Epilepsy, unspecified, not intractable, without status epilepticus: Secondary | ICD-10-CM | POA: Diagnosis present

## 2018-02-17 DIAGNOSIS — I4892 Unspecified atrial flutter: Secondary | ICD-10-CM | POA: Diagnosis present

## 2018-02-17 DIAGNOSIS — R9431 Abnormal electrocardiogram [ECG] [EKG]: Secondary | ICD-10-CM | POA: Diagnosis present

## 2018-02-17 DIAGNOSIS — E663 Overweight: Secondary | ICD-10-CM | POA: Diagnosis present

## 2018-02-17 DIAGNOSIS — I484 Atypical atrial flutter: Secondary | ICD-10-CM | POA: Diagnosis not present

## 2018-02-17 DIAGNOSIS — I2510 Atherosclerotic heart disease of native coronary artery without angina pectoris: Secondary | ICD-10-CM | POA: Diagnosis present

## 2018-02-17 DIAGNOSIS — K219 Gastro-esophageal reflux disease without esophagitis: Secondary | ICD-10-CM | POA: Diagnosis present

## 2018-02-17 DIAGNOSIS — I110 Hypertensive heart disease with heart failure: Secondary | ICD-10-CM | POA: Diagnosis present

## 2018-02-17 DIAGNOSIS — Z7982 Long term (current) use of aspirin: Secondary | ICD-10-CM

## 2018-02-17 DIAGNOSIS — I5041 Acute combined systolic (congestive) and diastolic (congestive) heart failure: Secondary | ICD-10-CM | POA: Diagnosis present

## 2018-02-17 DIAGNOSIS — I4891 Unspecified atrial fibrillation: Secondary | ICD-10-CM | POA: Diagnosis not present

## 2018-02-17 DIAGNOSIS — E876 Hypokalemia: Secondary | ICD-10-CM | POA: Diagnosis present

## 2018-02-17 DIAGNOSIS — I480 Paroxysmal atrial fibrillation: Secondary | ICD-10-CM

## 2018-02-17 DIAGNOSIS — I5031 Acute diastolic (congestive) heart failure: Secondary | ICD-10-CM | POA: Diagnosis not present

## 2018-02-17 DIAGNOSIS — I272 Pulmonary hypertension, unspecified: Secondary | ICD-10-CM | POA: Diagnosis present

## 2018-02-17 LAB — TSH: TSH: 3.211 u[IU]/mL (ref 0.350–4.500)

## 2018-02-17 LAB — PROTIME-INR
INR: 1.02
Prothrombin Time: 13.4 seconds (ref 11.4–15.2)

## 2018-02-17 LAB — BASIC METABOLIC PANEL
Anion gap: 13 (ref 5–15)
BUN: 16 mg/dL (ref 8–23)
CO2: 24 mmol/L (ref 22–32)
Calcium: 9.4 mg/dL (ref 8.9–10.3)
Chloride: 98 mmol/L (ref 98–111)
Creatinine, Ser: 0.69 mg/dL (ref 0.44–1.00)
GFR calc Af Amer: >60 mL/min (ref >60)
GFR calc non Af Amer: >60 mL/min (ref >60)
Glucose, Bld: 111 mg/dL — ABNORMAL HIGH (ref 70–99)
Potassium: 4.2 mmol/L (ref 3.5–5.1)
Sodium: 135 mmol/L (ref 135–145)

## 2018-02-17 LAB — CBC
HCT: 41.1 % (ref 36.0–46.0)
Hemoglobin: 13.1 g/dL (ref 12.0–15.0)
MCH: 29 pg (ref 26.0–34.0)
MCHC: 31.9 g/dL (ref 30.0–36.0)
MCV: 90.9 fL (ref 80.0–100.0)
Platelets: 335 10*3/uL (ref 150–400)
RBC: 4.52 MIL/uL (ref 3.87–5.11)
RDW: 13.5 % (ref 11.5–15.5)
WBC: 7.5 10*3/uL (ref 4.0–10.5)
nRBC: 0 % (ref 0.0–0.2)

## 2018-02-17 LAB — I-STAT TROPONIN, ED: Troponin i, poc: 0.02 ng/mL (ref 0.00–0.08)

## 2018-02-17 LAB — TROPONIN I: Troponin I: <0.03 ng/mL (ref <0.03)

## 2018-02-17 LAB — APTT: aPTT: 31 seconds (ref 24–36)

## 2018-02-17 LAB — BRAIN NATRIURETIC PEPTIDE: B Natriuretic Peptide: 232.7 pg/mL — ABNORMAL HIGH (ref 0.0–100.0)

## 2018-02-17 MED ORDER — DILTIAZEM HCL-DEXTROSE 100-5 MG/100ML-% IV SOLN (PREMIX)
5.0000 mg/h | INTRAVENOUS | Status: AC
  Administered 2018-02-17: 15 mg/h via INTRAVENOUS
  Administered 2018-02-17: 5 mg/h via INTRAVENOUS
  Administered 2018-02-18 (×2): 15 mg/h via INTRAVENOUS
  Filled 2018-02-17 (×4): qty 100

## 2018-02-17 MED ORDER — FUROSEMIDE 10 MG/ML IJ SOLN
40.0000 mg | Freq: Every day | INTRAMUSCULAR | Status: AC
  Administered 2018-02-17: 40 mg via INTRAVENOUS
  Filled 2018-02-17: qty 4

## 2018-02-17 MED ORDER — APIXABAN 5 MG PO TABS
5.0000 mg | ORAL_TABLET | Freq: Two times a day (BID) | ORAL | Status: DC
  Filled 2018-02-17: qty 1

## 2018-02-17 MED ORDER — CARBAMAZEPINE ER 200 MG PO TB12
200.0000 mg | ORAL_TABLET | Freq: Two times a day (BID) | ORAL | Status: DC
  Administered 2018-02-17 – 2018-02-18 (×2): 200 mg via ORAL
  Filled 2018-02-17 (×2): qty 1

## 2018-02-17 MED ORDER — METOPROLOL TARTRATE 5 MG/5ML IV SOLN
5.0000 mg | Freq: Once | INTRAVENOUS | Status: AC
  Administered 2018-02-17: 5 mg via INTRAVENOUS
  Filled 2018-02-17: qty 5

## 2018-02-17 MED ORDER — FUROSEMIDE 10 MG/ML IJ SOLN: 40.0000 mg | Freq: Every day | INTRAMUSCULAR | Status: DC

## 2018-02-17 MED ORDER — ACETAMINOPHEN 325 MG PO TABS: 650.0000 mg | ORAL_TABLET | ORAL | Status: DC | PRN

## 2018-02-17 MED ORDER — SODIUM CHLORIDE 0.9 % IV BOLUS
500.0000 mL | Freq: Once | INTRAVENOUS | Status: AC
  Administered 2018-02-17: 500 mL via INTRAVENOUS

## 2018-02-17 MED ORDER — ONDANSETRON HCL 4 MG/2ML IJ SOLN: 4.0000 mg | Freq: Four times a day (QID) | INTRAMUSCULAR | Status: DC | PRN

## 2018-02-17 MED ORDER — METOPROLOL TARTRATE 25 MG PO TABS
25.0000 mg | ORAL_TABLET | Freq: Two times a day (BID) | ORAL | Status: DC
  Administered 2018-02-17: 25 mg via ORAL
  Filled 2018-02-17 (×2): qty 1

## 2018-02-17 MED ORDER — HEPARIN (PORCINE) 25000 UT/250ML-% IV SOLN
900.0000 [IU]/h | INTRAVENOUS | Status: AC
  Administered 2018-02-17: 900 [IU]/h via INTRAVENOUS
  Administered 2018-02-18: 700 [IU]/h via INTRAVENOUS
  Filled 2018-02-17 (×2): qty 250

## 2018-02-17 MED ORDER — PRAVASTATIN SODIUM 40 MG PO TABS
40.0000 mg | ORAL_TABLET | Freq: Every day | ORAL | Status: DC
  Administered 2018-02-17 – 2018-02-25 (×9): 40 mg via ORAL
  Filled 2018-02-17 (×9): qty 1

## 2018-02-17 MED ORDER — HEPARIN BOLUS VIA INFUSION
3500.0000 [IU] | Freq: Once | INTRAVENOUS | Status: AC
  Administered 2018-02-17: 3500 [IU] via INTRAVENOUS
  Filled 2018-02-17: qty 3500

## 2018-02-17 MED ORDER — DILTIAZEM HCL 25 MG/5ML IV SOLN
10.0000 mg | Freq: Once | INTRAVENOUS | Status: AC
  Administered 2018-02-17: 10 mg via INTRAVENOUS
  Filled 2018-02-17: qty 5

## 2018-02-17 MED ORDER — FUROSEMIDE 40 MG PO TABS
40.0000 mg | ORAL_TABLET | Freq: Every day | ORAL | Status: DC
  Administered 2018-02-18: 40 mg via ORAL
  Filled 2018-02-17: qty 2

## 2018-02-17 MED ORDER — ALBUTEROL SULFATE (2.5 MG/3ML) 0.083% IN NEBU: 3.0000 mL | INHALATION_SOLUTION | RESPIRATORY_TRACT | Status: DC | PRN

## 2018-02-17 NOTE — ED Triage Notes (Signed)
Per Pt was sent from doctors office for new onset a fib. Pt started feeling bad for about 2 days ago. "felt bad all over". Pt stated that she felt dizzy today when she got out of the car. Pt states "I feel dehydrated". Pt also complaining of shortness of breath. Pt in a fib on monitor.

## 2018-02-17 NOTE — H&P (Addendum)
Cardiology History and Physical:   Patient ID: Maureen [name]; [id number]; 28-Jan-1940   Admit date: 02/17/2018 Date of Consult: 02/17/2018  Primary Care Provider: Myrtis Ser, CNM Primary Cardiologist: New to [facility]    Patient Profile:   Maureen [name] is a 78 y.o. female with a hx of HTN, GERD, benign meninges tumor and history of seizures who is being seen today for the evaluation of new onset atrial fibrillation with RVR at the request of [name], [place].  History of Present Illness:   Maureen [name] is a 78 year old female with a history stated above who presented from her PCP office with complaints of new onset atrial fibrillation.  Patient reports she initially began having lower extremity swelling and shortness of breath last week.  Over the last 2 days her shortness of breath has worsened and she began to notice chest heaviness with difficulty with deep inspiration. She states that she had LE edema last week as well as symptoms consistent with orthopnea. She denies recent illness to include fever, chills, N/V, chest heaviness radiation, dizziness, palpitations, or syncope. She has never experienced this before and has no prior hx of CAD. She denies recent long distance travel and was in her usual state of health up until several days age. Given her persistent symptom, she was seen by her PCP in which an EKG was performed and showed what appears to be sinus tachycardia with a rate of 117. She was then transported to the ED for further evaluation. She denies personal or family hx of AF. She denies tobacco, alcohol or illicit drug use. She reports being under more then normal stress at this time.   In the ED, EKG performed which appears to be consistent with atrial fibrillation with RVR with a heart rate at 127 although difficult to determine secondary to artifact.  Initial i-STAT troponin drawn at 0.00 likely in the setting of rapid rate.  All other labs within normal limits.  TSH normal  at 3.211.  CXR performed with probable small layering right pleural effusion with mild bibasilar sub-segmental atelectasis with no other active cardiopulmonary disease.  Patient was started on IV diltiazem with initiation of a bolus and then 10 mg/h with no significant improvement in HR.  She is not currently on home AV blocking agents.  Given the above, cardiology been asked to evaluate.  Past Medical History:  Diagnosis Date  . Benign tumor of meninges ([place])   . GERD (gastroesophageal reflux disease)    occ. in past  . Headache(784.0)   . HTN (hypertension), benign    pt. states no hypertension  . Pneumonia    ? as child  . PONV (postoperative nausea and vomiting)   . Seizures ([place])    positive for HBP    Past Surgical History:  Procedure Laterality Date  . CATARACT EXTRACTION, BILATERAL Bilateral   . CHOLECYSTECTOMY    . COLONOSCOPY WITH PROPOFOL N/A 11/30/2012   Procedure: COLONOSCOPY WITH PROPOFOL;  Surgeon: Garlan Fair, MD;  Location: [facility];  Service: Endoscopy;  Laterality: N/A;  . ESOPHAGOGASTRODUODENOSCOPY (EGD) WITH PROPOFOL N/A 11/30/2012   Procedure: ESOPHAGOGASTRODUODENOSCOPY (EGD) WITH PROPOFOL;  Surgeon: Garlan Fair, MD;  Location: [facility];  Service: Endoscopy;  Laterality: N/A;  . ESOPHAGOGASTRODUODENOSCOPY (EGD) WITH PROPOFOL N/A 03/14/2015   Procedure: ESOPHAGOGASTRODUODENOSCOPY (EGD) WITH PROPOFOL;  Surgeon: Arta Silence, MD;  Location: [facility];  Service: Endoscopy;  Laterality: N/A;  . EUS N/A 01/05/2013   Procedure: FULL UPPER ENDOSCOPIC  ULTRASOUND (EUS) RADIAL;  Surgeon: Arta Silence, MD;  Location: [facility];  Service: Endoscopy;  Laterality: N/A;  EUS with possible FNA  . EUS N/A 03/14/2015   Procedure: UPPER ENDOSCOPIC ULTRASOUND (EUS) RADIAL;  Surgeon: Arta Silence, MD;  Location: [facility];  Service: Endoscopy;  Laterality: N/A;  . EYE SURGERY Left    "few weeks ago scar tissue laser surgery"  . FINE NEEDLE ASPIRATION  N/A 01/05/2013   Procedure: FINE NEEDLE ASPIRATION (FNA) RADIAL;  Surgeon: Arta Silence, MD;  Location: [facility];  Service: Endoscopy;  Laterality: N/A;  . KNEE ARTHROSCOPY Left   . [place]  12/1990  . VAGINAL HYSTERECTOMY  1983     Prior to Admission medications   Medication Sig Start Date End Date Taking? Authorizing Provider  aspirin EC 81 MG tablet Take 81 mg by mouth daily.   Yes [provider]  calcium-vitamin D (OSCAL WITH D) 500-200 MG-UNIT per tablet Take 1 tablet by mouth 2 (two) times daily.    Yes [provider]  cholecalciferol (VITAMIN D) 1000 UNITS tablet Take 1,000 Units by mouth daily.   Yes [provider]  Multiple Vitamin (MULTIVITAMIN WITH MINERALS) TABS tablet Take 1 tablet by mouth daily.   Yes [provider]  pravastatin (PRAVACHOL) 40 MG tablet Take 40 mg by mouth daily.  05/05/13  Yes [provider]  PROAIR HFA 108 (90 Base) MCG/ACT inhaler Inhale 2 puffs into the lungs every 4 (four) hours as needed. 11/24/17  Yes [provider]  TEGRETOL-XR 200 MG 12 hr tablet Take 1 tablet (200 mg total) by mouth 2 (two) times daily. 08/31/17  Yes Ward Givens, NP    Inpatient Medications: Scheduled Meds: . apixaban  5 mg Oral BID  . furosemide  40 mg Intravenous Daily  . metoprolol tartrate  5 mg Intravenous Once  . metoprolol tartrate  25 mg Oral BID   Continuous Infusions: . diltiazem (CARDIZEM) infusion 15 mg/hr (02/17/18 1552)   PRN Meds:   Allergies:    Allergies  Allergen Reactions  . Dilantin [Phenytoin Sodium Extended]      Stupor, ataxia.   Marland Kitchen Hydrocodone Nausea And Vomiting    Social History:   Social History   Socioeconomic History  . Marital status: Married    Spouse name: Jan  . Number of children: 2  . Years of education: Master's  . Highest education level: Not on file  Occupational History  . Occupation: retired    Comment: [employer] in [place]: [place]  . Financial resource strain: Not on file  . Food insecurity:    Worry: Not on file    Inability: Not on file  . Transportation needs:    Medical: Not on file    Non-medical: Not on file  Tobacco Use  . Smoking status: Never Smoker  . Smokeless tobacco: Never Used  Substance and Sexual Activity  . Alcohol use: Yes    Alcohol/week: 0.0 standard drinks    Comment: once monthly  . Drug use: No  . Sexual activity: Not on file  Lifestyle  . Physical activity:    Days per week: Not on file    Minutes per session: Not on file  . Stress: Not on file  Relationships  . Social connections:    Talks on phone: Not on file    Gets together: Not on file    Attends religious service: Not on file  Active member of club or organization: Not on file    Attends meetings of clubs or organizations: Not on file    Relationship status: Not on file  . Intimate partner violence:    Fear of current or ex partner: Not on file    Emotionally abused: Not on file    Physically abused: Not on file    Forced sexual activity: Not on file  Other Topics Concern  . Not on file  Social History Narrative   She retired in 1995 from [employer]. Lives at home with her husband, Jan. They have a son age 76 and a  son age 26. Cafffeine once daily. a week and alcohol once a month. Denies tobacco and illicit drug use.    Patient is left handed.   Patient has a Master's degree.    Family History:   Family History  Problem Relation Age of Onset  . Dementia Father        lewy body dementia  . Heart disease Other        PACEMAKER DEFIB.     Family Status:  Family Status  Relation Name Status  . Mother  Deceased at age 59       pancreatic cancer  . Father  Deceased at age 65       alzheimer's  . Other HUSBAND (Not Specified)   ROS:  Please see the history of present illness.  All other ROS reviewed and negative.     Physical Exam/Data:   Vitals:    02/17/18 1415 02/17/18 1430 02/17/18 1515 02/17/18 1545  BP: (!) 150/108 (!) 148/105 (!) 153/108 (!) 151/97  Pulse: (!) 125 (!) 124 (!) 124 (!) 123  Resp: 20 (!) 22 (!) 23 (!) 26  SpO2: 96% 93% 96% 98%    Intake/Output Summary (Last 24 hours) at 02/17/2018 1655 Last data filed at 02/17/2018 1434 Gross per 24 hour  Intake 500 ml  Output -  Net 500 ml   There were no vitals filed for this visit. There is no height or weight on file to calculate BMI.   General: Overweight,  NAD Skin: Warm, dry, intact  Head: Normocephalic, atraumatic, clear, moist mucus membranes. Neck: Negative for carotid bruits. No JVD Lungs:Clear to ausculation bilaterally. No wheezes, rales, or rhonchi. Breathing is unlabored. Cardiovascular: Irregularly irregular with S1 S2. No murmurs, rubs, gallops, or LV heave appreciated. Abdomen: Soft, non-tender, non-distended with normoactive bowel sounds. No obvious abdominal masses. MSK: Strength and tone appear normal for age. 5/5 in all extremities Extremities: Mild 1+ LE edema. No clubbing or cyanosis. DP/PT pulses 2+ bilaterally Neuro: Alert and oriented. No focal deficits. No facial asymmetry. MAE spontaneously. Psych: Responds to questions appropriately with normal affect.    EKG:  The EKG was personally reviewed and demonstrates: 02/17/2018 atrial fibrillation with RVR with heart rate 127 Telemetry:  Telemetry was personally reviewed and demonstrates: 02/17/2018 atrial fibrillation  Relevant CV Studies:  ECHO: None  CATH: None  Laboratory Data:  Chemistry Recent Labs  Lab 02/17/18 1345  NA 135  K 4.2  CL 98  CO2 24  GLUCOSE 111*  BUN 16  CREATININE 0.69  CALCIUM 9.4  GFRNONAA >60  GFRAA >60  ANIONGAP 13    Total Protein  Date Value Ref Range Status  08/31/2017 6.4 6.0 - 8.5 g/dL Final   Albumin  Date Value Ref Range Status  08/31/2017 4.5 3.5 - 4.8 g/dL Final   AST  Date Value Ref Range  Status  08/31/2017 19 0 - 40 IU/L Final   ALT    Date Value Ref Range Status  08/31/2017 19 0 - 32 IU/L Final   Alkaline Phosphatase  Date Value Ref Range Status  08/31/2017 88 39 - 117 IU/L Final   Bilirubin Total  Date Value Ref Range Status  08/31/2017 <0.2 0.0 - 1.2 mg/dL Final   Hematology Recent Labs  Lab 02/17/18 1345  WBC 7.5  RBC 4.52  HGB 13.1  HCT 41.1  MCV 90.9  MCH 29.0  MCHC 31.9  RDW 13.5  PLT 335   Cardiac EnzymesNo results for input(s): TROPONINI in the last 168 hours.  Recent Labs  Lab 02/17/18 1353  TROPIPOC 0.02    BNPNo results for input(s): BNP, PROBNP in the last 168 hours.  DDimer No results for input(s): DDIMER in the last 168 hours. TSH:  Lab Results  Component Value Date   TSH 3.211 02/17/2018   Lipids:No results found for: CHOL, HDL, LDLCALC, LDLDIRECT, TRIG, CHOLHDL HgbA1c:No results found for: HGBA1C  Radiology/Studies:  Dg Chest 2 View  Result Date: 02/17/2018 CLINICAL DATA:  Initial evaluation for acute shortness of breath with cough. EXAM: CHEST - 2 VIEW COMPARISON:  Prior radiograph from 11/13/2017 FINDINGS: Transverse heart size at the upper limits of normal. Mediastinal silhouette within normal limits. Lungs normally inflated. Mild scattered bibasilar subsegmental atelectasis. No consolidative opacity. No pulmonary edema. Probable small right pleural effusion seen layering posteriorly on lateral projection. No pneumothorax. No acute osseous finding. IMPRESSION: 1. Probable small layering right pleural effusion. 2. Mild bibasilar subsegmental atelectasis. No other active cardiopulmonary disease. Electronically Signed   By: Jeannine Boga M.D.   On: 02/17/2018 15:16   Assessment and Plan:   1.  New onset atrial fibrillation with RVR: -Patient presented initially to her PCP office with complaints of dyspnea on exertion and at rest with associated, LE swelling and fatigue.  -EKG performed appears to be ST with a rate in the 1120 range and was therefore sent to the ED for  further evaluation  -Repeat EKG [facility] at [facility] appears to be consistent with AF with RVR  -Currently on Diltiazem gtt at 15mg /hr with no significant improvement in rate at this time -TSH within normal range -Troponin, 0.02 with no ACS symptoms>>continue to trend. Mild elevation likely in the setting of demand ischemia and not ACS. Will trend trop and if significant elevation can consider ischemic evaluation with stress is necessary  -Give 5 mg IV Lopressor now and if rate not less than 100bpm can give 10mg  IV diltiazem bolus and continue gtt -Start lopressor 12.5 mg BID beginning tonight at 2200 -Initiate Eliquis 5 mg twice daily given elevated CHA2DS2VASc score -Obtain echocardiogram to assess LV and valve function prior to discharge -Will anticoagulate with Eliquis 5 mg twice daily (no hx of hemorraghic CVA or GI bleed). If no rate improvement will plan on DCCV after adequate AC. If her rate improves and she remains in AF, will have her seen in the office in 3-4 weeks to assess for OP DCCV -Daily weight, strict I&O -CHA2DS2VASc =4 (hypertension, age, sex)  2.  Hypertension: -Elevated, 151/97, 153/108, 148/105 -Not currently on home antihypertensives -Will initiate low-dose beta-blocker in the setting of atrial fibrillation as well as hypertension -Follow closely    For questions or updates, please contact [place] Please consult [email or web address] for contact info under Cardiology/STEMI.   SignedKathyrn Drown NP-C [facility] Pager: [phone] 02/17/2018 4:55 PM

## 2018-02-17 NOTE — Plan of Care (Signed)
  Problem: Clinical Measurements: Goal: Will remain free from infection Outcome: Progressing Goal: Respiratory complications will improve Outcome: Progressing   Problem: Activity: Goal: Risk for activity intolerance will decrease Outcome: Progressing   Problem: Elimination: Goal: Will not experience complications related to bowel motility Outcome: Progressing   Problem: Pain Managment: Goal: General experience of comfort will improve Outcome: Progressing   Problem: Safety: Goal: Ability to remain free from injury will improve Outcome: Progressing

## 2018-02-17 NOTE — Progress Notes (Signed)
ANTICOAGULATION CONSULT NOTE - Initial Consult  Pharmacy Consult for heparin Indication: atrial fibrillation   Patient Measurements: Heparin Dosing Weight: 63 kg   Vital Signs: BP: 151/97 (01/29 1545) Pulse Rate: 123 (01/29 1545)  Labs: Recent Labs    02/17/18 1345  HGB 13.1  HCT 41.1  PLT 335  CREATININE 0.69    Medical History: Past Medical History:  Diagnosis Date  . Benign tumor of meninges ([place])   . GERD (gastroesophageal reflux disease)    occ. in past  . Headache(784.0)   . HTN (hypertension), benign    pt. states no hypertension  . Pneumonia    ? as child  . PONV (postoperative nausea and vomiting)   . Seizures ([facility])    positive for HBP    Assessment: 78 year old female sent from her PCP. She complained of lower extremity swelling and SOB and was diagnosed with new onset AFib. She will be placed on a heparin infusion until plans for oral anticoagulation have been decided. SCr 0.6, h/h plts wnl.   Goal of Therapy:  Heparin level 0.3-0.7 units/ml Monitor platelets by anticoagulation protocol: Yes    Plan:  -Heparin bolus 3500 units 1 then 900 units/hr -Daily HL, CBC -First level with AM labs   Maureen [name], Maureen [name] 02/17/2018,5:15 PM

## 2018-02-17 NOTE — ED Notes (Signed)
Patient transported to X-ray 

## 2018-02-17 NOTE — ED Provider Notes (Signed)
[facility] EMERGENCY DEPARTMENT Provider Note   CSN: [id number] Arrival date & time: 02/17/18  1318     History   Chief Complaint Chief Complaint  Patient presents with  . Atrial Fibrillation    HPI Maureen [name] is a 78 y.o. female.  Patient presents the emergency department today from PCP office with complaint of new onset of atrial fibrillation.  Patient has felt short of breath and has had generalized fatigue ongoing over the past 2 days.  She had difficulty sleeping at night because of her symptoms.  She feels dizzy at times when she stands up.  She has not had any chest pain, fevers, cough.  No recent vomiting or diarrhea.  No syncopal episodes or urinary symptoms.  Patient does state recent thyroid biopsy stating that she was running a little hyperthyroid.  She is not think she is on any medicines for this.  No treatments prior to arrival.  Patient is not anticoagulated. The onset of this condition was acute. The course is constant. Aggravating factors: none. Alleviating factors: none.       Past Medical History:  Diagnosis Date  . Benign tumor of meninges ([place])   . GERD (gastroesophageal reflux disease)    occ. in past  . Headache(784.0)   . HTN (hypertension), benign    pt. states no hypertension  . Pneumonia    ? as child  . PONV (postoperative nausea and vomiting)   . Seizures ([place])    positive for HBP    Patient Active Problem List   Diagnosis Date Noted  . Partial symptomatic epilepsy with complex partial seizures, not intractable, without status epilepticus ([place]) 11/10/2016  . Incongruous diplopia 11/10/2016  . Benign neoplasm of brain ([place]) 11/10/2016  . Seizure disorder, focal motor ([place]) 05/26/2013  . Medication monitoring encounter 05/26/2013  . Meningioma ([place]) 05/26/2012  . Seizures ([place]) 05/26/2012  . Focal (motor) epilepsy ([place]) 05/26/2012  . HTN (hypertension), benign     Past Surgical History:  Procedure Laterality Date   . CATARACT EXTRACTION, BILATERAL Bilateral   . CHOLECYSTECTOMY    . COLONOSCOPY WITH PROPOFOL N/A 11/30/2012   Procedure: COLONOSCOPY WITH PROPOFOL;  Surgeon: Garlan Fair, MD;  Location: [facility];  Service: Endoscopy;  Laterality: N/A;  . ESOPHAGOGASTRODUODENOSCOPY (EGD) WITH PROPOFOL N/A 11/30/2012   Procedure: ESOPHAGOGASTRODUODENOSCOPY (EGD) WITH PROPOFOL;  Surgeon: Garlan Fair, MD;  Location: [facility];  Service: Endoscopy;  Laterality: N/A;  . ESOPHAGOGASTRODUODENOSCOPY (EGD) WITH PROPOFOL N/A 03/14/2015   Procedure: ESOPHAGOGASTRODUODENOSCOPY (EGD) WITH PROPOFOL;  Surgeon: Arta Silence, MD;  Location: [facility];  Service: Endoscopy;  Laterality: N/A;  . EUS N/A 01/05/2013   Procedure: FULL UPPER ENDOSCOPIC ULTRASOUND (EUS) RADIAL;  Surgeon: Arta Silence, MD;  Location: [facility];  Service: Endoscopy;  Laterality: N/A;  EUS with possible FNA  . EUS N/A 03/14/2015   Procedure: UPPER ENDOSCOPIC ULTRASOUND (EUS) RADIAL;  Surgeon: Arta Silence, MD;  Location: [facility];  Service: Endoscopy;  Laterality: N/A;  . EYE SURGERY Left    "few weeks ago scar tissue laser surgery"  . FINE NEEDLE ASPIRATION N/A 01/05/2013   Procedure: FINE NEEDLE ASPIRATION (FNA) RADIAL;  Surgeon: Arta Silence, MD;  Location: [facility];  Service: Endoscopy;  Laterality: N/A;  . KNEE ARTHROSCOPY Left   . [place]  12/1990  . VAGINAL HYSTERECTOMY  1983     OB History   No obstetric history on file.      Home Medications    Prior  to Admission medications   Medication Sig Start Date End Date Taking? Authorizing Provider  aspirin EC 81 MG tablet Take 81 mg by mouth daily.    [provider]  calcium-vitamin D (OSCAL WITH D) 500-200 MG-UNIT per tablet Take 1 tablet by mouth 2 (two) times daily.     [provider]  cholecalciferol (VITAMIN D) 1000 UNITS tablet Take 1,000 Units by mouth daily.    [provider]  Multiple Vitamin  (MULTIVITAMIN WITH MINERALS) TABS tablet Take 1 tablet by mouth daily.    [provider]  pravastatin (PRAVACHOL) 40 MG tablet Take 20 mg by mouth daily.  05/05/13   [provider]  TEGRETOL-XR 200 MG 12 hr tablet Take 1 tablet (200 mg total) by mouth 2 (two) times daily. 08/31/17   Ward Givens, NP    Family History Family History  Problem Relation Age of Onset  . Dementia Father        lewy body dementia  . Heart disease Other        PACEMAKER DEFIB.      Social History Social History   Tobacco Use  . Smoking status: Never Smoker  . Smokeless tobacco: Never Used  Substance Use Topics  . Alcohol use: Yes    Alcohol/week: 0.0 standard drinks    Comment: once monthly  . Drug use: No     Allergies   Dilantin [phenytoin sodium extended] and Hydrocodone   Review of Systems Review of Systems  Constitutional: Positive for fatigue. Negative for diaphoresis and fever.  Eyes: Negative for redness.  Respiratory: Positive for shortness of breath. Negative for cough.   Cardiovascular: Negative for chest pain, palpitations and leg swelling.  Gastrointestinal: Negative for abdominal pain, nausea and vomiting.  Genitourinary: Negative for dysuria.  Musculoskeletal: Negative for back pain and neck pain.  Skin: Negative for rash.  Neurological: Positive for weakness (Generalized). Negative for syncope and light-headedness.  Psychiatric/Behavioral: The patient is not nervous/anxious.      Physical Exam Updated Vital Signs BP (!) 151/97   Pulse (!) 123   Resp (!) 26   SpO2 98%   Physical Exam Vitals signs and nursing note reviewed.  Constitutional:      Appearance: She is well-developed. She is not diaphoretic.  HENT:     Head: Normocephalic and atraumatic.     Mouth/Throat:     Mouth: Mucous membranes are not dry.  Eyes:     Conjunctiva/sclera: Conjunctivae normal.  Neck:     Musculoskeletal: Normal range of motion and neck supple. No muscular  tenderness.     Vascular: Normal carotid pulses. No carotid bruit or JVD.     Trachea: Trachea normal. No tracheal deviation.  Cardiovascular:     Rate and Rhythm: Tachycardia present. Rhythm irregularly irregular.     Pulses: No decreased pulses.     Heart sounds: Normal heart sounds, S1 normal and S2 normal. No murmur.  Pulmonary:     Effort: Pulmonary effort is normal. No respiratory distress.     Breath sounds: No wheezing.  Chest:     Chest wall: No tenderness.  Abdominal:     General: Bowel sounds are normal.     Palpations: Abdomen is soft.     Tenderness: There is no abdominal tenderness. There is no guarding or rebound.  Musculoskeletal: Normal range of motion.  Skin:    General: Skin is warm and dry.     Coloration: Skin is not pale.  Neurological:  Mental Status: She is alert.      ED Treatments / Results  Labs (all labs ordered are listed, but only abnormal results are displayed) Labs Reviewed  BASIC METABOLIC PANEL - Abnormal; Notable for the following components:      Result Value   Glucose, Bld 111 (*)    All other components within normal limits  CBC  TSH  I-STAT TROPONIN, ED    EKG EKG Interpretation  Date/Time:  Wednesday February 17 2018 13:25:58 EST Ventricular Rate:  126 PR Interval:    QRS Duration: 128 QT Interval:  354 QTC Calculation: 852 R Axis:   -48 Text Interpretation:  Atrial fibrillation with rapid ventricular response Nonspecific IVCD with LAD Left ventricular hypertrophy Anterior infarct, old Since last EKG, AFib is now evident Confirmed by Duffy Bruce [id number]) on 02/17/2018 1:39:18 PM   Radiology No results found.  Procedures Procedures (including critical care time)  Medications Ordered in ED Medications  diltiazem (CARDIZEM) injection 10 mg (has no administration in time range)     Initial Impression / Assessment and Plan / ED Course  I have reviewed the triage vital signs and the nursing notes.  Pertinent labs  & imaging results that were available during my care of the patient were reviewed by me and considered in my medical decision making (see chart for details).     Patient seen and examined.  Patient noted to be in atrial fibrillation with rapid ventricular response at a rate of 115-130.  She does not appear to be in any distress.  We will check labs, EKG, chest x-ray, give fluid bolus and 10 mg Cardizem.  Vital signs reviewed and are as follows: BP (!) 150/106   Pulse (!) 124   Resp (!) 25   SpO2 96%   2:45 PM Spoke with Trish and requested cards consult.  4:18 PM Patient on cardizem drip. Cardiology in with patient. Lawyer PA-C aware of patient at shift change.   CRITICAL CARE Performed by: [name] PA-C Total critical care time: 35 minutes Critical care time was exclusive of separately billable procedures and treating other patients. Critical care was necessary to treat or prevent imminent or life-threatening deterioration. Critical care was time spent personally by me on the following activities: development of treatment plan with patient and/or surrogate as well as nursing, discussions with consultants, evaluation of patient's response to treatment, examination of patient, obtaining history from patient or surrogate, ordering and performing treatments and interventions, ordering and review of laboratory studies, ordering and review of radiographic studies, pulse oximetry and re-evaluation of patient's condition.    Final Clinical Impressions(s) / ED Diagnoses   Final diagnoses:  Atrial fibrillation with RVR [facility])   Pending cardiology consult.   ED Discharge Orders    None       [name], Hershal Coria 02/17/18 1619    Duffy Bruce, MD 02/18/18 [phone]

## 2018-02-17 NOTE — ED Notes (Signed)
Cardiology at bedside.

## 2018-02-18 DIAGNOSIS — I483 Typical atrial flutter: Secondary | ICD-10-CM | POA: Diagnosis not present

## 2018-02-18 DIAGNOSIS — I5031 Acute diastolic (congestive) heart failure: Secondary | ICD-10-CM | POA: Diagnosis not present

## 2018-02-18 DIAGNOSIS — G40909 Epilepsy, unspecified, not intractable, without status epilepticus: Secondary | ICD-10-CM

## 2018-02-18 DIAGNOSIS — I4891 Unspecified atrial fibrillation: Secondary | ICD-10-CM | POA: Diagnosis not present

## 2018-02-18 HISTORY — PX: TRANSTHORACIC ECHOCARDIOGRAM: SHX275

## 2018-02-18 LAB — HEPARIN LEVEL (UNFRACTIONATED)
Heparin Unfractionated: 0.92 IU/mL — ABNORMAL HIGH (ref 0.30–0.70)
Heparin Unfractionated: 0.7 IU/mL (ref 0.30–0.70)

## 2018-02-18 LAB — LIPID PANEL
Cholesterol: 144 mg/dL (ref 0–200)
HDL: 55 mg/dL (ref >40)
LDL Cholesterol: 75 mg/dL (ref 0–99)
Total CHOL/HDL Ratio: 2.6 RATIO
Triglycerides: 71 mg/dL (ref <150)
VLDL: 14 mg/dL (ref 0–40)

## 2018-02-18 LAB — CBC
HCT: 38 % (ref 36.0–46.0)
Hemoglobin: 12.3 g/dL (ref 12.0–15.0)
MCH: 28.9 pg (ref 26.0–34.0)
MCHC: 32.4 g/dL (ref 30.0–36.0)
MCV: 89.2 fL (ref 80.0–100.0)
Platelets: 306 10*3/uL (ref 150–400)
RBC: 4.26 MIL/uL (ref 3.87–5.11)
RDW: 13.7 % (ref 11.5–15.5)
WBC: 8.7 10*3/uL (ref 4.0–10.5)
nRBC: 0 % (ref 0.0–0.2)

## 2018-02-18 LAB — BASIC METABOLIC PANEL
Anion gap: 8 (ref 5–15)
BUN: 15 mg/dL (ref 8–23)
CO2: 28 mmol/L (ref 22–32)
Calcium: 8.7 mg/dL — ABNORMAL LOW (ref 8.9–10.3)
Chloride: 102 mmol/L (ref 98–111)
Creatinine, Ser: 0.84 mg/dL (ref 0.44–1.00)
GFR calc Af Amer: >60 mL/min (ref >60)
GFR calc non Af Amer: >60 mL/min (ref >60)
Glucose, Bld: 119 mg/dL — ABNORMAL HIGH (ref 70–99)
Potassium: 3.8 mmol/L (ref 3.5–5.1)
Sodium: 138 mmol/L (ref 135–145)

## 2018-02-18 LAB — TROPONIN I
Troponin I: <0.03 ng/mL (ref <0.03)
Troponin I: <0.03 ng/mL (ref <0.03)

## 2018-02-18 MED ORDER — POTASSIUM CHLORIDE CRYS ER 20 MEQ PO TBCR
40.0000 meq | EXTENDED_RELEASE_TABLET | Freq: Once | ORAL | Status: AC
  Administered 2018-02-18: 40 meq via ORAL
  Filled 2018-02-18: qty 2

## 2018-02-18 MED ORDER — METOPROLOL TARTRATE 50 MG PO TABS
50.0000 mg | ORAL_TABLET | Freq: Two times a day (BID) | ORAL | Status: DC
  Administered 2018-02-18 (×2): 50 mg via ORAL
  Filled 2018-02-18 (×2): qty 1

## 2018-02-18 MED ORDER — DILTIAZEM HCL ER COATED BEADS 180 MG PO CP24
360.0000 mg | ORAL_CAPSULE | Freq: Every day | ORAL | Status: DC
  Administered 2018-02-18 – 2018-02-22 (×5): 360 mg via ORAL
  Filled 2018-02-18 (×5): qty 2

## 2018-02-18 MED ORDER — CARBAMAZEPINE ER 100 MG PO TB12: 100.0000 mg | ORAL_TABLET | Freq: Every day | ORAL | Status: DC

## 2018-02-18 MED ORDER — CARBAMAZEPINE ER 100 MG PO TB12
100.0000 mg | ORAL_TABLET | Freq: Two times a day (BID) | ORAL | Status: DC
  Administered 2018-02-22 – 2018-02-25 (×7): 100 mg via ORAL
  Filled 2018-02-18 (×7): qty 1

## 2018-02-18 MED ORDER — CARBAMAZEPINE ER 100 MG PO TB12
100.0000 mg | ORAL_TABLET | Freq: Every day | ORAL | Status: AC
  Administered 2018-02-18 – 2018-02-21 (×4): 100 mg via ORAL
  Filled 2018-02-18 (×4): qty 1

## 2018-02-18 MED ORDER — LEVETIRACETAM IN NACL 1000 MG/100ML IV SOLN
1000.0000 mg | Freq: Once | INTRAVENOUS | Status: AC
  Administered 2018-02-18: 1000 mg via INTRAVENOUS
  Filled 2018-02-18: qty 100

## 2018-02-18 MED ORDER — LEVETIRACETAM 500 MG PO TABS
500.0000 mg | ORAL_TABLET | Freq: Two times a day (BID) | ORAL | Status: DC
  Administered 2018-02-18 – 2018-02-25 (×14): 500 mg via ORAL
  Filled 2018-02-18 (×14): qty 1

## 2018-02-18 MED ORDER — CARBAMAZEPINE ER 200 MG PO TB12
200.0000 mg | ORAL_TABLET | Freq: Every day | ORAL | Status: AC
  Administered 2018-02-19 – 2018-02-21 (×3): 200 mg via ORAL
  Filled 2018-02-18 (×3): qty 1

## 2018-02-18 NOTE — Progress Notes (Signed)
ANTICOAGULATION CONSULT NOTE - Follow Up Consult  Pharmacy Consult for heparin Indication: atrial fibrillation  Labs: Recent Labs    02/17/18 1345 02/17/18 1833 02/18/18 0027 02/18/18 0604  HGB 13.1  --   --  12.3  HCT 41.1  --   --  38.0  PLT 335  --   --  306  APTT  --  31  --   --   LABPROT  --  13.4  --   --   INR  --  1.02  --   --   HEPARINUNFRC  --   --  0.92* 0.70  CREATININE 0.69  --   --  0.84  TROPONINI  --  <0.03 <0.03 <0.03    Assessment: 78yo female on heparin for afib.   Level high overnight and is currently still at upper limit of goal after rate adjustment. Will again decrease rate, no bleeding issues noted, hgb down slightly from 13>12.   Goal of Therapy:  Heparin level 0.3-0.7 units/ml   Plan:  Decrease heparin to 700 units/hr Follow up plan for oral anticoagulation  Erin Hearing PharmD., BCPS Clinical Pharmacist 02/18/2018 8:03 AM

## 2018-02-18 NOTE — Consult Note (Signed)
Neurology Consultation Reason for Consult: Medication management Referring Physician: Ellyn Hack, D  CC: New onset atrial fibrillation  History is obtained from: Patient, husband  HPI: Maureen [name] is a 78 y.o. female with a history of meningioma resected by craniotomy in the early 90s who had multiple seizures around the time of the meningioma and then recurrent seizures for about 10 years following this.  Her last definite seizure was in 2002.  Since that time, she has been maintained on Tegretol and currently takes 200 mg of Tegretol XR twice daily.  Unfortunately she has developed new onset atrial fibrillation and neurology has been consulted given the concern about the interaction between Tegretol and the new anticoagulants.  I have discussed her case with pharmacy, and I think that we can expect at least some degree of increased induction from the carbamazepine for up to 2 weeks following cessation of the medication.   ROS: A 14 point ROS was performed and is negative except as noted in the HPI.   Past Medical History:  Diagnosis Date  . Benign tumor of meninges ([place])   . GERD (gastroesophageal reflux disease)    occ. in past  . Headache(784.0)   . HTN (hypertension), benign    pt. states no hypertension  . Pneumonia    ? as child  . PONV (postoperative nausea and vomiting)   . Seizures ([place])    positive for HBP     Family History  Problem Relation Age of Onset  . Dementia Father        lewy body dementia  . Heart disease Other        PACEMAKER DEFIB.       Social History:  reports that she has never smoked. She has never used smokeless tobacco. She reports current alcohol use. She reports that she does not use drugs.   Exam: Current vital signs: BP 115/72 (BP Location: Left Arm)   Pulse 61   Temp 98.3 F (36.8 C) (Oral)   Resp (!) 21   Ht 5\' 1"  (1.549 m)   Wt 69.2 kg   SpO2 97%   BMI 28.83 kg/m  Vital signs in last 24 hours: Temp:  [97.9 F (36.6  C)-98.3 F (36.8 C)] 98.3 F (36.8 C) (01/30 1103) Pulse Rate:  [61-128] 61 (01/30 1103) Resp:  [18-31] 21 (01/30 1103) BP: (110-156)/(64-112) 115/72 (01/30 1103) SpO2:  [93 %-98 %] 97 % (01/30 1103) Weight:  [69.2 kg-70.3 kg] 69.2 kg (01/30 0532)   Physical Exam  Constitutional: Appears well-developed and well-nourished.  Psych: Affect appropriate to situation Eyes: No scleral injection HENT: No OP obstrucion Head: Normocephalic.  Cardiovascular: Normal rate and regular rhythm.  Respiratory: Effort normal, non-labored breathing GI: Soft.  No distension. There is no tenderness.  Skin: WDI  Neuro: Mental Status: Patient is awake, alert, oriented to person, place, month, year, and situation. Patient is able to give a clear and coherent history. No signs of aphasia or neglect Cranial Nerves: II: Visual Fields are full. Pupils are equal, round, and reactive to light.   III,IV, VI: EOMI without ptosis or diploplia.  V: Facial sensation is symmetric to temperature VII: Facial movement is symmetric.  VIII: hearing is intact to voice X: Uvula elevates symmetrically XI: Shoulder shrug is symmetric. XII: tongue is midline without atrophy or fasciculations.  Motor: Tone is normal. Bulk is normal. 5/5 strength was present in all four extremities.  Sensory: Sensation is symmetric to light touch and temperature in the arms  and legs. Cerebellar: FNF intact bilaterally   I have reviewed labs in epic and the results pertinent to this consultation are: Most recent carbamazepine level from 08/31/2017 was 5.3  I have reviewed the images obtained: MRI brain from November 2018-small area of  encephalomalacia as well as small meningiomas  Impression: 78 year old female with a history of seizures for approximately 10 years following meningioma resection, with most recent seizure in 2002.  Given the duration of time following her craniotomy that she continued to have seizures, I suspect that  she is still at risk of seizures, but simply treated appropriately with carbamazepine, and therefore I would not favor discontinuing antiepileptic therapy completely.  I had a long discussion with her about options including staying on Tegretol and starting Coumadin.  I think this would be a reasonable option, however the patient has concerns about the frequent blood checks associated with Coumadin.  I discussed with her that with any seizure medication change she is at increased risk of breakthrough seizures.  She has already been started on Keppra, and I think that this is a reasonable option, but given the increased risk of generalized seizures associated with rapid wean of Tegretol I would favor weaning off over 12 days.  Recommendations: 1) she has been started on Keppra and I would continue this at 500 twice daily as she weans Tegretol 2) I would reduce Tegretol according to the following schedule: Tegretol XR  Days 1-4: 200mg  qam and 100mg  qpm Days 5-8: 100mg  BID Days 9-12: 100mg  qam Then stop 3) she will need alternative anticoagulation other than novel anticoagulants until 2 weeks after stopping Tegretol therapy.  Roland Rack, MD [facility] [phone]  If 7pm- 7am, please page neurology on call as listed in [place].

## 2018-02-18 NOTE — Progress Notes (Addendum)
Progress Note  Patient Name: Maureen [name] Date of Encounter: 02/18/2018  Primary Cardiologist: New-Dr. Ellyn Hack, MD  Subjective   Pt feeling well today. Denies chest pain or SOB.  Diuresed well overnight.  Continue heparin gtt for now.  Neurology to see.  Inpatient Medications    Scheduled Meds: . carbamazepine  200 mg Oral BID  . furosemide  40 mg Oral Daily  . metoprolol tartrate  25 mg Oral BID  . potassium chloride  40 mEq Oral Once  . pravastatin  40 mg Oral Daily   Continuous Infusions: . diltiazem (CARDIZEM) infusion 15 mg/hr (02/18/18 0500)  . heparin 700 Units/hr (02/18/18 0843)   PRN Meds: acetaminophen, albuterol, ondansetron (ZOFRAN) IV   Vital Signs    Vitals:   02/18/18 0000 02/18/18 0530 02/18/18 0532 02/18/18 0800  BP: 110/89 113/64  (!) 124/97  Pulse: (!) 119 93  79  Resp: 18   (!) 21  Temp: 98.2 F (36.8 C) 97.9 F (36.6 C)  98 F (36.7 C)  TempSrc: Oral Oral  Oral  SpO2: 96% 94%  94%  Weight:   69.2 kg   Height:        Intake/Output Summary (Last 24 hours) at 02/18/2018 0850 Last data filed at 02/18/2018 0300 Gross per 24 hour  Intake 1244.32 ml  Output 1750 ml  Net -505.68 ml   Filed Weights   02/17/18 1810 02/18/18 0532  Weight: 70.3 kg 69.2 kg   Physical Exam   General: Well developed, well nourished, NAD Skin: Warm, dry, intact  Head: Normocephalic, atraumatic, clear, moist mucus membranes. Neck: Negative for carotid bruits. No JVD Lungs:Clear to ausculation bilaterally. No wheezes, rales, or rhonchi. Breathing is unlabored. Cardiovascular: Irregularly irregular with S1 S2. No murmurs, rubs, gallops, or LV heave appreciated. Abdomen: Soft, non-tender, non-distended with normoactive bowel sounds. No obvious abdominal masses. MSK: Strength and tone appear normal for age. 5/5 in all extremities Extremities: No edema. No clubbing or cyanosis. DP/PT pulses 2+ bilaterally Neuro: Alert and oriented. No focal deficits. No facial  asymmetry. MAE spontaneously. Psych: Responds to questions appropriately with normal affect.    Labs    Chemistry Recent Labs  Lab 02/17/18 1345 02/18/18 0604  NA 135 138  K 4.2 3.8  CL 98 102  CO2 24 28  GLUCOSE 111* 119*  BUN 16 15  CREATININE 0.69 0.84  CALCIUM 9.4 8.7*  GFRNONAA >60 >60  GFRAA >60 >60  ANIONGAP 13 8     Hematology Recent Labs  Lab 02/17/18 1345 02/18/18 0604  WBC 7.5 8.7  RBC 4.52 4.26  HGB 13.1 12.3  HCT 41.1 38.0  MCV 90.9 89.2  MCH 29.0 28.9  MCHC 31.9 32.4  RDW 13.5 13.7  PLT 335 306    Cardiac Enzymes Recent Labs  Lab 02/17/18 1833 02/18/18 0027 02/18/18 0604  TROPONINI <0.03 <0.03 <0.03    Recent Labs  Lab 02/17/18 1353  TROPIPOC 0.02     BNP Recent Labs  Lab 02/17/18 1833  BNP 232.7*     DDimer No results for input(s): DDIMER in the last 168 hours.   Radiology    Dg Chest 2 View  Result Date: 02/17/2018 CLINICAL DATA:  Initial evaluation for acute shortness of breath with cough. EXAM: CHEST - 2 VIEW COMPARISON:  Prior radiograph from 11/13/2017 FINDINGS: Transverse heart size at the upper limits of normal. Mediastinal silhouette within normal limits. Lungs normally inflated. Mild scattered bibasilar subsegmental atelectasis. No consolidative opacity. No  pulmonary edema. Probable small right pleural effusion seen layering posteriorly on lateral projection. No pneumothorax. No acute osseous finding. IMPRESSION: 1. Probable small layering right pleural effusion. 2. Mild bibasilar subsegmental atelectasis. No other active cardiopulmonary disease. Electronically Signed   By: Jeannine Boga M.D.   On: 02/17/2018 15:16   Telemetry    02/18/2018 atrial fibrillation, HR 120s- Personally Reviewed  ECG    No new tracings as of 02/18/2018- Personally Reviewed  Cardiac Studies   None   Patient Profile     78 y.o. female with a hx of HTN, GERD, benign meninges tumor and history of seizures who is being seen today for  the evaluation of new onset atrial fibrillation with RVR at the request of Maureen Cater, PA.  Assessment & Plan    1.  New onset atrial fibrillation with RVR: -Patient presented initially to her PCP office with complaints of dyspnea on exertion and at rest with associated, LE swelling and fatigue.  -EKG with 2:1 A flutter and was therefore sent to the ED for further evaluation  -Repeat E[facility] at [facility] appears to be consistent with AF with RVR  -Currently on Diltiazem gtt at 15mg /hr with no significant improvement in rate at this time -TSH within normal range -Troponin, <0.03, <0.03, <0.03 with no ACS symptoms -No Eliquis in the setting of Tegretol therapy for seizures>>>currently on Hep gtt for [facility] until we can speak to neurology team about potential medication adjustments  -Obtain echocardiogram to assess LV and valve function prior to discharge -Will anticoagulate with Hep gtt for now. May need Coumadin initiation   -Neurology team curb-sided for medication assistance who suggests transitioning the patient to [place].  Will initiate IV Keppra load of 1000 mg now and start 500 mg twice daily this evening at 2200.  Formal consultation with neurology team later today.  Appreciate their assistance.  -I&O, net -500 mL since hospital admission -Weight, 152lb today  -Does not appear to be fluid volume overloaded on exam -CHA2DS2VASc =4 (hypertension, age, sex)  2.  Hypertension: -Elevated, 124/97>113/64>127/82 -Not currently on home antihypertensives -Will initiate beta-blocker in the setting of atrial fibrillation as well as hypertension -Will increase metoprolol to 50 mg twice daily given sustained HR   Signed, Kathyrn Drown NP-C [facility] Pager: [phone] 02/18/2018, 8:50 AM    ATTENDING ATTESTATION  I have seen, examined and evaluated the patient this PM along with Kathyrn Drown, NP-C .  After reviewing all the available data and chart, we discussed the patients laboratory, study  & physical findings as well as symptoms in detail. I agree with her findings, examination as well as impression recommendations as per our discussion.    Echocardiogram performed looks relatively good.   Now currently is actually in atrial flutter with variable block rates in the 60s.  Feeling much better.  Less dyspneic.  Diastolic heart failure seems to be stable, will hold on further diuresis. Beta-blocker started yesterday, now increasing the dose along with converting from IV to p.o. diltiazem.  Major issue is what to do about anticoagulation.  The plan for now is to titrate off of Tegretol and start [place].  However we will not will start [place] back while on weaning doses of Tegretol.  Potential options for anticoagulation in the short-term are: Subcutaneous Lovenox injections until off of Tegretol versus short course roughly 1 month to 6 weeks of Coumadin that will allow [place] to potentially consider anticoagulation with him within a month -with Lovenox bridging, and conversion to  DOAC once taper off of Tegretol is complete.  We will see how she does on the first dose or 2 of Keppra to see if this is a viable option.  For now we will continue IV heparin with plans to potentially initiate either Lovenox or and/or Coumadin tomorrow. I do not think that she would require TEE cardioversion now that she is rate controlled.  Her current EKG has significant ST depressions and T wave inversions which would suggest the need for an outpatient stress test evaluation.  Hopefully will proceed toward discharge Saturday at the latest but hopefully tomorrow.     Glenetta Hew, M.D., M.S. Interventional Cardiologist   Pager # [phone] Phone # [phone] [address] [place], [place] [address]     For questions or updates, please contact   Please consult [email or web address] for contact info under Cardiology/STEMI.

## 2018-02-18 NOTE — Progress Notes (Signed)
Patient HR now in the 50's, NP [facility] notified via amion, Cardizem drip stop.

## 2018-02-18 NOTE — Progress Notes (Signed)
ANTICOAGULATION CONSULT NOTE - Follow Up Consult  Pharmacy Consult for heparin Indication: atrial fibrillation  Labs: Recent Labs    02/17/18 1345 02/17/18 1833 02/18/18 0027  HGB 13.1  --   --   HCT 41.1  --   --   PLT 335  --   --   APTT  --  31  --   LABPROT  --  13.4  --   INR  --  1.02  --   HEPARINUNFRC  --   --  0.92*  CREATININE 0.69  --   --   TROPONINI  --  <0.03 <0.03    Assessment: 78yo female supratherapeutic on heparin with initial dosing for Afib though lab was drawn early and may still have some effect from bolus; no gtt issues or signs of bleeding per RN.  Goal of Therapy:  Heparin level 0.3-0.7 units/ml   Plan:  Will decrease heparin gtt by 1-2 units/kg/hr to 800 units/hr and check level with next scheduled lab draw.    Wynona Neat, PharmD, BCPS  02/18/2018,1:45 AM

## 2018-02-19 ENCOUNTER — Other Ambulatory Visit: Payer: Self-pay

## 2018-02-19 ENCOUNTER — Observation Stay (HOSPITAL_COMMUNITY): Payer: Medicare Other

## 2018-02-19 DIAGNOSIS — R9431 Abnormal electrocardiogram [ECG] [EKG]: Secondary | ICD-10-CM | POA: Diagnosis present

## 2018-02-19 DIAGNOSIS — I5031 Acute diastolic (congestive) heart failure: Secondary | ICD-10-CM | POA: Diagnosis not present

## 2018-02-19 DIAGNOSIS — I4892 Unspecified atrial flutter: Secondary | ICD-10-CM | POA: Diagnosis present

## 2018-02-19 DIAGNOSIS — G40909 Epilepsy, unspecified, not intractable, without status epilepticus: Secondary | ICD-10-CM | POA: Diagnosis present

## 2018-02-19 DIAGNOSIS — Z7901 Long term (current) use of anticoagulants: Secondary | ICD-10-CM | POA: Diagnosis not present

## 2018-02-19 DIAGNOSIS — I2510 Atherosclerotic heart disease of native coronary artery without angina pectoris: Secondary | ICD-10-CM | POA: Diagnosis present

## 2018-02-19 DIAGNOSIS — I429 Cardiomyopathy, unspecified: Secondary | ICD-10-CM | POA: Diagnosis not present

## 2018-02-19 DIAGNOSIS — I428 Other cardiomyopathies: Secondary | ICD-10-CM | POA: Diagnosis not present

## 2018-02-19 DIAGNOSIS — I361 Nonrheumatic tricuspid (valve) insufficiency: Secondary | ICD-10-CM

## 2018-02-19 DIAGNOSIS — I4581 Long QT syndrome: Secondary | ICD-10-CM | POA: Diagnosis not present

## 2018-02-19 DIAGNOSIS — I10 Essential (primary) hypertension: Secondary | ICD-10-CM | POA: Diagnosis not present

## 2018-02-19 DIAGNOSIS — Z7982 Long term (current) use of aspirin: Secondary | ICD-10-CM | POA: Diagnosis not present

## 2018-02-19 DIAGNOSIS — I4891 Unspecified atrial fibrillation: Secondary | ICD-10-CM | POA: Diagnosis present

## 2018-02-19 DIAGNOSIS — K219 Gastro-esophageal reflux disease without esophagitis: Secondary | ICD-10-CM | POA: Diagnosis present

## 2018-02-19 DIAGNOSIS — Z6828 Body mass index (BMI) 28.0-28.9, adult: Secondary | ICD-10-CM | POA: Diagnosis not present

## 2018-02-19 DIAGNOSIS — E663 Overweight: Secondary | ICD-10-CM | POA: Diagnosis present

## 2018-02-19 DIAGNOSIS — I5041 Acute combined systolic (congestive) and diastolic (congestive) heart failure: Secondary | ICD-10-CM | POA: Diagnosis present

## 2018-02-19 DIAGNOSIS — I272 Pulmonary hypertension, unspecified: Secondary | ICD-10-CM | POA: Diagnosis present

## 2018-02-19 DIAGNOSIS — E785 Hyperlipidemia, unspecified: Secondary | ICD-10-CM | POA: Diagnosis present

## 2018-02-19 DIAGNOSIS — E876 Hypokalemia: Secondary | ICD-10-CM | POA: Diagnosis present

## 2018-02-19 DIAGNOSIS — I483 Typical atrial flutter: Secondary | ICD-10-CM | POA: Diagnosis not present

## 2018-02-19 DIAGNOSIS — I351 Nonrheumatic aortic (valve) insufficiency: Secondary | ICD-10-CM | POA: Diagnosis not present

## 2018-02-19 DIAGNOSIS — I484 Atypical atrial flutter: Secondary | ICD-10-CM | POA: Diagnosis not present

## 2018-02-19 DIAGNOSIS — I110 Hypertensive heart disease with heart failure: Secondary | ICD-10-CM | POA: Diagnosis present

## 2018-02-19 DIAGNOSIS — I340 Nonrheumatic mitral (valve) insufficiency: Secondary | ICD-10-CM

## 2018-02-19 LAB — CBC
HCT: 37.8 % (ref 36.0–46.0)
Hemoglobin: 11.9 g/dL — ABNORMAL LOW (ref 12.0–15.0)
MCH: 28.5 pg (ref 26.0–34.0)
MCHC: 31.5 g/dL (ref 30.0–36.0)
MCV: 90.4 fL (ref 80.0–100.0)
Platelets: 273 10*3/uL (ref 150–400)
RBC: 4.18 MIL/uL (ref 3.87–5.11)
RDW: 13.8 % (ref 11.5–15.5)
WBC: 6.3 10*3/uL (ref 4.0–10.5)
nRBC: 0 % (ref 0.0–0.2)

## 2018-02-19 LAB — HEPARIN LEVEL (UNFRACTIONATED): Heparin Unfractionated: <0.1 IU/mL — ABNORMAL LOW (ref 0.30–0.70)

## 2018-02-19 LAB — ECHOCARDIOGRAM COMPLETE
Height: 61 in
Weight: 2454.4 oz

## 2018-02-19 MED ORDER — ENOXAPARIN SODIUM 60 MG/0.6ML ~~LOC~~ SOLN
60.0000 mg | Freq: Two times a day (BID) | SUBCUTANEOUS | Status: DC
  Administered 2018-02-19 – 2018-02-22 (×6): 60 mg via SUBCUTANEOUS
  Filled 2018-02-19 (×6): qty 0.6

## 2018-02-19 MED ORDER — FUROSEMIDE 20 MG PO TABS
20.0000 mg | ORAL_TABLET | Freq: Every day | ORAL | Status: DC
  Administered 2018-02-19 – 2018-02-21 (×3): 20 mg via ORAL
  Filled 2018-02-19 (×3): qty 1

## 2018-02-19 MED ORDER — METOPROLOL TARTRATE 5 MG/5ML IV SOLN
5.0000 mg | INTRAVENOUS | Status: DC | PRN
  Filled 2018-02-19: qty 5

## 2018-02-19 MED ORDER — WARFARIN - PHARMACIST DOSING INPATIENT
Freq: Every day | Status: DC
  Administered 2018-02-19: 19:00:00

## 2018-02-19 MED ORDER — METOPROLOL TARTRATE 5 MG/5ML IV SOLN
5.0000 mg | Freq: Once | INTRAVENOUS | Status: AC
  Administered 2018-02-19: 5 mg via INTRAVENOUS
  Filled 2018-02-19: qty 5

## 2018-02-19 MED ORDER — WARFARIN SODIUM 5 MG PO TABS
5.0000 mg | ORAL_TABLET | Freq: Once | ORAL | Status: AC
  Administered 2018-02-19: 5 mg via ORAL
  Filled 2018-02-19: qty 1

## 2018-02-19 MED ORDER — METOPROLOL TARTRATE 50 MG PO TABS
75.0000 mg | ORAL_TABLET | Freq: Two times a day (BID) | ORAL | Status: DC
  Administered 2018-02-19 – 2018-02-20 (×3): 75 mg via ORAL
  Filled 2018-02-19 (×3): qty 1

## 2018-02-19 MED ORDER — HEPARIN BOLUS VIA INFUSION
1850.0000 [IU] | Freq: Once | INTRAVENOUS | Status: AC
  Administered 2018-02-19: 1850 [IU] via INTRAVENOUS
  Filled 2018-02-19: qty 1850

## 2018-02-19 NOTE — Progress Notes (Signed)
  Echocardiogram 2D Echocardiogram has been performed.  Johny Chess 02/19/2018, 12:22 PM

## 2018-02-19 NOTE — Progress Notes (Signed)
ANTICOAGULATION CONSULT NOTE - Follow Up Consult  Pharmacy Consult for heparin Indication: atrial fibrillation  Labs: Recent Labs    02/17/18 1345 02/17/18 1833 02/18/18 0027 02/18/18 0604 02/19/18 0624  HGB 13.1  --   --  12.3 11.9*  HCT 41.1  --   --  38.0 37.8  PLT 335  --   --  306 273  APTT  --  31  --   --   --   LABPROT  --  13.4  --   --   --   INR  --  1.02  --   --   --   HEPARINUNFRC  --   --  0.92* 0.70 <0.10*  CREATININE 0.69  --   --  0.84  --   TROPONINI  --  <0.03 <0.03 <0.03  --     Assessment: 78yo female on heparin for new onset afib.   Heparin level this AM subtherapeutic at < 0.10 on 700 units/hr. Confirmed that infusion has been running continuously, and that there are no issues with heparin infusion per RN. Hgb slightly down from 12.3 to 11.9, and platelet count remains stable at 273. No signs/symptoms of bleeding reported.   Goal of Therapy:  Heparin level 0.3-0.7 units/ml   Plan:  Heparin infusion bolus of 1850 units x 1 Increase heparin infusion to 900 units/hr Check 8 hour heparin level Daily heparin level/CBC  Follow up plans for oral anticoagulation  Thank you for allowing pharmacy to be a part of this patient's care.  Leron Croak, PharmD PGY1 Pharmacy Resident Phone: [phone]  Please check AMION for all [place] phone numbers 02/19/2018 8:51 AM

## 2018-02-19 NOTE — Care Management (Addendum)
#   7.  S/W   ADALINE  @ [facility] # [phone]   1. LOVENOX  60 MG SQ BID  COVER- YES CO-PAY-  25 % OF TOTAL COST   Q/L TWO SYRINGES  PER DAY TIER- 4 DRUG PRIOR APPROVAL- NO   2. ENOXAPARIN 60 MG SQ BID COVER- YES CO-PAY- 25 % OF TOTAL COST   Q/L TWO SYRINGES PER DAY TIER- 3 DRUG PRIOR APPROVAL- NO  NO DEDUCTIBLE AND INITIAL COVERAGE  PREFERRED PHARMACY : YES  -  [facility]

## 2018-02-19 NOTE — Progress Notes (Signed)
Benefit check in progress for lovenox 60 mg SQ BID x 10 doses; B Pennie Rushing [phone]

## 2018-02-19 NOTE — Progress Notes (Addendum)
ANTICOAGULATION CONSULT NOTE - Initial Consult  Pharmacy Consult for warfarin/lovenox bridge Indication: atrial fibrillation  Allergies  Allergen Reactions  . Dilantin [Phenytoin Sodium Extended]      Stupor, ataxia.   Marland Kitchen Hydrocodone Nausea And Vomiting    Patient Measurements: Height: 5\' 1"  (154.9 cm) Weight: 153 lb 6.4 oz (69.6 kg)(scale b) IBW/kg (Calculated) : 47.8 Heparin Dosing Weight: 62.9 kg  Vital Signs: Temp: 97.5 F (36.4 C) (01/31 0458) Temp Source: Oral (01/31 0458) BP: 114/84 (01/31 0458) Pulse Rate: 114 (01/31 0458)  Labs: Recent Labs    02/17/18 1345 02/17/18 1833 02/18/18 0027 02/18/18 0604 02/19/18 0624  HGB 13.1  --   --  12.3 11.9*  HCT 41.1  --   --  38.0 37.8  PLT 335  --   --  306 273  APTT  --  31  --   --   --   LABPROT  --  13.4  --   --   --   INR  --  1.02  --   --   --   HEPARINUNFRC  --   --  0.92* 0.70 <0.10*  CREATININE 0.69  --   --  0.84  --   TROPONINI  --  <0.03 <0.03 <0.03  --     Estimated Creatinine Clearance: 48.4 mL/min (by C-G formula based on SCr of 0.84 mg/dL).   Medical History: Past Medical History:  Diagnosis Date  . Benign tumor of meninges ([place])   . GERD (gastroesophageal reflux disease)    occ. in past  . Headache(784.0)   . HTN (hypertension), benign    pt. states no hypertension  . Pneumonia    ? as child  . PONV (postoperative nausea and vomiting)   . Seizures ([place])    positive for HBP    Medications:  Scheduled:  . carbamazepine  100 mg Oral QHS  . [START ON 02/22/2018] carbamazepine  100 mg Oral BID   Followed by  . [START ON 02/26/2018] carbamazepine  100 mg Oral Daily  . carbamazepine  200 mg Oral Daily  . diltiazem  360 mg Oral Daily  . furosemide  20 mg Oral Daily  . levETIRAcetam  500 mg Oral BID  . metoprolol tartrate  5 mg Intravenous Once  . metoprolol tartrate  75 mg Oral BID  . pravastatin  40 mg Oral Daily   Infusions:  . heparin 900 Units/hr (02/19/18 0848)     Assessment: 78yo female with new onset afib. Patient is to be switched from heparin infusion to warfarin with lovenox bridge starting tonight for new onset atrial fibrillation. Patient will need enoxaparin (Lovenox) 60 mg syringes #10 at discharge and education for lovenox injections.  Hgb slightly down from 12.3 to 11.9, and platelet count remains stable at 273. No signs/symptoms of bleeding reported. Patient's baseline INR is 1.02.   Goal of Therapy:  INR 2-3 Monitor platelets by anticoagulation protocol: Yes   Plan:  Stop heparin infusion on 02/19/18 at 1700 Start lovenox 60 mg SQ BID starting on 02/19/18 at 1800 (1 hour after heparin infusion is stopped) Give warfarin 5 mg PO x 1 tonight Patient should have INR checked outpatient in 5 days Monitor for s/sx of bleeding  Thank you for allowing pharmacy to be a part of this patient's care.  Leron Croak, PharmD PGY1 Pharmacy Resident Phone: [phone]  Please check AMION for all [place] phone numbers 02/19/2018,11:32 AM

## 2018-02-19 NOTE — Progress Notes (Addendum)
Progress Note  Patient Name: Maureen [name] Date of Encounter: 02/19/2018  Primary Cardiologist: Glenetta Hew, MD   Subjective   Again went into aflutter RVR last night however asymptomatic.   Inpatient Medications    Scheduled Meds: . carbamazepine  100 mg Oral QHS  . [START ON 02/22/2018] carbamazepine  100 mg Oral BID   Followed by  . [START ON 02/26/2018] carbamazepine  100 mg Oral Daily  . carbamazepine  200 mg Oral Daily  . diltiazem  360 mg Oral Daily  . furosemide  20 mg Oral Daily  . levETIRAcetam  500 mg Oral BID  . metoprolol tartrate  75 mg Oral BID  . pravastatin  40 mg Oral Daily   Continuous Infusions: . heparin 900 Units/hr (02/19/18 0848)   PRN Meds: acetaminophen, albuterol, ondansetron (ZOFRAN) IV   Vital Signs    Vitals:   02/18/18 0800 02/18/18 1103 02/18/18 2023 02/19/18 0458  BP: (!) 124/97 115/72 115/90 114/84  Pulse: 79 61 (!) 120 (!) 114  Resp: (!) 21 (!) 21 18 18   Temp: 98 F (36.7 C) 98.3 F (36.8 C) (!) 97.5 F (36.4 C) (!) 97.5 F (36.4 C)  TempSrc: Oral Oral Oral Oral  SpO2: 94% 97% 95% 93%  Weight:    69.6 kg  Height:        Intake/Output Summary (Last 24 hours) at 02/19/2018 0905 Last data filed at 02/19/2018 0500 Gross per 24 hour  Intake 360 ml  Output 400 ml  Net -40 ml   Last 3 Weights 02/19/2018 02/18/2018 02/17/2018  Weight (lbs) 153 lb 6.4 oz 152 lb 9.6 oz 155 lb  Weight (kg) 69.582 kg 69.219 kg 70.308 kg      Telemetry    Aflutter at 110s - Personally Reviewed  ECG    N/A  Physical Exam   GEN: No acute distress.   Neck: No JVD Cardiac: Regular tachycardic, no murmurs, rubs, or gallops.  Respiratory: Clear to auscultation bilaterally. GI: Soft, nontender, non-distended  MS: No edema; No deformity. Neuro:  Nonfocal  Psych: Normal affect   Labs    Chemistry Recent Labs  Lab 02/17/18 1345 02/18/18 0604  NA 135 138  K 4.2 3.8  CL 98 102  CO2 24 28  GLUCOSE 111* 119*  BUN 16 15  CREATININE 0.69  0.84  CALCIUM 9.4 8.7*  GFRNONAA >60 >60  GFRAA >60 >60  ANIONGAP 13 8     Hematology Recent Labs  Lab 02/17/18 1345 02/18/18 0604 02/19/18 0624  WBC 7.5 8.7 6.3  RBC 4.52 4.26 4.18  HGB 13.1 12.3 11.9*  HCT 41.1 38.0 37.8  MCV 90.9 89.2 90.4  MCH 29.0 28.9 28.5  MCHC 31.9 32.4 31.5  RDW 13.5 13.7 13.8  PLT 335 306 273    Cardiac Enzymes Recent Labs  Lab 02/17/18 1833 02/18/18 0027 02/18/18 0604  TROPONINI <0.03 <0.03 <0.03    Recent Labs  Lab 02/17/18 1353  TROPIPOC 0.02     BNP Recent Labs  Lab 02/17/18 1833  BNP 232.7*     Radiology    Dg Chest 2 View  Result Date: 02/17/2018 CLINICAL DATA:  Initial evaluation for acute shortness of breath with cough. EXAM: CHEST - 2 VIEW COMPARISON:  Prior radiograph from 11/13/2017 FINDINGS: Transverse heart size at the upper limits of normal. Mediastinal silhouette within normal limits. Lungs normally inflated. Mild scattered bibasilar subsegmental atelectasis. No consolidative opacity. No pulmonary edema. Probable small right pleural effusion seen layering posteriorly  on lateral projection. No pneumothorax. No acute osseous finding. IMPRESSION: 1. Probable small layering right pleural effusion. 2. Mild bibasilar subsegmental atelectasis. No other active cardiopulmonary disease. Electronically Signed   By: Jeannine Boga M.D.   On: 02/17/2018 15:16    Cardiac Studies   Pending echo  Patient Profile     78 y.o. female with a hx of HTN, GERD, benign meninges tumor and history of seizureswho is admitted evaluation ofnew onset atrial fibrillation/flutter.  Patient presented initially to her PCP office with complaints of dyspnea on exertionand at rest with associated, LE swelling andfatigue.  EKG with 2:1 A flutter and was therefore sent to the ED for further evaluation. Repeat EKG here at [facility] appears to be consistent with AF with RVR   Assessment & Plan    1. New onset atrial fibrillation/Flutter -  Initially started on cardizem gtt which discontinued yesterday with improved rate however again went into aflutter RVR last night.  - Seem Afib/Flutter in slow rate and aflutter in RVR.  - Currently in aflutter at 120s on Cardizem CD 360mg  and metoprolol 50mg  BID. - Patient is asymptomatic. Will increase metoprolol to 75mg  BID (and give 2 additional boluses of IV metoprolol 5 mg today).   Continue cardizem po CD 360mg  daily, if rate still elevated>> will restart Cardizem gtt vs PRN IV medications.   - Avoid NOAC until wean off Tegretol>> appreciate neurology help - Continue heparin gtt with plan to transition to [place] with coumadin bridge tonight/tomorrow. ->  Initiate Lovenox education today. - Eventually transition to [place] (can start on 2/17) when off Tegretol taper --  --we will plan 6 weeks coverage off of DOAC and then convert to DOAC.  (This will allow washout of Tegretol). - CHA2DS2VASc =4(hypertension, age, sex) - Will get echocardiogram, TSH normal  2. Seizure disorder --appreciate neurology assistance and recommendations.  Plan is to convert from Tegretol to [place] with weaning off of Tegretol. - as above  3. Presume acute diastolic heart failure - likely rate related. Now on low dose diuretics. Euvolemic. Pending echo.   4. Abnormal EKG - EKG has significant ST depressions and T wave inversions which would suggest the need for an outpatient stress test evaluation.     SignedCrista Luria Grenelefe, PA  02/19/2018, 9:05 AM    ATTENDING ATTESTATION  I have seen, examined and evaluated the patient this AM along with Leanor Kail, PA .  After reviewing all the available data and chart, we discussed the patients laboratory, study & physical findings as well as symptoms in detail. I agree with his findings, examination as well as impression recommendations as per our discussion.    Attending adjustments noted in italics.   Maureen [name] feels much better today, unfortunately the  conversion to oral diltiazem did not give [place] enough coverage and she went back fast again.  We will give her a couple dose of IV beta-blocker today to see if he can slow down and increase the dose. I think with mostly atrial flutter, is clearly difficult for her to spontaneously convert and may very well need cardioversion. Plan for now will be to to Lovenox bridge with Coumadin to allow for about 6 weeks coverage while bridging off of Tegretol.  Can then start [place] a couple weeks after completion of Tegretol taper. --Hopefully, if her rate is better controlled tomorrow, we can consider discharge home and then potentially TEE cardioversion next week once we have started Coumadin in order to avoid her erratic heart rates  with A. fib flutter.  However, if her rate is not controlled with potentially keep her over the weekend and we have tentatively schedule TEE cardioversion for Monday.  Once she is cardioverted/converted, would probably check stress test in the outpatient setting, however she is relatively asymptomatic despite being rapid heart rate.    Glenetta Hew, M.D., M.S. Interventional Cardiologist   Pager # [phone] Phone # [phone] [address] [place], [place] [address]     For questions or updates, please contact [place] Please consult [email or web address] for contact info under

## 2018-02-20 DIAGNOSIS — I5041 Acute combined systolic (congestive) and diastolic (congestive) heart failure: Secondary | ICD-10-CM

## 2018-02-20 LAB — CBC
HCT: 35.8 % — ABNORMAL LOW (ref 36.0–46.0)
Hemoglobin: 11.7 g/dL — ABNORMAL LOW (ref 12.0–15.0)
MCH: 29.5 pg (ref 26.0–34.0)
MCHC: 32.7 g/dL (ref 30.0–36.0)
MCV: 90.2 fL (ref 80.0–100.0)
Platelets: 282 10*3/uL (ref 150–400)
RBC: 3.97 MIL/uL (ref 3.87–5.11)
RDW: 13.8 % (ref 11.5–15.5)
WBC: 8.1 10*3/uL (ref 4.0–10.5)
nRBC: 0 % (ref 0.0–0.2)

## 2018-02-20 LAB — PROTIME-INR
INR: 1.16
Prothrombin Time: 14.7 seconds (ref 11.4–15.2)

## 2018-02-20 MED ORDER — METOPROLOL TARTRATE 100 MG PO TABS
100.0000 mg | ORAL_TABLET | Freq: Two times a day (BID) | ORAL | Status: DC
  Administered 2018-02-20 – 2018-02-22 (×4): 100 mg via ORAL
  Filled 2018-02-20 (×4): qty 1

## 2018-02-20 MED ORDER — METOPROLOL TARTRATE 25 MG PO TABS
25.0000 mg | ORAL_TABLET | Freq: Once | ORAL | Status: AC
  Administered 2018-02-20: 25 mg via ORAL
  Filled 2018-02-20: qty 1

## 2018-02-20 MED ORDER — WARFARIN SODIUM 5 MG PO TABS: 5.0000 mg | ORAL_TABLET | Freq: Once | ORAL | Status: DC

## 2018-02-20 NOTE — Progress Notes (Signed)
Progress Note  Patient Name: Maureen [name] Date of Encounter: 02/20/2018  Primary Cardiologist: Glenetta Hew, MD   Subjective   Feeling better.  Breathing has improved.  Denies palpitations.  Inpatient Medications    Scheduled Meds: . carbamazepine  100 mg Oral QHS  . [START ON 02/22/2018] carbamazepine  100 mg Oral BID   Followed by  . [START ON 02/26/2018] carbamazepine  100 mg Oral Daily  . carbamazepine  200 mg Oral Daily  . diltiazem  360 mg Oral Daily  . enoxaparin (LOVENOX) injection  60 mg Subcutaneous Q12H  . furosemide  20 mg Oral Daily  . levETIRAcetam  500 mg Oral BID  . metoprolol tartrate  75 mg Oral BID  . pravastatin  40 mg Oral Daily  . warfarin  5 mg Oral ONCE-1800  . Warfarin - Pharmacist Dosing Inpatient   Does not apply q1800   Continuous Infusions:  PRN Meds: acetaminophen, albuterol, metoprolol tartrate, ondansetron (ZOFRAN) IV   Vital Signs    Vitals:   02/19/18 1835 02/19/18 1941 02/19/18 2138 02/20/18 0422  BP: 137/90 117/76  109/80  Pulse: (!) 115 (!) 115 (!) 115 (!) 115  Resp:  18  18  Temp:  98.1 F (36.7 C)  (!) 97.4 F (36.3 C)  TempSrc:  Oral  Oral  SpO2:  94%  96%  Weight:    69.5 kg  Height:        Intake/Output Summary (Last 24 hours) at 02/20/2018 1223 Last data filed at 02/20/2018 0845 Gross per 24 hour  Intake 720 ml  Output -  Net 720 ml   Last 3 Weights 02/20/2018 02/19/2018 02/18/2018  Weight (lbs) 153 lb 3.2 oz 153 lb 6.4 oz 152 lb 9.6 oz  Weight (kg) 69.491 kg 69.582 kg 69.219 kg      Telemetry    Atrial flutter.  Rat 60s-110s.  2.5 second pause.  - Personally Reviewed  ECG    Atrial flutter.  Ventricular rate 64 bpm. Inferolateral ST depression and TWI. - Personally Reviewed  Physical Exam   VS:  BP 104/84 (BP Location: Left Arm)   Pulse (!) 118   Temp 97.8 F (36.6 C) (Oral)   Resp 18   Ht 5\' 1"  (1.549 m)   Wt 69.5 kg Comment: scale b  SpO2 95%   BMI 28.95 kg/m  , BMI Body mass index is 28.95  kg/m. GENERAL:  Well appearing HEENT: Pupils equal round and reactive, fundi not visualized, oral mucosa unremarkable NECK:  No jugular venous distention, waveform within normal limits, carotid upstroke brisk and symmetric, no bruits LUNGS:  Clear to auscultation bilaterally HEART:  Tachycardic.  Irregularly irregular.  PMI not displaced or sustained,S1 and S2 within normal limits, no S3, no S4, no clicks, no rubs, no murmurs ABD:  Flat, positive bowel sounds normal in frequency in pitch, no bruits, no rebound, no guarding, no midline pulsatile mass, no hepatomegaly, no splenomegaly EXT:  2 plus pulses throughout, no edema, no cyanosis no clubbing SKIN:  No rashes no nodules NEURO:  Cranial nerves II through XII grossly intact, motor grossly intact throughout PSYCH:  Cognitively intact, oriented to person place and time   Labs    Chemistry Recent Labs  Lab 02/17/18 1345 02/18/18 0604  NA 135 138  K 4.2 3.8  CL 98 102  CO2 24 28  GLUCOSE 111* 119*  BUN 16 15  CREATININE 0.69 0.84  CALCIUM 9.4 8.7*  GFRNONAA >60 >60  GFRAA >  60 >60  ANIONGAP 13 8     Hematology Recent Labs  Lab 02/18/18 0604 02/19/18 0624 02/20/18 0553  WBC 8.7 6.3 8.1  RBC 4.26 4.18 3.97  HGB 12.3 11.9* 11.7*  HCT 38.0 37.8 35.8*  MCV 89.2 90.4 90.2  MCH 28.9 28.5 29.5  MCHC 32.4 31.5 32.7  RDW 13.7 13.8 13.8  PLT 306 273 282    Cardiac Enzymes Recent Labs  Lab 02/17/18 1833 02/18/18 0027 02/18/18 0604  TROPONINI <0.03 <0.03 <0.03    Recent Labs  Lab 02/17/18 1353  TROPIPOC 0.02     BNP Recent Labs  Lab 02/17/18 1833  BNP 232.7*     DDimer No results for input(s): DDIMER in the last 168 hours.   Radiology    No results found.  Cardiac Studies   Echo 02/19/18: IMPRESSIONS   1. The left ventricle has moderately reduced systolic function of 19-14%. The cavity size is normal. There is no left ventricular wall thickness. Echo evidence of Unable to assess diastolic filling  patterns.  2. The right ventricle is in size. There is severely reduced systolic function.  3. Severely dilated left atrial size.  4. Mildly dilated right atrial size.  5. The mitral valve normal in structure. Regurgitation is mild to moderate by color flow Doppler.  6. Normal tricuspid valve.  7. Tricuspid regurgitation is mild.  8. The aortic valve tricuspid. Aortic valve regurgitation is mild by color flow Doppler.  9. Pulmonic valve regurgitation is mild by color flow Doppler. 10. No atrial level shunt detected by color flow Doppler.  Patient Profile     78 y.o. female with hypertension, GERD and benign meningeal tumor and seizures here with new onset atrial flutter.    Assessment & Plan    # New onset atrial flutter: Remains in atrial flutter.  Rates were better controlled but are now back in the 100s.  LVEF is reduced on echo this admission, so would prefer to avoid diltiazem for now.  She had a couple asymptomatic pauses up to 2.5s.  Continue diltiazem and metoprolol for now.  Will increase metoprolol to 100mg  bid.  Consolidate to succinate if she tolerates.  Plan to get TEE/DCCV this admission after cath and hopefully stop diltiazem. LA is severely enlarged. May require antiarrhythmic to maintain sinus rhythm.  Continue heparin.  She has been on Tegretol and is switching to Keppra.  Will require a bridge with warfarin for now and can start [place] in a couple weeks once Tegretol has been discontinued.  Warfarin was started last night but will hold the dose tonight and resume tomorrow given that she will need cath Monday.  # Acute systolic and diastolic heart failure: LVEF 35-40% on echo this admission.  May be tachycardia related.  Significant inferolateral ST depression and TWI on EKG.  Will plan for LHC, hopefully Monday.  She is euvolemic.  # Hypertension: BP controlled on current meds.   For questions or updates, please contact [place] Please consult [email or web address] for  contact info under        Signed, Skeet Latch, MD  02/20/2018, 12:23 PM

## 2018-02-20 NOTE — Progress Notes (Signed)
ANTICOAGULATION CONSULT NOTE - Initial Consult  Pharmacy Consult for warfarin/lovenox bridge Indication: atrial fibrillation  Allergies  Allergen Reactions  . Dilantin [Phenytoin Sodium Extended]      Stupor, ataxia.   Marland Kitchen Hydrocodone Nausea And Vomiting    Patient Measurements: Height: 5\' 1"  (154.9 cm) Weight: 153 lb 3.2 oz (69.5 kg)(scale b) IBW/kg (Calculated) : 47.8 Heparin Dosing Weight: 62.9 kg  Vital Signs: Temp: 97.4 F (36.3 C) (02/01 0422) Temp Source: Oral (02/01 0422) BP: 109/80 (02/01 0422) Pulse Rate: 115 (02/01 0422)  Labs: Recent Labs    02/17/18 1345 02/17/18 1833 02/18/18 0027 02/18/18 0604 02/19/18 0624 02/20/18 0553  HGB 13.1  --   --  12.3 11.9* 11.7*  HCT 41.1  --   --  38.0 37.8 35.8*  PLT 335  --   --  306 273 282  APTT  --  31  --   --   --   --   LABPROT  --  13.4  --   --   --  14.7  INR  --  1.02  --   --   --  1.16  HEPARINUNFRC  --   --  0.92* 0.70 <0.10*  --   CREATININE 0.69  --   --  0.84  --   --   TROPONINI  --  <0.03 <0.03 <0.03  --   --     Estimated Creatinine Clearance: 48.4 mL/min (by C-G formula based on SCr of 0.84 mg/dL).   Medical History: Past Medical History:  Diagnosis Date  . Benign tumor of meninges ([place])   . GERD (gastroesophageal reflux disease)    occ. in past  . Headache(784.0)   . HTN (hypertension), benign    pt. states no hypertension  . Pneumonia    ? as child  . PONV (postoperative nausea and vomiting)   . Seizures ([place])    positive for HBP    Medications:  Scheduled:  . carbamazepine  100 mg Oral QHS  . [START ON 02/22/2018] carbamazepine  100 mg Oral BID   Followed by  . [START ON 02/26/2018] carbamazepine  100 mg Oral Daily  . carbamazepine  200 mg Oral Daily  . diltiazem  360 mg Oral Daily  . enoxaparin (LOVENOX) injection  60 mg Subcutaneous Q12H  . furosemide  20 mg Oral Daily  . levETIRAcetam  500 mg Oral BID  . metoprolol tartrate  75 mg Oral BID  . pravastatin  40 mg Oral Daily  .  Warfarin - Pharmacist Dosing Inpatient   Does not apply q1800   Infusions:    Assessment: 78yo female with new onset afib. Patientswitched from heparin infusion to warfarin with lovenox bridge 02/19/18 PM for new onset atrial fibrillation. Patient will need enoxaparin (Lovenox) 60 mg syringes #10 at discharge and education for lovenox injections.  Of note, patient is currently being tapered off of carbamazepine. INR will need to be closely followed at discharge as carbamazepine induces the metabolism of warfarin, thus causing a decreased INR. Would expect as carbamazepine is tapered off that patient's INR will increase.   CBC remains low/stable. No signs/symptoms of bleeding reported. Patient's baseline INR reported as 1.02 on 02/17/18.   Goal of Therapy:  INR 2-3 Monitor platelets by anticoagulation protocol: Yes   Plan:  Give warfarin 5 mg PO x 1 tonight Continue lovenox 60 mg SQ BID for a minimum of 5 days and until INR is 2 or greater for at least 24 hours  Patient should have INR checked outpatient 5 days after initiation of warfarin/lovenox bridge Monitor for s/sx of bleeding  Thank you for allowing pharmacy to be a part of this patient's care.  Leron Croak, PharmD PGY1 Pharmacy Resident Phone: [phone]  Please check AMION for all [place] phone numbers 02/20/2018,7:35 AM

## 2018-02-21 LAB — CBC
HCT: 39.2 % (ref 36.0–46.0)
Hemoglobin: 12.4 g/dL (ref 12.0–15.0)
MCH: 28.7 pg (ref 26.0–34.0)
MCHC: 31.6 g/dL (ref 30.0–36.0)
MCV: 90.7 fL (ref 80.0–100.0)
Platelets: 306 10*3/uL (ref 150–400)
RBC: 4.32 MIL/uL (ref 3.87–5.11)
RDW: 13.8 % (ref 11.5–15.5)
WBC: 7 10*3/uL (ref 4.0–10.5)
nRBC: 0 % (ref 0.0–0.2)

## 2018-02-21 LAB — PROTIME-INR
INR: 1.1
Prothrombin Time: 14.1 seconds (ref 11.4–15.2)

## 2018-02-21 MED ORDER — AMIODARONE HCL 200 MG PO TABS: 200.0000 mg | ORAL_TABLET | Freq: Two times a day (BID) | ORAL | Status: DC

## 2018-02-21 MED ORDER — ASPIRIN 81 MG PO CHEW
81.0000 mg | CHEWABLE_TABLET | ORAL | Status: AC
  Administered 2018-02-22: 81 mg via ORAL
  Filled 2018-02-21: qty 1

## 2018-02-21 MED ORDER — AMIODARONE HCL 200 MG PO TABS
400.0000 mg | ORAL_TABLET | Freq: Two times a day (BID) | ORAL | Status: DC
  Administered 2018-02-21 – 2018-02-22 (×3): 400 mg via ORAL
  Filled 2018-02-21 (×3): qty 2

## 2018-02-21 MED ORDER — WARFARIN - PHARMACIST DOSING INPATIENT
Freq: Every day | Status: DC
  Administered 2018-02-21: 22:00:00

## 2018-02-21 MED ORDER — SODIUM CHLORIDE 0.9 % IV SOLN: 250.0000 mL | INTRAVENOUS | Status: DC | PRN

## 2018-02-21 MED ORDER — SODIUM CHLORIDE 0.9% FLUSH
3.0000 mL | Freq: Two times a day (BID) | INTRAVENOUS | Status: DC
  Administered 2018-02-21: 3 mL via INTRAVENOUS

## 2018-02-21 MED ORDER — SODIUM CHLORIDE 0.9% FLUSH: 3.0000 mL | INTRAVENOUS | Status: DC | PRN

## 2018-02-21 MED ORDER — SODIUM CHLORIDE 0.9 % IV SOLN
INTRAVENOUS | Status: DC
  Administered 2018-02-22: 06:00:00 via INTRAVENOUS

## 2018-02-21 MED ORDER — WARFARIN SODIUM 5 MG PO TABS
5.0000 mg | ORAL_TABLET | Freq: Once | ORAL | Status: AC
  Administered 2018-02-21: 5 mg via ORAL
  Filled 2018-02-21: qty 1

## 2018-02-21 NOTE — Progress Notes (Signed)
FYI for nursing:  Per Cards, patient will have cardiac catheterization tomorrow, Monday. Patient will then have TEE on Tuesday.

## 2018-02-21 NOTE — Progress Notes (Signed)
Progress Note  Patient Name: Maureen [name] Date of Encounter: 02/21/2018  Primary Cardiologist: Glenetta Hew, MD   Subjective   Had difficulty sleeping overnight.  Short of breath when lying down.  History of dysphagia that seems to be getting worse.   Inpatient Medications    Scheduled Meds: . carbamazepine  100 mg Oral QHS  . [START ON 02/22/2018] carbamazepine  100 mg Oral BID   Followed by  . [START ON 02/26/2018] carbamazepine  100 mg Oral Daily  . diltiazem  360 mg Oral Daily  . enoxaparin (LOVENOX) injection  60 mg Subcutaneous Q12H  . furosemide  20 mg Oral Daily  . levETIRAcetam  500 mg Oral BID  . metoprolol tartrate  100 mg Oral BID  . pravastatin  40 mg Oral Daily   Continuous Infusions:  PRN Meds: acetaminophen, albuterol, metoprolol tartrate, ondansetron (ZOFRAN) IV   Vital Signs    Vitals:   02/20/18 2015 02/20/18 2224 02/21/18 0500 02/21/18 1143  BP: 105/72  119/85 (!) 127/97  Pulse: (!) 116 (!) 118 (!) 114 (!) 121  Resp: 18  18 20   Temp: 98 F (36.7 C)  98.3 F (36.8 C) 97.7 F (36.5 C)  TempSrc: Oral  Oral Oral  SpO2: 97%  96% 97%  Weight:   69.5 kg   Height:        Intake/Output Summary (Last 24 hours) at 02/21/2018 1204 Last data filed at 02/21/2018 0900 Gross per 24 hour  Intake 820 ml  Output 1651 ml  Net -831 ml   Last 3 Weights 02/21/2018 02/20/2018 02/19/2018  Weight (lbs) 153 lb 3.2 oz 153 lb 3.2 oz 153 lb 6.4 oz  Weight (kg) 69.491 kg 69.491 kg 69.582 kg      Telemetry    Atrial flutter.  Rate 100-110s - Personally Reviewed  ECG    Atrial flutter.  Ventricular rate 64 bpm. Inferolateral ST depression and TWI. - Personally Reviewed  Physical Exam   VS:  BP (!) 127/97 (BP Location: Right Arm)   Pulse (!) 121   Temp 97.7 F (36.5 C) (Oral)   Resp 20   Ht 5\' 1"  (1.549 m)   Wt 69.5 kg   SpO2 97%   BMI 28.95 kg/m  , BMI Body mass index is 28.95 kg/m. GENERAL:  Well appearing HEENT: Pupils equal round and reactive, fundi not  visualized, oral mucosa unremarkable NECK:  No jugular venous distention, waveform within normal limits, carotid upstroke brisk and symmetric, no bruits LUNGS:  Clear to auscultation bilaterally HEART:  Tachycardic.  Irregularly irregular.  PMI not displaced or sustained,S1 and S2 within normal limits, no S3, no S4, no clicks, no rubs, no murmurs ABD:  Flat, positive bowel sounds normal in frequency in pitch, no bruits, no rebound, no guarding, no midline pulsatile mass, no hepatomegaly, no splenomegaly EXT:  2 plus pulses throughout, no edema, no cyanosis no clubbing SKIN:  No rashes no nodules NEURO:  Cranial nerves II through XII grossly intact, motor grossly intact throughout [facility]:  Cognitively intact, oriented to person place and time   Labs    Chemistry Recent Labs  Lab 02/17/18 1345 02/18/18 0604  NA 135 138  K 4.2 3.8  CL 98 102  CO2 24 28  GLUCOSE 111* 119*  BUN 16 15  CREATININE 0.69 0.84  CALCIUM 9.4 8.7*  GFRNONAA >60 >60  GFRAA >60 >60  ANIONGAP 13 8     Hematology Recent Labs  Lab 02/19/18 0624 02/20/18  6803 02/21/18 0534  WBC 6.3 8.1 7.0  RBC 4.18 3.97 4.32  HGB 11.9* 11.7* 12.4  HCT 37.8 35.8* 39.2  MCV 90.4 90.2 90.7  MCH 28.5 29.5 28.7  MCHC 31.5 32.7 31.6  RDW 13.8 13.8 13.8  PLT 273 282 306    Cardiac Enzymes Recent Labs  Lab 02/17/18 1833 02/18/18 0027 02/18/18 0604  TROPONINI <0.03 <0.03 <0.03    Recent Labs  Lab 02/17/18 1353  TROPIPOC 0.02     BNP Recent Labs  Lab 02/17/18 1833  BNP 232.7*     DDimer No results for input(s): DDIMER in the last 168 hours.   Radiology    No results found.  Cardiac Studies   Echo 02/19/18: IMPRESSIONS  1. The left ventricle has moderately reduced systolic function of 21-22%. The cavity size is normal. There is no left ventricular wall thickness. Echo evidence of Unable to assess diastolic filling patterns.  2. The right ventricle is in size. There is severely reduced systolic  function.  3. Severely dilated left atrial size.  4. Mildly dilated right atrial size.  5. The mitral valve normal in structure. Regurgitation is mild to moderate by color flow Doppler.  6. Normal tricuspid valve.  7. Tricuspid regurgitation is mild.  8. The aortic valve tricuspid. Aortic valve regurgitation is mild by color flow Doppler.  9. Pulmonic valve regurgitation is mild by color flow Doppler. 10. No atrial level shunt detected by color flow Doppler.  Patient Profile     78 y.o. female with hypertension, GERD and benign meningeal tumor and seizures here with new onset atrial flutter.    Assessment & Plan    # New onset atrial flutter: Remains in atrial flutter.  Rates are poorly controlled on max dose diltiazem and metoprolol.  LVEF is reduced on echo this admission, so would prefer to avoid diltiazem if possible.  She had a couple asymptomatic pauses up to 2.5s.  Continue diltiazem and metoprolol for now.  Plan to get TEE/DCCV this admission after cath and hopefully stop diltiazem. This may be complicated by her report of dysphagia.  LA is severely enlarged.  We will start amiodarone in the short term to achieve better rate control. She require antiarrhythmic longterm to maintain sinus rhythm.  Continue heparin.  She has been on Tegretol and is switching to Keppra.  Will require a bridge with warfarin for now and can start [place] in a couple weeks once Tegretol has been discontinued.  Warfarin was started on 1/31 but held 2/1 given the plan for cath Monday.  Will give a dose of warfarin tonight.  # Acute systolic and diastolic heart failure: LVEF 35-40% on echo this admission.  May be tachycardia related.  Significant inferolateral ST depression and TWI on EKG.  Will plan for LHC, hopefully Monday.  She is euvolemic.  # Hypertension: BP controlled on current meds.   For questions or updates, please contact [place] Please consult [email or web address] for contact info under          Signed, Skeet Latch, MD  02/21/2018, 12:04 PM

## 2018-02-21 NOTE — Progress Notes (Signed)
Patient states she had dark stool today. Pharmacy notified of dark stool. Per pharmacy, MD will decide if meds (lovenox and coumadin) should be given tonight. Cards text paged. No answer or call back at this time. Report given to night shift RN and will follow-up regarding coumadin and lovenox.

## 2018-02-21 NOTE — Progress Notes (Addendum)
Progress Note  Patient Name: Maureen [name] Date of Encounter: 02/21/2018  Primary Cardiologist: Glenetta Hew, MD   Subjective   Patient says that her breathing is better than on presentation but she still has dyspnea on exertion and mild chest tightness with exertion.  No dyspnea or chest discomfort at rest.  She did not sleep very well last night as she was mildly short of breath.  She does not think that her breathing was different between sitting up or lying down. Pt complains of some difficulty swallowing when she takes her pills, feels tight.   Inpatient Medications    Scheduled Meds: . carbamazepine  100 mg Oral QHS  . [START ON 02/22/2018] carbamazepine  100 mg Oral BID   Followed by  . [START ON 02/26/2018] carbamazepine  100 mg Oral Daily  . diltiazem  360 mg Oral Daily  . enoxaparin (LOVENOX) injection  60 mg Subcutaneous Q12H  . furosemide  20 mg Oral Daily  . levETIRAcetam  500 mg Oral BID  . metoprolol tartrate  100 mg Oral BID  . pravastatin  40 mg Oral Daily   Continuous Infusions:  PRN Meds: acetaminophen, albuterol, metoprolol tartrate, ondansetron (ZOFRAN) IV   Vital Signs    Vitals:   02/20/18 2015 02/20/18 2224 02/21/18 0500 02/21/18 1143  BP: 105/72  119/85 (!) 127/97  Pulse: (!) 116 (!) 118 (!) 114 (!) 121  Resp: 18  18 20   Temp: 98 F (36.7 C)  98.3 F (36.8 C) 97.7 F (36.5 C)  TempSrc: Oral  Oral Oral  SpO2: 97%  96% 97%  Weight:   69.5 kg   Height:        Intake/Output Summary (Last 24 hours) at 02/21/2018 1255 Last data filed at 02/21/2018 0900 Gross per 24 hour  Intake 820 ml  Output 1651 ml  Net -831 ml   Last 3 Weights 02/21/2018 02/20/2018 02/19/2018  Weight (lbs) 153 lb 3.2 oz 153 lb 3.2 oz 153 lb 6.4 oz  Weight (kg) 69.491 kg 69.491 kg 69.582 kg      Telemetry    Atrial flutter in the 110s- Personally Reviewed  ECG    No new tracings- Personally Reviewed  Physical Exam   GEN: No acute distress.   Neck: No JVD Cardiac:   Tachycardic, regular rhythm, no murmurs, rubs, or gallops.  Respiratory: Clear to auscultation bilaterally. GI: Soft, nontender, non-distended  MS: No edema; No deformity. Neuro:  Nonfocal  Psych: Normal affect   Labs    Chemistry Recent Labs  Lab 02/17/18 1345 02/18/18 0604  NA 135 138  K 4.2 3.8  CL 98 102  CO2 24 28  GLUCOSE 111* 119*  BUN 16 15  CREATININE 0.69 0.84  CALCIUM 9.4 8.7*  GFRNONAA >60 >60  GFRAA >60 >60  ANIONGAP 13 8     Hematology Recent Labs  Lab 02/19/18 0624 02/20/18 0553 02/21/18 0534  WBC 6.3 8.1 7.0  RBC 4.18 3.97 4.32  HGB 11.9* 11.7* 12.4  HCT 37.8 35.8* 39.2  MCV 90.4 90.2 90.7  MCH 28.5 29.5 28.7  MCHC 31.5 32.7 31.6  RDW 13.8 13.8 13.8  PLT 273 282 306    Cardiac Enzymes Recent Labs  Lab 02/17/18 1833 02/18/18 0027 02/18/18 0604  TROPONINI <0.03 <0.03 <0.03    Recent Labs  Lab 02/17/18 1353  TROPIPOC 0.02     BNP Recent Labs  Lab 02/17/18 1833  BNP 232.7*     DDimer No results for  input(s): DDIMER in the last 168 hours.   Radiology    No results found.  Cardiac Studies   Echo 02/19/18: IMPRESSIONS  1. The left ventricle has moderately reduced systolic function of 40-97%. The cavity size is normal. There is no left ventricular wall thickness. Echo evidence of Unable to assess diastolic filling patterns. 2. The right ventricle is in size. There is severely reduced systolic function. 3. Severely dilated left atrial size. 4. Mildly dilated right atrial size. 5. The mitral valve normal in structure. Regurgitation is mild to moderate by color flow Doppler. 6. Normal tricuspid valve. 7. Tricuspid regurgitation is mild. 8. The aortic valve tricuspid. Aortic valve regurgitation is mild by color flow Doppler. 9. Pulmonic valve regurgitation is mild by color flow Doppler. 10. No atrial level shunt detected by color flow Doppler.   Patient Profile     78 y.o. female with hypertension, GERD and benign  meningeal tumor and seizures here with new onset atrial flutter.    Assessment & Plan    1.  New onset atrial flutter -Currently on diltiazem CD 360 mg and metoprolol tartrate 100 mg twice daily (increased yesterday) for rate control.  Would like to avoid diltiazem in the long run given reduced EF. -This patients CHA2DS2-VASc Score is 5 (CHF, HTN, Age (2), female).  She is currently on subcu Lovenox for anticoagulation.  Since patient is on Tegretol we are avoiding DOAC.  The patient has been started on Keppra and neurology recommends weaning off Tegretol over 12 days.  Plan for bridge with warfarin during weaning process and can start [place] in a couple of weeks once Tegretol is fully weaned off.  Planning to start warfarin after cath done tomorrow. -Continues to be in atrial flutter in the 110's.  Blood pressure is stable -Plan to get TEE/DCCV this admission after cath and hopefully stop diltiazem.  Left atrium is severely enlarged.  May require antiarrhythmic therapy to maintain sinus rhythm. -Of note pt complaining of difficulty swallowing, would not push probe too much if difficulty passing.  2.  Acute systolic and diastolic heart failure -LVEF 35-40% by echo 02/19/2018 -May be tachycardia mediated. -Was initially given Lasix 40 mg p.o. x1, now on Lasix 20 mg p.o. daily -EKG with significant inferior lateral ST depression and T wave inversions.  Planning for left heart cath tomorrow. -Currently euvolemic. She has did not sleep well last night due to mild dyspnea, about the same lying down or sitting up. I am not convinced that this was related to fluid so much as the aflutter with RVR.  -Will discuss a dose of IV lasix today with Dr. Oval Linsey.   3. Hypertension -BP currently well controlled and stable.  4. Hyperlipidemia -On Pravastatin 40 mg daily  For questions or updates, please contact [place] Please consult [email or web address] for contact info under        Signed, Daune Perch, NP  02/21/2018, 12:55 PM

## 2018-02-21 NOTE — Progress Notes (Signed)
ANTICOAGULATION CONSULT NOTE - Initial Consult  Pharmacy Consult for warfarin/lovenox bridge Indication: atrial fibrillation  Allergies  Allergen Reactions  . Dilantin [Phenytoin Sodium Extended]      Stupor, ataxia.   Marland Kitchen Hydrocodone Nausea And Vomiting    Patient Measurements: Height: 5\' 1"  (154.9 cm) Weight: 153 lb 3.2 oz (69.5 kg) IBW/kg (Calculated) : 47.8 Heparin Dosing Weight: 62.9 kg  Vital Signs: Temp: 97.7 F (36.5 C) (02/02 1143) Temp Source: Oral (02/02 1143) BP: 127/97 (02/02 1143) Pulse Rate: 121 (02/02 1143)  Labs: Recent Labs    02/19/18 0624 02/20/18 0553 02/21/18 0534  HGB 11.9* 11.7* 12.4  HCT 37.8 35.8* 39.2  PLT 273 282 306  LABPROT  --  14.7 14.1  INR  --  1.16 1.10  HEPARINUNFRC <0.10*  --   --     Estimated Creatinine Clearance: 48.4 mL/min (by C-G formula based on SCr of 0.84 mg/dL).   Medical History: Past Medical History:  Diagnosis Date  . Benign tumor of meninges ([place])   . GERD (gastroesophageal reflux disease)    occ. in past  . Headache(784.0)   . HTN (hypertension), benign    pt. states no hypertension  . Pneumonia    ? as child  . PONV (postoperative nausea and vomiting)   . Seizures ([place])    positive for HBP    Medications:  Scheduled:  . [START ON 02/28/2018] amiodarone  200 mg Oral BID  . amiodarone  400 mg Oral BID  . carbamazepine  100 mg Oral QHS  . [START ON 02/22/2018] carbamazepine  100 mg Oral BID   Followed by  . [START ON 02/26/2018] carbamazepine  100 mg Oral Daily  . diltiazem  360 mg Oral Daily  . enoxaparin (LOVENOX) injection  60 mg Subcutaneous Q12H  . furosemide  20 mg Oral Daily  . levETIRAcetam  500 mg Oral BID  . metoprolol tartrate  100 mg Oral BID  . pravastatin  40 mg Oral Daily   Infusions:    Assessment: 78yo female with new onset afib. Patientswitched from heparin infusion to warfarin with lovenox bridge 02/19/18 PM for new onset atrial fibrillation. Patient will need enoxaparin (Lovenox)  60 mg syringes #10 at discharge and education for lovenox injections.  Of note, patient is currently being tapered off of carbamazepine. INR will need to be closely followed at discharge as carbamazepine induces the metabolism of warfarin, thus causing a decreased INR. Would expect as carbamazepine is tapered off that patient's INR will increase. Additionally, amiodarone has been started on patient beginning 02/21/18, which can also increase INR of warfarin.    Patient received first dose of warfarin on 1/31. Dose held 2/1 per MD to ensure patient wasn't supratherapeutic for planned cath on 2/3. Pharmacy has been reconsulted to resumed warfarin starting tonight. CBC remains. No signs/symptoms of bleeding reported. Patient's INR 1.10 on 2/2.   Goal of Therapy:  INR 2-3   Plan:  Give warfarin 5 mg PO x 1 tonight Continue lovenox 60 mg SQ BID for a minimum of 5 days and until INR is 2 or greater for at least 24 hours Monitor INR daily Patient should have INR checked outpatient 5 days after initiation of warfarin/lovenox bridge Monitor for s/sx of bleeding  Thank you for allowing pharmacy to be a part of this patient's care.  Leron Croak, PharmD PGY1 Pharmacy Resident Phone: [phone]  Please check AMION for all [place] phone numbers 02/21/2018,2:14 PM

## 2018-02-22 ENCOUNTER — Encounter (HOSPITAL_COMMUNITY): Payer: Self-pay | Admitting: Certified Registered Nurse Anesthetist

## 2018-02-22 ENCOUNTER — Encounter (HOSPITAL_COMMUNITY)
Admission: EM | Disposition: A | Discharge status: Home / Self Care | Payer: Self-pay | Source: Ambulatory Visit | Attending: Cardiology

## 2018-02-22 DIAGNOSIS — I429 Cardiomyopathy, unspecified: Secondary | ICD-10-CM

## 2018-02-22 DIAGNOSIS — I4581 Long QT syndrome: Secondary | ICD-10-CM

## 2018-02-22 DIAGNOSIS — I428 Other cardiomyopathies: Secondary | ICD-10-CM

## 2018-02-22 DIAGNOSIS — I2510 Atherosclerotic heart disease of native coronary artery without angina pectoris: Secondary | ICD-10-CM

## 2018-02-22 HISTORY — PX: RIGHT/LEFT HEART CATH AND CORONARY ANGIOGRAPHY: CATH118266

## 2018-02-22 LAB — BASIC METABOLIC PANEL
Anion gap: 9 (ref 5–15)
BUN: 23 mg/dL (ref 8–23)
CO2: 26 mmol/L (ref 22–32)
Calcium: 8.8 mg/dL — ABNORMAL LOW (ref 8.9–10.3)
Chloride: 104 mmol/L (ref 98–111)
Creatinine, Ser: 0.91 mg/dL (ref 0.44–1.00)
GFR calc Af Amer: >60 mL/min (ref >60)
GFR calc non Af Amer: >60 mL/min (ref >60)
Glucose, Bld: 124 mg/dL — ABNORMAL HIGH (ref 70–99)
Potassium: 4.5 mmol/L (ref 3.5–5.1)
Sodium: 139 mmol/L (ref 135–145)

## 2018-02-22 LAB — POCT I-STAT EG7
Acid-Base Excess: 1 mmol/L (ref 0.0–2.0)
Acid-Base Excess: 1 mmol/L (ref 0.0–2.0)
Bicarbonate: 28.9 mmol/L — ABNORMAL HIGH (ref 20.0–28.0)
Bicarbonate: 29.1 mmol/L — ABNORMAL HIGH (ref 20.0–28.0)
Calcium, Ion: 1.22 mmol/L (ref 1.15–1.40)
Calcium, Ion: 1.26 mmol/L (ref 1.15–1.40)
HCT: 37 % (ref 36.0–46.0)
HCT: 38 % (ref 36.0–46.0)
Hemoglobin: 12.6 g/dL (ref 12.0–15.0)
Hemoglobin: 12.9 g/dL (ref 12.0–15.0)
O2 Saturation: 55 %
O2 Saturation: 56 %
Potassium: 4.2 mmol/L (ref 3.5–5.1)
Potassium: 4.3 mmol/L (ref 3.5–5.1)
Sodium: 138 mmol/L (ref 135–145)
Sodium: 138 mmol/L (ref 135–145)
TCO2: 31 mmol/L (ref 22–32)
TCO2: 31 mmol/L (ref 22–32)
pCO2, Ven: 58.3 mmHg (ref 44.0–60.0)
pCO2, Ven: 60.2 mmHg — ABNORMAL HIGH (ref 44.0–60.0)
pH, Ven: 7.293 (ref 7.250–7.430)
pH, Ven: 7.303 (ref 7.250–7.430)
pO2, Ven: 33 mmHg (ref 32.0–45.0)
pO2, Ven: 34 mmHg (ref 32.0–45.0)

## 2018-02-22 LAB — CBC
HCT: 39.2 % (ref 36.0–46.0)
Hemoglobin: 12.7 g/dL (ref 12.0–15.0)
MCH: 29.7 pg (ref 26.0–34.0)
MCHC: 32.4 g/dL (ref 30.0–36.0)
MCV: 91.8 fL (ref 80.0–100.0)
Platelets: 293 10*3/uL (ref 150–400)
RBC: 4.27 MIL/uL (ref 3.87–5.11)
RDW: 14 % (ref 11.5–15.5)
WBC: 7.3 10*3/uL (ref 4.0–10.5)
nRBC: 0 % (ref 0.0–0.2)

## 2018-02-22 LAB — PROTIME-INR
INR: 1.12
Prothrombin Time: 14.3 seconds (ref 11.4–15.2)

## 2018-02-22 LAB — D-DIMER, QUANTITATIVE: D-Dimer, Quant: 0.32 ug/mL-FEU (ref 0.00–0.50)

## 2018-02-22 SURGERY — RIGHT/LEFT HEART CATH AND CORONARY ANGIOGRAPHY
Anesthesia: LOCAL

## 2018-02-22 MED ORDER — AMIODARONE LOAD VIA INFUSION
150.0000 mg | Freq: Once | INTRAVENOUS | Status: AC
  Administered 2018-02-22: 150 mg via INTRAVENOUS
  Filled 2018-02-22: qty 83.34

## 2018-02-22 MED ORDER — HEPARIN (PORCINE) IN NACL 1000-0.9 UT/500ML-% IV SOLN
INTRAVENOUS | Status: AC
  Filled 2018-02-22: qty 1000

## 2018-02-22 MED ORDER — SODIUM CHLORIDE 0.9 % IV SOLN: 250.0000 mL | INTRAVENOUS | Status: DC | PRN

## 2018-02-22 MED ORDER — SODIUM CHLORIDE 0.9% FLUSH
3.0000 mL | Freq: Two times a day (BID) | INTRAVENOUS | Status: DC
  Administered 2018-02-22 – 2018-02-25 (×4): 3 mL via INTRAVENOUS

## 2018-02-22 MED ORDER — METOPROLOL SUCCINATE ER 100 MG PO TB24
100.0000 mg | ORAL_TABLET | Freq: Every day | ORAL | Status: DC
  Administered 2018-02-22 – 2018-02-24 (×3): 100 mg via ORAL
  Filled 2018-02-22 (×4): qty 1

## 2018-02-22 MED ORDER — AMIODARONE HCL IN DEXTROSE 360-4.14 MG/200ML-% IV SOLN
INTRAVENOUS | Status: AC
  Filled 2018-02-22: qty 200

## 2018-02-22 MED ORDER — FENTANYL CITRATE (PF) 100 MCG/2ML IJ SOLN
INTRAMUSCULAR | Status: DC | PRN
  Administered 2018-02-22 (×2): 25 ug via INTRAVENOUS

## 2018-02-22 MED ORDER — IOHEXOL 350 MG/ML SOLN
INTRAVENOUS | Status: DC | PRN
  Administered 2018-02-22: 30 mL via INTRA_ARTERIAL

## 2018-02-22 MED ORDER — HEPARIN SODIUM (PORCINE) 1000 UNIT/ML IJ SOLN
INTRAMUSCULAR | Status: DC | PRN
  Administered 2018-02-22: 3500 [IU] via INTRAVENOUS

## 2018-02-22 MED ORDER — HEPARIN (PORCINE) IN NACL 1000-0.9 UT/500ML-% IV SOLN
INTRAVENOUS | Status: DC | PRN
  Administered 2018-02-22 (×2): 500 mL

## 2018-02-22 MED ORDER — ONDANSETRON HCL 4 MG/2ML IJ SOLN: 4.0000 mg | Freq: Four times a day (QID) | INTRAMUSCULAR | Status: DC | PRN

## 2018-02-22 MED ORDER — SODIUM CHLORIDE 0.9% FLUSH
3.0000 mL | INTRAVENOUS | Status: DC | PRN
  Administered 2018-02-24: 3 mL via INTRAVENOUS
  Filled 2018-02-22: qty 3

## 2018-02-22 MED ORDER — AMIODARONE HCL IN DEXTROSE 360-4.14 MG/200ML-% IV SOLN
60.0000 mg/h | INTRAVENOUS | Status: AC
  Administered 2018-02-22: 60 mg/h via INTRAVENOUS
  Filled 2018-02-22: qty 200

## 2018-02-22 MED ORDER — POTASSIUM CHLORIDE CRYS ER 20 MEQ PO TBCR
20.0000 meq | EXTENDED_RELEASE_TABLET | Freq: Once | ORAL | Status: AC
  Administered 2018-02-22: 20 meq via ORAL
  Filled 2018-02-22: qty 1

## 2018-02-22 MED ORDER — FUROSEMIDE 10 MG/ML IJ SOLN
40.0000 mg | Freq: Two times a day (BID) | INTRAMUSCULAR | Status: DC
  Administered 2018-02-22 – 2018-02-24 (×4): 40 mg via INTRAVENOUS
  Filled 2018-02-22 (×4): qty 4

## 2018-02-22 MED ORDER — FENTANYL CITRATE (PF) 100 MCG/2ML IJ SOLN
INTRAMUSCULAR | Status: AC
  Filled 2018-02-22: qty 2

## 2018-02-22 MED ORDER — LIDOCAINE HCL (PF) 1 % IJ SOLN
INTRAMUSCULAR | Status: AC
  Filled 2018-02-22: qty 30

## 2018-02-22 MED ORDER — MIDAZOLAM HCL 2 MG/2ML IJ SOLN
INTRAMUSCULAR | Status: DC | PRN
  Administered 2018-02-22: 1 mg via INTRAVENOUS

## 2018-02-22 MED ORDER — VERAPAMIL HCL 2.5 MG/ML IV SOLN
INTRAVENOUS | Status: DC | PRN
  Administered 2018-02-22: 10 mL via INTRA_ARTERIAL

## 2018-02-22 MED ORDER — VERAPAMIL HCL 2.5 MG/ML IV SOLN
INTRAVENOUS | Status: AC
  Filled 2018-02-22: qty 2

## 2018-02-22 MED ORDER — AMIODARONE HCL IN DEXTROSE 360-4.14 MG/200ML-% IV SOLN
30.0000 mg/h | INTRAVENOUS | Status: DC
  Administered 2018-02-22 – 2018-02-24 (×5): 30 mg/h via INTRAVENOUS
  Filled 2018-02-22 (×4): qty 200

## 2018-02-22 MED ORDER — HEPARIN SODIUM (PORCINE) 1000 UNIT/ML IJ SOLN
INTRAMUSCULAR | Status: AC
  Filled 2018-02-22: qty 1

## 2018-02-22 MED ORDER — MIDAZOLAM HCL 2 MG/2ML IJ SOLN
INTRAMUSCULAR | Status: AC
  Filled 2018-02-22: qty 2

## 2018-02-22 MED ORDER — ACETAMINOPHEN 325 MG PO TABS: 650.0000 mg | ORAL_TABLET | ORAL | Status: DC | PRN

## 2018-02-22 MED ORDER — LIDOCAINE HCL (PF) 1 % IJ SOLN
INTRAMUSCULAR | Status: DC | PRN
  Administered 2018-02-22 (×2): 2 mL

## 2018-02-22 MED ORDER — HEPARIN (PORCINE) 25000 UT/250ML-% IV SOLN
700.0000 [IU]/h | INTRAVENOUS | Status: DC
  Administered 2018-02-22 – 2018-02-24 (×3): 750 [IU]/h via INTRAVENOUS
  Filled 2018-02-22 (×2): qty 250

## 2018-02-22 MED ORDER — WARFARIN SODIUM 5 MG PO TABS
5.0000 mg | ORAL_TABLET | Freq: Once | ORAL | Status: AC
  Administered 2018-02-22: 5 mg via ORAL
  Filled 2018-02-22: qty 1

## 2018-02-22 SURGICAL SUPPLY — 11 items

## 2018-02-22 NOTE — H&P (View-Only) (Signed)
Progress Note  Patient Name: Maureen [name] Date of Encounter: 02/22/2018  Primary Cardiologist: Glenetta Hew, MD   Subjective   Denies chest pain. Continues to have exertional SOB. Plan for cardiac catheterization today.   Inpatient Medications    Scheduled Meds: . [START ON 02/28/2018] amiodarone  200 mg Oral BID  . amiodarone  400 mg Oral BID  . carbamazepine  100 mg Oral BID   Followed by  . [START ON 02/26/2018] carbamazepine  100 mg Oral Daily  . diltiazem  360 mg Oral Daily  . enoxaparin (LOVENOX) injection  60 mg Subcutaneous Q12H  . furosemide  20 mg Oral Daily  . levETIRAcetam  500 mg Oral BID  . metoprolol tartrate  100 mg Oral BID  . pravastatin  40 mg Oral Daily  . sodium chloride flush  3 mL Intravenous Q12H  . Warfarin - Pharmacist Dosing Inpatient   Does not apply q1800   Continuous Infusions: . sodium chloride    . sodium chloride 10 mL/hr at 02/22/18 4098   PRN Meds: sodium chloride, acetaminophen, albuterol, metoprolol tartrate, ondansetron (ZOFRAN) IV, sodium chloride flush   Vital Signs    Vitals:   02/21/18 2100 02/22/18 0246 02/22/18 0623 02/22/18 0905  BP: (!) 120/93  110/81 (!) 127/99  Pulse: (!) 115  (!) 104 (!) 105  Resp: 18     Temp: 97.9 F (36.6 C)  (!) 97.3 F (36.3 C)   TempSrc: Oral  Oral   SpO2: 97%  96%   Weight:  69.1 kg    Height:        Intake/Output Summary (Last 24 hours) at 02/22/2018 1111 Last data filed at 02/22/2018 0245 Gross per 24 hour  Intake 1080 ml  Output 600 ml  Net 480 ml   Filed Weights   02/20/18 0422 02/21/18 0500 02/22/18 0246  Weight: 69.5 kg 69.5 kg 69.1 kg   Physical Exam   General: Well developed, well nourished, NAD Skin: Warm, dry, intact  Head: Normocephalic, atraumatic, clear, moist mucus membranes. Neck: Negative for carotid bruits. No JVD Lungs:Clear to ausculation bilaterally. No wheezes, rales, or rhonchi. Breathing is unlabored. Cardiovascular: Irregularly irregular with S1 S2. No  murmurs, rubs, gallops, or LV heave appreciated. Abdomen: Soft, non-tender, non-distended with normoactive bowel sounds. No obvious abdominal masses. Extremities: No edema. No clubbing or cyanosis. DP/PT pulses 2+ bilaterally Neuro: Alert and oriented. No focal deficits. No facial asymmetry. MAE spontaneously. Psych: Responds to questions appropriately with normal affect.    Labs    Chemistry Recent Labs  Lab 02/17/18 1345 02/18/18 0604 02/22/18 0548  NA 135 138 139  K 4.2 3.8 4.5  CL 98 102 104  CO2 24 28 26   GLUCOSE 111* 119* 124*  BUN 16 15 23   CREATININE 0.69 0.84 0.91  CALCIUM 9.4 8.7* 8.8*  GFRNONAA >60 >60 >60  GFRAA >60 >60 >60  ANIONGAP 13 8 9      Hematology Recent Labs  Lab 02/20/18 0553 02/21/18 0534 02/22/18 0548  WBC 8.1 7.0 7.3  RBC 3.97 4.32 4.27  HGB 11.7* 12.4 12.7  HCT 35.8* 39.2 39.2  MCV 90.2 90.7 91.8  MCH 29.5 28.7 29.7  MCHC 32.7 31.6 32.4  RDW 13.8 13.8 14.0  PLT 282 306 293    Cardiac Enzymes Recent Labs  Lab 02/17/18 1833 02/18/18 0027 02/18/18 0604  TROPONINI <0.03 <0.03 <0.03    Recent Labs  Lab 02/17/18 1353  TROPIPOC 0.02     BNP  Recent Labs  Lab 02/17/18 1833  BNP 232.7*     DDimer No results for input(s): DDIMER in the last 168 hours.   Radiology    No results found.  Telemetry    02/22/2018 AF with HR 110-120's  - Personally Reviewed  ECG    No new tracing as of 02/22/2018 - Personally Reviewed  Cardiac Studies   Echo 02/19/18: IMPRESSIONS  1. The left ventricle has moderately reduced systolic function of 22-02%. The cavity size is normal. There is no left ventricular wall thickness. Echo evidence of Unable to assess diastolic filling patterns. 2. The right ventricle is in size. There is severely reduced systolic function. 3. Severely dilated left atrial size. 4. Mildly dilated right atrial size. 5. The mitral valve normal in structure. Regurgitation is mild to moderate by color flow  Doppler. 6. Normal tricuspid valve. 7. Tricuspid regurgitation is mild. 8. The aortic valve tricuspid. Aortic valve regurgitation is mild by color flow Doppler. 9. Pulmonic valve regurgitation is mild by color flow Doppler. 10. No atrial level shunt detected by color flow Doppler.  Patient Profile     78 y.o. female with hypertension, GERD and benign meningeal tumor and seizures here with new onset atrial flutter found to have reduced LV and RV function.   Assessment & Plan    1.  New onset atrial flutter: -Currently on diltiazem CD 360 mg and metoprolol tartrate 100 mg twice daily for rate control -Will change diltiazem PO to Amiodarone gtt for load and then transition to PO given sustained HR and reduced LVEF  -Will change metoprolol to Toprol XL  -HR today 110-120's  -Plan for TEE/DCCV this admission after cath. Left atrium is severely enlarged.   -This patients CHA2DS2-VASc Score is 5 (CHF, HTN, Age (2), female).  She is currently on subcu Lovenox for anticoagulation.  Since patient is on Tegretol we are avoiding DOAC. The patient has been started on Keppra and neurology recommends weaning off Tegretol over 12 days. Plan for bridge with warfarin during weaning process and can start [place] in a couple of weeks once Tegretol is fully weaned off.  Planning to start warfarin post cath. -Plan for R/L heart cath today given reduced LV and RV function    2.  Acute systolic and diastolic heart failure: -LVEF 35-40% by echo 02/19/2018>>>could be tachycardia mediated however with EKG changes, plan is now for cath today for further ischemic evaluation  -With severely reduced RV per echo>>>plan for right heart as well  -On Lasix 20 mg p.o. daily -Weight, 152lb today, 155lb on presentation  -I&O, net negative 112ml since admission   3. Hypertension: -Stable, 127/99>110/81>120/93 -Will change metoprolol to carvedilol   4. Hyperlipidemia: -On Pravastatin 40 mg daily   Signed, Kathyrn Drown NP-C [facility] Pager: [phone] 02/22/2018, 11:11 AM     Patient seen and examined. Agree with assessment and plan. No significant CP now.  With recent increase sob, and severely reduced RV  recommend D dimer and if elevated consider evaluation for PE. For R and Left heart cath today. LVEF 35 - 40% by echo. Mildly increased JVD with HJR on exam. A flutter on ECG with inferior and anterolateral T wave abnormality and QTc increased at 513.  Repeat ECG today. Currently being transitioned from tegretol to keppra. Warfarin was started but not yet therapeutic; she had received lovenox, but on hold for cath today. Last dose was 7am; wait 6 hrs prior to cath.  To start iv amiodarone for improved  rate control, she had only received 2 doses of oral therapy. I have reviewed the risks, indications, and alternatives to cardiac catheterization, possible angioplasty, and stenting with the patient. Risks include but are not limited to bleeding, infection, vascular injury, stroke, myocardial infection, arrhythmia, kidney injury, radiation-related injury in the case of prolonged fluoroscopy use, emergency cardiac surgery, and death. The patient understands the risks of serious complication is 1-2 in 6619 with diagnostic cardiac cath and 1-2% or less with angioplasty/stenting.    Troy Sine, MD, [facility] 02/22/2018 12:02 PM    For questions or updates, please contact   Please consult [email or web address] for contact info under Cardiology/STEMI.

## 2018-02-22 NOTE — Progress Notes (Addendum)
Progress Note  Patient Name: Maureen [name] Date of Encounter: 02/22/2018  Primary Cardiologist: Glenetta Hew, MD   Subjective   Denies chest pain. Continues to have exertional SOB. Plan for cardiac catheterization today.   Inpatient Medications    Scheduled Meds: . [START ON 02/28/2018] amiodarone  200 mg Oral BID  . amiodarone  400 mg Oral BID  . carbamazepine  100 mg Oral BID   Followed by  . [START ON 02/26/2018] carbamazepine  100 mg Oral Daily  . diltiazem  360 mg Oral Daily  . enoxaparin (LOVENOX) injection  60 mg Subcutaneous Q12H  . furosemide  20 mg Oral Daily  . levETIRAcetam  500 mg Oral BID  . metoprolol tartrate  100 mg Oral BID  . pravastatin  40 mg Oral Daily  . sodium chloride flush  3 mL Intravenous Q12H  . Warfarin - Pharmacist Dosing Inpatient   Does not apply q1800   Continuous Infusions: . sodium chloride    . sodium chloride 10 mL/hr at 02/22/18 5852   PRN Meds: sodium chloride, acetaminophen, albuterol, metoprolol tartrate, ondansetron (ZOFRAN) IV, sodium chloride flush   Vital Signs    Vitals:   02/21/18 2100 02/22/18 0246 02/22/18 0623 02/22/18 0905  BP: (!) 120/93  110/81 (!) 127/99  Pulse: (!) 115  (!) 104 (!) 105  Resp: 18     Temp: 97.9 F (36.6 C)  (!) 97.3 F (36.3 C)   TempSrc: Oral  Oral   SpO2: 97%  96%   Weight:  69.1 kg    Height:        Intake/Output Summary (Last 24 hours) at 02/22/2018 1111 Last data filed at 02/22/2018 0245 Gross per 24 hour  Intake 1080 ml  Output 600 ml  Net 480 ml   Filed Weights   02/20/18 0422 02/21/18 0500 02/22/18 0246  Weight: 69.5 kg 69.5 kg 69.1 kg   Physical Exam   General: Well developed, well nourished, NAD Skin: Warm, dry, intact  Head: Normocephalic, atraumatic, clear, moist mucus membranes. Neck: Negative for carotid bruits. No JVD Lungs:Clear to ausculation bilaterally. No wheezes, rales, or rhonchi. Breathing is unlabored. Cardiovascular: Irregularly irregular with S1 S2. No  murmurs, rubs, gallops, or LV heave appreciated. Abdomen: Soft, non-tender, non-distended with normoactive bowel sounds. No obvious abdominal masses. Extremities: No edema. No clubbing or cyanosis. DP/PT pulses 2+ bilaterally Neuro: Alert and oriented. No focal deficits. No facial asymmetry. MAE spontaneously. Psych: Responds to questions appropriately with normal affect.    Labs    Chemistry Recent Labs  Lab 02/17/18 1345 02/18/18 0604 02/22/18 0548  NA 135 138 139  K 4.2 3.8 4.5  CL 98 102 104  CO2 24 28 26   GLUCOSE 111* 119* 124*  BUN 16 15 23   CREATININE 0.69 0.84 0.91  CALCIUM 9.4 8.7* 8.8*  GFRNONAA >60 >60 >60  GFRAA >60 >60 >60  ANIONGAP 13 8 9      Hematology Recent Labs  Lab 02/20/18 0553 02/21/18 0534 02/22/18 0548  WBC 8.1 7.0 7.3  RBC 3.97 4.32 4.27  HGB 11.7* 12.4 12.7  HCT 35.8* 39.2 39.2  MCV 90.2 90.7 91.8  MCH 29.5 28.7 29.7  MCHC 32.7 31.6 32.4  RDW 13.8 13.8 14.0  PLT 282 306 293    Cardiac Enzymes Recent Labs  Lab 02/17/18 1833 02/18/18 0027 02/18/18 0604  TROPONINI <0.03 <0.03 <0.03    Recent Labs  Lab 02/17/18 1353  TROPIPOC 0.02     BNP  Recent Labs  Lab 02/17/18 1833  BNP 232.7*     DDimer No results for input(s): DDIMER in the last 168 hours.   Radiology    No results found.  Telemetry    02/22/2018 AF with HR 110-120's  - Personally Reviewed  ECG    No new tracing as of 02/22/2018 - Personally Reviewed  Cardiac Studies   Echo 02/19/18: IMPRESSIONS  1. The left ventricle has moderately reduced systolic function of 69-48%. The cavity size is normal. There is no left ventricular wall thickness. Echo evidence of Unable to assess diastolic filling patterns. 2. The right ventricle is in size. There is severely reduced systolic function. 3. Severely dilated left atrial size. 4. Mildly dilated right atrial size. 5. The mitral valve normal in structure. Regurgitation is mild to moderate by color flow  Doppler. 6. Normal tricuspid valve. 7. Tricuspid regurgitation is mild. 8. The aortic valve tricuspid. Aortic valve regurgitation is mild by color flow Doppler. 9. Pulmonic valve regurgitation is mild by color flow Doppler. 10. No atrial level shunt detected by color flow Doppler.  Patient Profile     78 y.o. female with hypertension, GERD and benign meningeal tumor and seizures here with new onset atrial flutter found to have reduced LV and RV function.   Assessment & Plan    1.  New onset atrial flutter: -Currently on diltiazem CD 360 mg and metoprolol tartrate 100 mg twice daily for rate control -Will change diltiazem PO to Amiodarone gtt for load and then transition to PO given sustained HR and reduced LVEF  -Will change metoprolol to Toprol XL  -HR today 110-120's  -Plan for TEE/DCCV this admission after cath. Left atrium is severely enlarged.   -This patients CHA2DS2-VASc Score is 5 (CHF, HTN, Age (2), female).  She is currently on subcu Lovenox for anticoagulation.  Since patient is on Tegretol we are avoiding DOAC. The patient has been started on Keppra and neurology recommends weaning off Tegretol over 12 days. Plan for bridge with warfarin during weaning process and can start [place] in a couple of weeks once Tegretol is fully weaned off.  Planning to start warfarin post cath. -Plan for R/L heart cath today given reduced LV and RV function    2.  Acute systolic and diastolic heart failure: -LVEF 35-40% by echo 02/19/2018>>>could be tachycardia mediated however with EKG changes, plan is now for cath today for further ischemic evaluation  -With severely reduced RV per echo>>>plan for right heart as well  -On Lasix 20 mg p.o. daily -Weight, 152lb today, 155lb on presentation  -I&O, net negative 115ml since admission   3. Hypertension: -Stable, 127/99>110/81>120/93 -Will change metoprolol to carvedilol   4. Hyperlipidemia: -On Pravastatin 40 mg daily   Signed, Kathyrn Drown NP-C [facility] Pager: [phone] 02/22/2018, 11:11 AM     Patient seen and examined. Agree with assessment and plan. No significant CP now.  With recent increase sob, and severely reduced RV  recommend D dimer and if elevated consider evaluation for PE. For R and Left heart cath today. LVEF 35 - 40% by echo. Mildly increased JVD with HJR on exam. A flutter on ECG with inferior and anterolateral T wave abnormality and QTc increased at 513.  Repeat ECG today. Currently being transitioned from tegretol to keppra. Warfarin was started but not yet therapeutic; she had received lovenox, but on hold for cath today. Last dose was 7am; wait 6 hrs prior to cath.  To start iv amiodarone for improved  rate control, she had only received 2 doses of oral therapy. I have reviewed the risks, indications, and alternatives to cardiac catheterization, possible angioplasty, and stenting with the patient. Risks include but are not limited to bleeding, infection, vascular injury, stroke, myocardial infection, arrhythmia, kidney injury, radiation-related injury in the case of prolonged fluoroscopy use, emergency cardiac surgery, and death. The patient understands the risks of serious complication is 1-2 in 7841 with diagnostic cardiac cath and 1-2% or less with angioplasty/stenting.    Troy Sine, MD, [facility] 02/22/2018 12:02 PM    For questions or updates, please contact   Please consult [email or web address] for contact info under Cardiology/STEMI.

## 2018-02-22 NOTE — Progress Notes (Signed)
Removed 3cc air from TR band.Patient started to bleed, 3cc air was placed back. Still has 12cc air at this time. Will monitor

## 2018-02-22 NOTE — Progress Notes (Signed)
ANTICOAGULATION CONSULT NOTE - Follow-Up Consult  Pharmacy Consult for Heparin bridge (post-cath) + Warfarin Indication: atrial fibrillation  Allergies  Allergen Reactions  . Dilantin [Phenytoin Sodium Extended]      Stupor, ataxia.   Marland Kitchen Hydrocodone Nausea And Vomiting    Patient Measurements: Height: 5\' 1"  (154.9 cm) Weight: 152 lb 4.8 oz (69.1 kg) IBW/kg (Calculated) : 47.8 Heparin Dosing Weight: 62.9 kg  Vital Signs: Temp: 97.3 F (36.3 C) (02/03 0623) Temp Source: Oral (02/03 0623) BP: 117/85 (02/03 1435) Pulse Rate: 93 (02/03 1435)  Labs: Recent Labs    02/20/18 0553 02/21/18 0534 02/22/18 0548  HGB 11.7* 12.4 12.7  HCT 35.8* 39.2 39.2  PLT 282 306 293  LABPROT 14.7 14.1 14.3  INR 1.16 1.10 1.12  CREATININE  --   --  0.91    Estimated Creatinine Clearance: 44.6 mL/min (by C-G formula based on SCr of 0.91 mg/dL).   Medical History: Past Medical History:  Diagnosis Date  . Benign tumor of meninges ([place])   . GERD (gastroesophageal reflux disease)    occ. in past  . Headache(784.0)   . HTN (hypertension), benign    pt. states no hypertension  . Pneumonia    ? as child  . PONV (postoperative nausea and vomiting)   . Seizures ([place])    positive for HBP    Medications:  Scheduled:  . [MAR Hold] carbamazepine  100 mg Oral BID   Followed by  . [MAR Hold] carbamazepine  100 mg Oral Daily  . furosemide  40 mg Intravenous BID  . [MAR Hold] levETIRAcetam  500 mg Oral BID  . [MAR Hold] metoprolol succinate  100 mg Oral Daily  . potassium chloride  20 mEq Oral Once  . [MAR Hold] pravastatin  40 mg Oral Daily  . sodium chloride flush  3 mL Intravenous Q12H  . [MAR Hold] Warfarin - Pharmacist Dosing Inpatient   Does not apply q1800   Infusions:  . sodium chloride    . sodium chloride 10 mL/hr at 02/22/18 0623  . amiodarone 60 mg/hr (02/22/18 1229)   Followed by  . amiodarone      Assessment: 78yo female with new onset afib. Patientswitched from  heparin infusion to warfarin with lovenox bridge 02/19/18 PM for new onset atrial fibrillation. Patient will need enoxaparin (Lovenox) 60 mg syringes #10 at discharge and education for lovenox injections.  Of note, patient is currently being tapered off of carbamazepine. INR will need to be closely followed at discharge as carbamazepine induces the metabolism of warfarin, thus causing a decreased INR. Would expect as carbamazepine is tapered off that patient's INR will increase. Additionally, amiodarone has been started on patient beginning 02/21/18, which can also increase INR of warfarin.    Patient received first dose of warfarin on 1/31. Dose held 2/1 per MD to ensure patient wasn't supratherapeutic for planned cath on 2/3. The patient is s/p cath 2/3 and plans are to bridge with Heparin starting 8 hours post-sheath removal and resume warfarin dosing. Plans for transitioning back to Lovenox should be followed up on 2/4.   Noted sheath removal documented around 1500 today.  Goal of Therapy:  INR 2-3   Plan:  - Start Heparin at 750 unit/hr (7.5 ml/hr) starting at 2300 today - Warfarin 5 mg x 1 dose at 1800 today - Will follow-up on plans to transition back to a lovenox bridge on 2/4 - Will continue to monitor for any signs/symptoms of bleeding and will follow up  with heparin level in 8 hours after restarting Heparin  Thank you for allowing pharmacy to be a part of this patient's care.  Alycia Rossetti, PharmD, BCPS Clinical Pharmacist Clinical phone for 02/22/2018: [phone] 02/22/2018 2:56 PM   **Pharmacist phone directory can now be found on [email or web address] (PW [email or web address]).  Listed under [place].

## 2018-02-22 NOTE — Care Management Important Message (Signed)
Important Message  Patient Details  Name: Maureen [name] MRN: [id number] Date of Birth: 06/16/1940   Medicare Important Message Given:  Yes    Jahsiah Carpenter P [place] 02/22/2018, 4:10 PM

## 2018-02-22 NOTE — Interval H&P Note (Signed)
History and Physical Interval Note:  02/22/2018 1:59 PM  Maureen [name]  has presented today for surgery, with the diagnosis of unstable angina  The various methods of treatment have been discussed with the patient and family. After consideration of risks, benefits and other options for treatment, the patient has consented to  Procedure(s): RIGHT/LEFT HEART CATH AND CORONARY ANGIOGRAPHY (N/A) as a surgical intervention .  The patient's history has been reviewed, patient examined, no change in status, stable for surgery.  I have reviewed the patient's chart and labs.  Questions were answered to the patient's satisfaction.     Natale Thoma [employer]

## 2018-02-22 NOTE — Progress Notes (Signed)
2nd attempt to take 3cc of air out from TR band but patient still bleeds. Called Cath lab and spoke to [place].He  Stated that we just continue to do what we are doing and attempt to take the air out after 37mins. Will monitor.

## 2018-02-23 ENCOUNTER — Encounter (HOSPITAL_COMMUNITY): Payer: Self-pay | Admitting: Cardiology

## 2018-02-23 ENCOUNTER — Encounter (HOSPITAL_COMMUNITY)
Admission: EM | Disposition: A | Discharge status: Home / Self Care | Payer: Self-pay | Source: Ambulatory Visit | Attending: Cardiology

## 2018-02-23 LAB — CBC
HCT: 38.6 % (ref 36.0–46.0)
Hemoglobin: 12.2 g/dL (ref 12.0–15.0)
MCH: 28.5 pg (ref 26.0–34.0)
MCHC: 31.6 g/dL (ref 30.0–36.0)
MCV: 90.2 fL (ref 80.0–100.0)
Platelets: 268 10*3/uL (ref 150–400)
RBC: 4.28 MIL/uL (ref 3.87–5.11)
RDW: 13.8 % (ref 11.5–15.5)
WBC: 8.9 10*3/uL (ref 4.0–10.5)
nRBC: 0 % (ref 0.0–0.2)

## 2018-02-23 LAB — BASIC METABOLIC PANEL
Anion gap: 10 (ref 5–15)
BUN: 25 mg/dL — ABNORMAL HIGH (ref 8–23)
CO2: 26 mmol/L (ref 22–32)
Calcium: 8.9 mg/dL (ref 8.9–10.3)
Chloride: 102 mmol/L (ref 98–111)
Creatinine, Ser: 0.85 mg/dL (ref 0.44–1.00)
GFR calc Af Amer: >60 mL/min (ref >60)
GFR calc non Af Amer: >60 mL/min (ref >60)
Glucose, Bld: 118 mg/dL — ABNORMAL HIGH (ref 70–99)
Potassium: 4 mmol/L (ref 3.5–5.1)
Sodium: 138 mmol/L (ref 135–145)

## 2018-02-23 LAB — PROTIME-INR
INR: 1.26
Prothrombin Time: 15.7 seconds — ABNORMAL HIGH (ref 11.4–15.2)

## 2018-02-23 LAB — MAGNESIUM: Magnesium: 2 mg/dL (ref 1.7–2.4)

## 2018-02-23 LAB — HEPARIN LEVEL (UNFRACTIONATED): Heparin Unfractionated: 0.51 IU/mL (ref 0.30–0.70)

## 2018-02-23 SURGERY — CANCELLED PROCEDURE

## 2018-02-23 MED ORDER — WARFARIN SODIUM 7.5 MG PO TABS
7.5000 mg | ORAL_TABLET | Freq: Once | ORAL | Status: AC
  Administered 2018-02-23: 7.5 mg via ORAL
  Filled 2018-02-23: qty 1

## 2018-02-23 MED ORDER — WARFARIN SODIUM 5 MG PO TABS: 5.0000 mg | ORAL_TABLET | Freq: Once | ORAL | Status: DC

## 2018-02-23 MED ORDER — SODIUM CHLORIDE 0.9% FLUSH
10.0000 mL | Freq: Two times a day (BID) | INTRAVENOUS | Status: DC
  Administered 2018-02-24 (×2): 10 mL

## 2018-02-23 MED ORDER — SODIUM CHLORIDE 0.9% FLUSH: 10.0000 mL | INTRAVENOUS | Status: DC | PRN

## 2018-02-23 NOTE — Progress Notes (Addendum)
Progress Note  Patient Name: Maureen [name] Date of Encounter: 02/23/2018  Primary Cardiologist: Glenetta Hew, MD   Subjective   EKG showed atypical flutter (personally reviewed with Dr. Rayann Heman). Apparently patient ate this morning, will schedule TEE/DCCT tomorrow. NPO after midnight   Inpatient Medications    Scheduled Meds: . carbamazepine  100 mg Oral BID   Followed by  . [START ON 02/26/2018] carbamazepine  100 mg Oral Daily  . furosemide  40 mg Intravenous BID  . levETIRAcetam  500 mg Oral BID  . metoprolol succinate  100 mg Oral Daily  . pravastatin  40 mg Oral Daily  . sodium chloride flush  3 mL Intravenous Q12H  . Warfarin - Pharmacist Dosing Inpatient   Does not apply q1800   Continuous Infusions: . sodium chloride    . amiodarone 30 mg/hr (02/23/18 0316)  . heparin 750 Units/hr (02/23/18 0316)   PRN Meds: sodium chloride, acetaminophen, acetaminophen, albuterol, metoprolol tartrate, ondansetron (ZOFRAN) IV, sodium chloride flush   Vital Signs    Vitals:   02/22/18 1927 02/22/18 2358 02/23/18 0423 02/23/18 0916  BP: 111/78 114/72 120/73 126/86  Pulse: 90 89 89 88  Resp: 18 14 20    Temp: 98 F (36.7 C) 97.9 F (36.6 C) 98 F (36.7 C)   TempSrc: Oral Oral Oral   SpO2: 98% 96% 96%   Weight:   69.5 kg   Height:        Intake/Output Summary (Last 24 hours) at 02/23/2018 0954 Last data filed at 02/23/2018 0316 Gross per 24 hour  Intake 519.36 ml  Output 250 ml  Net 269.36 ml   Last 3 Weights 02/23/2018 02/22/2018 02/21/2018  Weight (lbs) 153 lb 4.8 oz 152 lb 4.8 oz 153 lb 3.2 oz  Weight (kg) 69.536 kg 69.083 kg 69.491 kg      Telemetry    Aflutter at controlled rate - Personally Reviewed  ECG    Aflutter at rate of 89 bpm - Personally Reviewed  Physical Exam   GEN: No acute distress.   Neck: No JVD Cardiac: RRR, no murmurs, rubs, or gallops.  Respiratory: Clear to auscultation bilaterally. GI: Soft, nontender, non-distended  MS: No edema; No  deformity. Neuro:  Nonfocal  Psych: Normal affect   Labs    Chemistry Recent Labs  Lab 02/18/18 0604 02/22/18 0548 02/22/18 1415 02/22/18 1416 02/23/18 0501  NA 138 139 138 138 138  K 3.8 4.5 4.2 4.3 4.0  CL 102 104  --   --  102  CO2 28 26  --   --  26  GLUCOSE 119* 124*  --   --  118*  BUN 15 23  --   --  25*  CREATININE 0.84 0.91  --   --  0.85  CALCIUM 8.7* 8.8*  --   --  8.9  GFRNONAA >60 >60  --   --  >60  GFRAA >60 >60  --   --  >60  ANIONGAP 8 9  --   --  10     Hematology Recent Labs  Lab 02/21/18 0534 02/22/18 0548 02/22/18 1415 02/22/18 1416 02/23/18 0501  WBC 7.0 7.3  --   --  8.9  RBC 4.32 4.27  --   --  4.28  HGB 12.4 12.7 12.6 12.9 12.2  HCT 39.2 39.2 37.0 38.0 38.6  MCV 90.7 91.8  --   --  90.2  MCH 28.7 29.7  --   --  28.5  MCHC 31.6 32.4  --   --  31.6  RDW 13.8 14.0  --   --  13.8  PLT 306 293  --   --  268    Cardiac Enzymes Recent Labs  Lab 02/17/18 1833 02/18/18 0027 02/18/18 0604  TROPONINI <0.03 <0.03 <0.03    Recent Labs  Lab 02/17/18 1353  TROPIPOC 0.02     BNP Recent Labs  Lab 02/17/18 1833  BNP 232.7*     DDimer  Recent Labs  Lab 02/22/18 1214  DDIMER 0.32     Radiology    No results found.  Cardiac Studies   Echo 02/19/18: IMPRESSIONS  1. The left ventricle has moderately reduced systolic function of 24-23%. The cavity size is normal. There is no left ventricular wall thickness. Echo evidence of Unable to assess diastolic filling patterns. 2. The right ventricle is in size. There is severely reduced systolic function. 3. Severely dilated left atrial size. 4. Mildly dilated right atrial size. 5. The mitral valve normal in structure. Regurgitation is mild to moderate by color flow Doppler. 6. Normal tricuspid valve. 7. Tricuspid regurgitation is mild. 8. The aortic valve tricuspid. Aortic valve regurgitation is mild by color flow Doppler. 9. Pulmonic valve regurgitation is mild by color flow  Doppler. 10. No atrial level shunt detected by color flow Doppler.  RIGHT/LEFT HEART CATH AND CORONARY ANGIOGRAPHY  02/22/2018  Conclusion   1. Elevated right and left heart filling pressures.  2. Low cardiac output.  3. Mild-moderate pulmonary hypertension.  4. Nonobstructive CAD.    Right Heart Pressures RHC Procedural Findings: Hemodynamics (mmHg) RA mean 15 RV 45/15 PA 47/24, mean 38 PCWP mean 22 LV 96/24 AO 99/67  Oxygen saturations: PA 56% AO 97%  Cardiac Output (Fick) 3.2  Cardiac Index (Fick) 1.9 PVR 5 WU    Patient Profile     78 y.o. female  with hypertension, GERD and benign meningeal tumor and seizures here with new onset atrial flutter found to have reduced LV and RV function.   Assessment & Plan   1. New onset atrial flutter: - Rate currently stable on  Toprol XL 100mg  daily and amiodarone gtt (DC diltiazem CD 360 mg due to low EF) -Plan for TEE/DCCV this afternoon however patient ate, so will do tomorrow -This patients CHA2DS2-VASc Scoreis 5 (CHF, HTN, Age (2), female).She is currently on subcu Lovenox for anticoagulation. Since patient is on Tegretol we are avoiding DOAC.The patient has been started on Keppra and neurology recommends weaning off Tegretol over 12 days. Plan for bridge with warfarin during weaning process and can start DOACin a couple of weeks once Tegretol is fully weaned off.   2. Acute systolic and diastolic heart failure: -LVEF 35-40% by echo 02/19/2018. R & L cath showed elevated right and left heart filling pressures. Low cardiac output. 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  - Net I & O +132cc. Weight down 2 lb since admit (155>>153lb).  - Continue Lasix 40mg  BID IV and Toprol XL 100mg  daily.  - Her renal function is stable as well as blood pressure.  - Consider guideline directed heart failure therapy. ? Heart failure evaluation. Will defer to MD. Patient is euvolemic.   3.Hypertension: -BP stable on  BB  4.Hyperlipidemia: -OnPravastatin 40 mg daily  For questions or updates, please contact [place] Please consult [email or web address] for contact info under        SignedLeanor Kail, PA  02/23/2018, 9:54 AM     Patient seen and examined.  Agree with assessment and plan.  Breathing much better.  I/O -367.  Lungs clear without rales.  No edema.  ECG from this morning still shows atrial flutter 89 bpm with LVH.  She is on Lovenox for anticoagulation.  Plan for DC cardioversion tomorrow.   Troy Sine, MD, [facility] 02/23/2018 12:13 PM

## 2018-02-23 NOTE — Plan of Care (Signed)
  Problem: Education: Goal: Knowledge of General Education information will improve Description Including pain rating scale, medication(s)/side effects and non-pharmacologic comfort measures Outcome: Progressing   Problem: Clinical Measurements: Goal: Ability to maintain clinical measurements within normal limits will improve Outcome: Progressing   Problem: Clinical Measurements: Goal: Diagnostic test results will improve Outcome: Progressing   Problem: Activity: Goal: Risk for activity intolerance will decrease Outcome: Progressing   Problem: Coping: Goal: Level of anxiety will decrease Outcome: Progressing

## 2018-02-23 NOTE — Plan of Care (Signed)
  Problem: Education: Goal: Knowledge of General Education information will improve Description Including pain rating scale, medication(s)/side effects and non-pharmacologic comfort measures Outcome: Progressing   Problem: Clinical Measurements: Goal: Ability to maintain clinical measurements within normal limits will improve Outcome: Progressing   Problem: Activity: Goal: Risk for activity intolerance will decrease Outcome: Progressing   Problem: Elimination: Goal: Will not experience complications related to bowel motility Outcome: Progressing   Problem: Pain Managment: Goal: General experience of comfort will improve Outcome: Progressing   Problem: Skin Integrity: Goal: Risk for impaired skin integrity will decrease Outcome: Progressing   

## 2018-02-23 NOTE — Progress Notes (Signed)
TR band off. Pressure dressing applied. No bleeding noted. Heparin started per order. Patient educated to call nurse if bleeding noted. Rn will continue hourly rounding and monitor for any signs of bleeding. No complaints at this time. Patient in bed resting. Call bell within reach.

## 2018-02-23 NOTE — Progress Notes (Addendum)
ANTICOAGULATION CONSULT NOTE - Follow-Up Consult  Pharmacy Consult for Heparin bridge (post-cath) + Warfarin Indication: atrial fibrillation  Allergies  Allergen Reactions  . Dilantin [Phenytoin Sodium Extended]      Stupor, ataxia.   Marland Kitchen Hydrocodone Nausea And Vomiting    Patient Measurements: Height: 5\' 1"  (154.9 cm) Weight: 153 lb 4.8 oz (69.5 kg) IBW/kg (Calculated) : 47.8 Heparin Dosing Weight: 62.9 kg  Vital Signs: Temp: 98 F (36.7 C) (02/04 0423) Temp Source: Oral (02/04 0423) BP: 120/73 (02/04 0423) Pulse Rate: 89 (02/04 0423)  Labs: Recent Labs    02/21/18 0534 02/22/18 0548 02/22/18 1415 02/22/18 1416 02/23/18 0501  HGB 12.4 12.7 12.6 12.9 12.2  HCT 39.2 39.2 37.0 38.0 38.6  PLT 306 293  --   --  268  LABPROT 14.1 14.3  --   --  15.7*  INR 1.10 1.12  --   --  1.26  CREATININE  --  0.91  --   --  0.85    Estimated Creatinine Clearance: 47.9 mL/min (by C-G formula based on SCr of 0.85 mg/dL).   Medical History: Past Medical History:  Diagnosis Date  . Benign tumor of meninges ([place])   . GERD (gastroesophageal reflux disease)    occ. in past  . Headache(784.0)   . HTN (hypertension), benign    pt. states no hypertension  . Pneumonia    ? as child  . PONV (postoperative nausea and vomiting)   . Seizures ([place])    positive for HBP    Medications:  Scheduled:  . carbamazepine  100 mg Oral BID   Followed by  . [START ON 02/26/2018] carbamazepine  100 mg Oral Daily  . furosemide  40 mg Intravenous BID  . levETIRAcetam  500 mg Oral BID  . metoprolol succinate  100 mg Oral Daily  . pravastatin  40 mg Oral Daily  . sodium chloride flush  3 mL Intravenous Q12H  . Warfarin - Pharmacist Dosing Inpatient   Does not apply q1800   Infusions:  . sodium chloride    . amiodarone 30 mg/hr (02/23/18 0316)  . heparin 750 Units/hr (02/23/18 0316)    Assessment: 78yo female with new onset afib. Patientswitched from heparin infusion to warfarin with lovenox  bridge 02/19/18 PM for new onset atrial fibrillation. Patient will need enoxaparin (Lovenox) 60 mg syringes #10 at discharge and education for lovenox injections.  Of note, patient is currently being tapered off of carbamazepine. INR will need to be closely followed at discharge as carbamazepine induces the metabolism of warfarin, thus causing a decreased INR. Would expect as carbamazepine is tapered off that patient's INR will increase. Additionally, amiodarone has been started on patient beginning 02/21/18, which can also increase INR of warfarin.    Patient received first dose of warfarin on 1/31. Dose held 2/1 per MD to ensure patient wasn't supratherapeutic for planned cath on 2/3. The patient is s/p cath 2/3 and plans are to bridge with Heparin starting 8 hours post-sheath removal and resume warfarin dosing.  Initial heparin level at goal, will confirm this evening. INR has trended up to 1.26 after two doses of warfarin. Hgb down this morning but stable overall if considering the past few days with Hgb currently at 12.2, plt count wnl. No bleeding issues noted.   Will watch INR closely with numerous drug interaction with warfarin.   Goal of Therapy:  Heparin level goal 0.3-0.7 INR 2-3   Plan:  - Continue Heparin at 750 unit/hr (7.5  ml/hr)  - Warfarin 7.5 mg x 1 dose at 1800 today - Will continue to monitor for any signs/symptoms of bleeding  Thank you for allowing pharmacy to be a part of this patient's care.  Erin Hearing PharmD., BCPS Clinical Pharmacist 02/23/2018 7:37 AM  **Pharmacist phone directory can now be found on [email or web address] (PW [email or web address]).  Listed under [place].

## 2018-02-23 NOTE — Plan of Care (Signed)
  Problem: Education: Goal: Knowledge of General Education information will improve Description: Including pain rating scale, medication(s)/side effects and non-pharmacologic comfort measures Outcome: Progressing   Problem: Clinical Measurements: Goal: Will remain free from infection Outcome: Progressing   Problem: Clinical Measurements: Goal: Diagnostic test results will improve Outcome: Progressing   

## 2018-02-24 ENCOUNTER — Encounter (HOSPITAL_COMMUNITY)
Admission: EM | Disposition: A | Discharge status: Home / Self Care | Payer: Self-pay | Source: Ambulatory Visit | Attending: Cardiology

## 2018-02-24 ENCOUNTER — Inpatient Hospital Stay (HOSPITAL_COMMUNITY): Payer: Medicare Other | Admitting: Anesthesiology

## 2018-02-24 ENCOUNTER — Encounter (HOSPITAL_COMMUNITY): Payer: Self-pay | Admitting: *Deleted

## 2018-02-24 ENCOUNTER — Inpatient Hospital Stay (HOSPITAL_COMMUNITY): Payer: Medicare Other

## 2018-02-24 DIAGNOSIS — Z7901 Long term (current) use of anticoagulants: Secondary | ICD-10-CM

## 2018-02-24 DIAGNOSIS — I351 Nonrheumatic aortic (valve) insufficiency: Secondary | ICD-10-CM

## 2018-02-24 DIAGNOSIS — I340 Nonrheumatic mitral (valve) insufficiency: Secondary | ICD-10-CM

## 2018-02-24 HISTORY — PX: CARDIOVERSION: SHX1299

## 2018-02-24 HISTORY — PX: TEE WITHOUT CARDIOVERSION: SHX5443

## 2018-02-24 LAB — CBC
HCT: 38.2 % (ref 36.0–46.0)
Hemoglobin: 12.2 g/dL (ref 12.0–15.0)
MCH: 28.5 pg (ref 26.0–34.0)
MCHC: 31.9 g/dL (ref 30.0–36.0)
MCV: 89.3 fL (ref 80.0–100.0)
Platelets: 243 10*3/uL (ref 150–400)
RBC: 4.28 MIL/uL (ref 3.87–5.11)
RDW: 13.3 % (ref 11.5–15.5)
WBC: 8.4 10*3/uL (ref 4.0–10.5)
nRBC: 0 % (ref 0.0–0.2)

## 2018-02-24 LAB — BASIC METABOLIC PANEL
Anion gap: 11 (ref 5–15)
BUN: 18 mg/dL (ref 8–23)
CO2: 29 mmol/L (ref 22–32)
Calcium: 8.8 mg/dL — ABNORMAL LOW (ref 8.9–10.3)
Chloride: 98 mmol/L (ref 98–111)
Creatinine, Ser: 0.88 mg/dL (ref 0.44–1.00)
GFR calc Af Amer: >60 mL/min (ref >60)
GFR calc non Af Amer: >60 mL/min (ref >60)
Glucose, Bld: 127 mg/dL — ABNORMAL HIGH (ref 70–99)
Potassium: 3.3 mmol/L — ABNORMAL LOW (ref 3.5–5.1)
Sodium: 138 mmol/L (ref 135–145)

## 2018-02-24 LAB — HEPARIN LEVEL (UNFRACTIONATED): Heparin Unfractionated: 0.7 IU/mL (ref 0.30–0.70)

## 2018-02-24 LAB — PROTIME-INR
INR: 1.63
Prothrombin Time: 19.1 seconds — ABNORMAL HIGH (ref 11.4–15.2)

## 2018-02-24 SURGERY — ECHOCARDIOGRAM, TRANSESOPHAGEAL
Anesthesia: General

## 2018-02-24 MED ORDER — LACTATED RINGERS IV SOLN
INTRAVENOUS | Status: DC | PRN
  Administered 2018-02-24: 14:00:00 via INTRAVENOUS

## 2018-02-24 MED ORDER — LISINOPRIL 5 MG PO TABS
2.5000 mg | ORAL_TABLET | Freq: Every day | ORAL | Status: DC
  Administered 2018-02-24 – 2018-02-25 (×2): 2.5 mg via ORAL
  Filled 2018-02-24 (×2): qty 1

## 2018-02-24 MED ORDER — PROPOFOL 10 MG/ML IV BOLUS
INTRAVENOUS | Status: DC | PRN
  Administered 2018-02-24: 30 mg via INTRAVENOUS

## 2018-02-24 MED ORDER — WARFARIN SODIUM 7.5 MG PO TABS
7.5000 mg | ORAL_TABLET | Freq: Once | ORAL | Status: AC
  Administered 2018-02-24: 7.5 mg via ORAL
  Filled 2018-02-24 (×2): qty 1

## 2018-02-24 MED ORDER — PERFLUTREN LIPID MICROSPHERE
INTRAVENOUS | Status: AC
  Filled 2018-02-24: qty 10

## 2018-02-24 MED ORDER — PERFLUTREN LIPID MICROSPHERE
INTRAVENOUS | Status: DC | PRN
  Administered 2018-02-24: 3 mL via INTRAVENOUS

## 2018-02-24 MED ORDER — PROPOFOL 500 MG/50ML IV EMUL
INTRAVENOUS | Status: DC | PRN
  Administered 2018-02-24: 100 ug/kg/min via INTRAVENOUS

## 2018-02-24 MED ORDER — POTASSIUM CHLORIDE CRYS ER 20 MEQ PO TBCR
40.0000 meq | EXTENDED_RELEASE_TABLET | ORAL | Status: AC
  Administered 2018-02-24 (×2): 40 meq via ORAL
  Filled 2018-02-24 (×2): qty 2

## 2018-02-24 MED ORDER — FUROSEMIDE 40 MG PO TABS
40.0000 mg | ORAL_TABLET | Freq: Every day | ORAL | Status: DC
  Administered 2018-02-25: 40 mg via ORAL
  Filled 2018-02-24: qty 1

## 2018-02-24 NOTE — Progress Notes (Signed)
ANTICOAGULATION CONSULT NOTE - Follow-Up Consult  Pharmacy Consult for Heparin bridge (post-cath) + Warfarin Indication: atrial fibrillation  Allergies  Allergen Reactions  . Dilantin [Phenytoin Sodium Extended]      Stupor, ataxia.   Marland Kitchen Hydrocodone Nausea And Vomiting    Patient Measurements: Height: 5\' 1"  (154.9 cm) Weight: 148 lb 4.8 oz (67.3 kg) IBW/kg (Calculated) : 47.8 Heparin Dosing Weight: 62.9 kg  Vital Signs: Temp: 97.5 F (36.4 C) (02/05 0813) Temp Source: Oral (02/05 0813) BP: 119/85 (02/05 0813) Pulse Rate: 101 (02/05 0813)  Labs: Recent Labs    02/22/18 0548  02/22/18 1416 02/23/18 0501 02/23/18 0815 02/24/18 0334  HGB 12.7   < > 12.9 12.2  --  12.2  HCT 39.2   < > 38.0 38.6  --  38.2  PLT 293  --   --  268  --  243  LABPROT 14.3  --   --  15.7*  --   --   INR 1.12  --   --  1.26  --   --   HEPARINUNFRC  --   --   --   --  0.51 0.70  CREATININE 0.91  --   --  0.85  --  0.88   < > = values in this interval not displayed.    Estimated Creatinine Clearance: 45.5 mL/min (by C-G formula based on SCr of 0.88 mg/dL).   Medical History: Past Medical History:  Diagnosis Date  . Benign tumor of meninges ([place])   . GERD (gastroesophageal reflux disease)    occ. in past  . Headache(784.0)   . HTN (hypertension), benign    pt. states no hypertension  . Pneumonia    ? as child  . PONV (postoperative nausea and vomiting)   . Seizures ([place])    positive for HBP    Medications:  Scheduled:  . carbamazepine  100 mg Oral BID   Followed by  . [START ON 02/26/2018] carbamazepine  100 mg Oral Daily  . furosemide  40 mg Intravenous BID  . levETIRAcetam  500 mg Oral BID  . metoprolol succinate  100 mg Oral Daily  . pravastatin  40 mg Oral Daily  . sodium chloride flush  10-40 mL Intracatheter Q12H  . sodium chloride flush  3 mL Intravenous Q12H  . Warfarin - Pharmacist Dosing Inpatient   Does not apply q1800   Infusions:  . sodium chloride    .  amiodarone 30 mg/hr (02/23/18 2127)  . heparin 750 Units/hr (02/24/18 1224)    Assessment: 78yo female with new onset afib. Patientswitched from heparin infusion to warfarin with lovenox bridge 02/19/18 PM for new onset atrial fibrillation. Patient will need enoxaparin (Lovenox) 60 mg syringes #10 at discharge and education for lovenox injections.  Of note, patient is currently being tapered off of carbamazepine. INR will need to be closely followed at discharge as carbamazepine induces the metabolism of warfarin, thus causing a decreased INR. Would expect as carbamazepine is tapered off that patient's INR will increase. Additionally, amiodarone has been started on patient beginning 02/21/18, which can also increase INR of warfarin.    Heparin level at goal. INR has trended up to 1.6. Hgb stable this morning at 12.2, plt count wnl. No bleeding issues noted.   Will watch INR closely with numerous drug interaction with warfarin.   Goal of Therapy:  Heparin level goal 0.3-0.7 INR 2-3   Plan:  - Continue Heparin at 700 unit/hr (7 ml/hr) - Follow up  transition to lovenox if needed prior to d/c  - Warfarin 7.5mg  again tonight - Will continue to monitor for any signs/symptoms of bleeding  Thank you for allowing pharmacy to be a part of this patient's care.  Erin Hearing PharmD., BCPS Clinical Pharmacist 02/24/2018 8:27 AM  **Pharmacist phone directory can now be found on [email or web address] ([email or web address]).  Listed under [place].

## 2018-02-24 NOTE — CV Procedure (Signed)
    PROCEDURE NOTE:  Procedure:  Transesophageal echocardiogram Operator:  Fransico Him, MD Indications:  Atrial fibrillation Complications: None  During this procedure the patient is administered a total of propofol 200 mg  to achieve and maintain moderate conscious sedation.  The patient's heart rate, blood pressure, and oxygen saturation are monitored continuously during the procedure by anesthesia.   Results:  LV normal size and moderate reduced LF function with EF 35-40% Normal RV size with severe RV dysfunction Mildly dilated  RA Severely dilated  LA with no evidence of thrombus.  Normal large LA appendage with no evidence of thrombus.  Definity contrast was used.  The emptying velocity was 30-40. Normal TV with mild TR Normal PV with mild PR Normal MV with mild to moderate MR Normal trileaflet AV with mild AR Normal interatrial septum with no evidence of shunt by colorflow dopper  Normal thoracic and ascending aorta.  The patient tolerated the procedure well and was transferred back to their room in stable condition.  Signed: Fransico Him, MD [facility]     Electrical Cardioversion Procedure Note Maureen [name] [id number] 1940/08/30  Procedure: Electrical Cardioversion Indications:  Atrial Fibrillation  Time Out: Verified patient identification, verified procedure,medications/allergies/relevent history reviewed, required imaging and test results available.  Performed  Procedure Details  The patient was NPO after midnight. Anesthesia was administered at the beside  by Dr.Fitzgerald as stated above in TEE note.  Cardioversion was done with synchronized biphasic defibrillation with AP pads with 150watts.  The patient converted to normal sinus rhythm. The patient tolerated the procedure well   IMPRESSION:  Successful cardioversion of atrial fibrillation    Traci Turner 02/24/2018, 1:19 PM

## 2018-02-24 NOTE — Interval H&P Note (Signed)
History and Physical Interval Note:  02/24/2018 1:18 PM  Maureen [name]  has presented today for surgery, with the diagnosis of a fib  The various methods of treatment have been discussed with the patient and family. After consideration of risks, benefits and other options for treatment, the patient has consented to  Procedure(s): TRANSESOPHAGEAL ECHOCARDIOGRAM (TEE) (N/A) CARDIOVERSION (N/A) as a surgical intervention .  The patient's history has been reviewed, patient examined, no change in status, stable for surgery.  I have reviewed the patient's chart and labs.  Questions were answered to the patient's satisfaction.     Fransico Him

## 2018-02-24 NOTE — Anesthesia Postprocedure Evaluation (Signed)
Anesthesia Post Note  Patient: Maureen [name]  Procedure(s) Performed: TRANSESOPHAGEAL ECHOCARDIOGRAM (TEE) (N/A ) CARDIOVERSION (N/A )     Patient location during evaluation: PACU Anesthesia Type: General Level of consciousness: awake and alert Pain management: pain level controlled Vital Signs Assessment: post-procedure vital signs reviewed and stable Respiratory status: spontaneous breathing, nonlabored ventilation and respiratory function stable Cardiovascular status: blood pressure returned to baseline and stable Postop Assessment: no apparent nausea or vomiting Anesthetic complications: no    Last Vitals:  Vitals:   02/24/18 1509 02/24/18 1520  BP: (!) 93/55 (!) 106/46  Pulse: (!) 50 (!) 46  Resp: 18 17  Temp: 36.6 C   SpO2: 97% 94%    Last Pain:  Vitals:   02/24/18 1530  TempSrc:   PainSc: 0-No pain                 Brennan Bailey

## 2018-02-24 NOTE — Transfer of Care (Signed)
Immediate Anesthesia Transfer of Care Note  Patient: Maureen [name]  Procedure(s) Performed: TRANSESOPHAGEAL ECHOCARDIOGRAM (TEE) (N/A ) CARDIOVERSION (N/A )  Patient Location: Endoscopy Unit  Anesthesia Type:MAC  Level of Consciousness: drowsy and patient cooperative  Airway & Oxygen Therapy: Patient Spontanous Breathing and Patient connected to nasal cannula oxygen  Post-op Assessment: Report given to RN, Post -op Vital signs reviewed and stable and Patient moving all extremities X 4  Post vital signs: Reviewed and stable  Last Vitals:  Vitals Value Taken Time  BP    Temp    Pulse    Resp    SpO2      Last Pain:  Vitals:   02/24/18 1416  TempSrc:   PainSc: 0-No pain         Complications: No apparent anesthesia complications

## 2018-02-24 NOTE — Anesthesia Preprocedure Evaluation (Addendum)
Anesthesia Evaluation  Patient identified by MRN, date of birth, ID band Patient awake    Reviewed: Allergy & Precautions, H&P , NPO status , Patient's Chart, lab work & pertinent test results  History of Anesthesia Complications (+) PONV  Airway Mallampati: III  TM Distance: >3 FB Neck ROM: Full    Dental no notable dental hx. (+) Teeth Intact, Dental Advisory Given   Pulmonary neg pulmonary ROS,    Pulmonary exam normal breath sounds clear to auscultation       Cardiovascular hypertension, Pt. on medications + dysrhythmias Atrial Fibrillation  Rhythm:Irregular Rate:Normal     Neuro/Psych  Headaches, Seizures -, Well Controlled,  negative psych ROS   GI/Hepatic Neg liver ROS, GERD  ,  Endo/Other  negative endocrine ROS  Renal/GU negative Renal ROS  negative genitourinary   Musculoskeletal   Abdominal   Peds  Hematology negative hematology ROS (+)   Anesthesia Other Findings   Reproductive/Obstetrics negative OB ROS                            Anesthesia Physical Anesthesia Plan  ASA: III  Anesthesia Plan: General   Post-op Pain Management:    Induction: Intravenous  PONV Risk Score and Plan: 4 or greater and Treatment may vary due to age or medical condition and Propofol infusion  Airway Management Planned: Mask  Additional Equipment:   Intra-op Plan:   Post-operative Plan:   Informed Consent: I have reviewed the patients History and Physical, chart, labs and discussed the procedure including the risks, benefits and alternatives for the proposed anesthesia with the patient or authorized representative who has indicated his/her understanding and acceptance.     Dental advisory given  Plan Discussed with: CRNA  Anesthesia Plan Comments:         Anesthesia Quick Evaluation

## 2018-02-24 NOTE — Progress Notes (Signed)
  Echocardiogram Echocardiogram Transesophageal has been performed.  Maureen [name] 02/24/2018, 3:11 PM

## 2018-02-24 NOTE — Progress Notes (Addendum)
Progress Note  Patient Name: SIMRANJIT THAYER Date of Encounter: 02/24/2018  Primary Cardiologist: Glenetta Hew, MD   Subjective   For TEE/DCCV today. No chest pain or dyspnea.   Inpatient Medications    Scheduled Meds: . carbamazepine  100 mg Oral BID   Followed by  . [START ON 02/26/2018] carbamazepine  100 mg Oral Daily  . furosemide  40 mg Intravenous BID  . levETIRAcetam  500 mg Oral BID  . metoprolol succinate  100 mg Oral Daily  . potassium chloride  40 mEq Oral Q4H  . pravastatin  40 mg Oral Daily  . sodium chloride flush  10-40 mL Intracatheter Q12H  . sodium chloride flush  3 mL Intravenous Q12H  . Warfarin - Pharmacist Dosing Inpatient   Does not apply q1800   Continuous Infusions: . sodium chloride    . amiodarone 30 mg/hr (02/24/18 0955)  . heparin 700 Units/hr (02/24/18 0909)   PRN Meds: sodium chloride, acetaminophen, acetaminophen, albuterol, metoprolol tartrate, ondansetron (ZOFRAN) IV, sodium chloride flush, sodium chloride flush   Vital Signs    Vitals:   02/23/18 2217 02/24/18 0505 02/24/18 0508 02/24/18 0813  BP: 132/86  123/82 119/85  Pulse: (!) 101  99 (!) 101  Resp: 18  18   Temp: 98.2 F (36.8 C)  97.8 F (36.6 C) (!) 97.5 F (36.4 C)  TempSrc: Oral  Oral Oral  SpO2: 97%  97% 96%  Weight:  67.3 kg    Height:        Intake/Output Summary (Last 24 hours) at 02/24/2018 1111 Last data filed at 02/24/2018 0855 Gross per 24 hour  Intake 240 ml  Output 1000 ml  Net -760 ml   Last 3 Weights 02/24/2018 02/23/2018 02/22/2018  Weight (lbs) 148 lb 4.8 oz 153 lb 4.8 oz 152 lb 4.8 oz  Weight (kg) 67.268 kg 69.536 kg 69.083 kg      Telemetry    Atrial flutter at 70s - Personally Reviewed  ECG    N/A  Physical Exam   GEN: No acute distress.   Neck: No JVD Cardiac: RRR, no murmurs, rubs, or gallops.  Respiratory: Clear to auscultation bilaterally. GI: Soft, nontender, non-distended  MS: No edema; No deformity. Neuro:  Nonfocal  Psych:  Normal affect   Labs    Chemistry Recent Labs  Lab 02/22/18 0548  02/22/18 1416 02/23/18 0501 02/24/18 0334  NA 139   < > 138 138 138  K 4.5   < > 4.3 4.0 3.3*  CL 104  --   --  102 98  CO2 26  --   --  26 29  GLUCOSE 124*  --   --  118* 127*  BUN 23  --   --  25* 18  CREATININE 0.91  --   --  0.85 0.88  CALCIUM 8.8*  --   --  8.9 8.8*  GFRNONAA >60  --   --  >60 >60  GFRAA >60  --   --  >60 >60  ANIONGAP 9  --   --  10 11   < > = values in this interval not displayed.     Hematology Recent Labs  Lab 02/22/18 0548  02/22/18 1416 02/23/18 0501 02/24/18 0334  WBC 7.3  --   --  8.9 8.4  RBC 4.27  --   --  4.28 4.28  HGB 12.7   < > 12.9 12.2 12.2  HCT 39.2   < > 38.0  38.6 38.2  MCV 91.8  --   --  90.2 89.3  MCH 29.7  --   --  28.5 28.5  MCHC 32.4  --   --  31.6 31.9  RDW 14.0  --   --  13.8 13.3  PLT 293  --   --  268 243   < > = values in this interval not displayed.    Cardiac Enzymes Recent Labs  Lab 02/17/18 1833 02/18/18 0027 02/18/18 0604  TROPONINI <0.03 <0.03 <0.03    Recent Labs  Lab 02/17/18 1353  TROPIPOC 0.02     BNP Recent Labs  Lab 02/17/18 1833  BNP 232.7*     DDimer  Recent Labs  Lab 02/22/18 1214  DDIMER 0.32     Radiology    No results found.  Cardiac Studies   Echo 02/19/18: IMPRESSIONS  1. The left ventricle has moderately reduced systolic function of 78-67%. The cavity size is normal. There is no left ventricular wall thickness. Echo evidence of Unable to assess diastolic filling patterns. 2. The right ventricle is in size. There is severely reduced systolic function. 3. Severely dilated left atrial size. 4. Mildly dilated right atrial size. 5. The mitral valve normal in structure. Regurgitation is mild to moderate by color flow Doppler. 6. Normal tricuspid valve. 7. Tricuspid regurgitation is mild. 8. The aortic valve tricuspid. Aortic valve regurgitation is mild by color flow Doppler. 9. Pulmonic valve  regurgitation is mild by color flow Doppler. 10. No atrial level shunt detected by color flow Doppler.  RIGHT/LEFT HEART CATH AND CORONARY ANGIOGRAPHY  02/22/2018  Conclusion   1. Elevated right and left heart filling pressures.  2. Low cardiac output.  3. Mild-moderate pulmonary hypertension.  4. Nonobstructive CAD.    Right Heart Pressures R[facility] Procedural Findings: Hemodynamics (mmHg) RA mean 15 RV 45/15 PA 47/24, mean 38 PCWP mean 22 LV 96/24 AO 99/67  Oxygen saturations: PA 56% AO 97%  Cardiac Output (Fick) 3.2  Cardiac Index (Fick) 1.9 PVR 5 WU    Patient Profile     78 y.o. female with hypertension, GERD and benign meningeal tumor and seizures here with new onset atrial flutterfound to have reduced LV and RV function.  Assessment & Plan   1. New onset atrial flutter: - Rate currently stable on Toprol XL 100mg  daily and amiodarone gtt (DC diltiazem CD 360 mg due to low EF) -This patients CHA2DS2-VASc Scoreis 5 (CHF, HTN, Age (2), female).Since patient is on Tegretol we are avoiding DOAC.The patient has been started on Keppra and neurology recommends weaning off Tegretol over 12 days. Plan for bridge with warfarin while on heparin during weaning process and can start DOACin a couple of weeks once Tegretol is fully weaned off. send home on Lovenox.   2. Acute systolic and diastolic heart failure: -LVEF 35-40% by echo 02/19/2018. R & L cath showed elevated right and left heart filling pressures. Low cardiac output. 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 Euvolemic.  - Net I & O -827cc2cc. Weight down 7 lb since admit (155>>148lb).  - She is on Lasix 40mg  BID IV and Toprol XL 100mg  daily.  - Her renal function is stable as well as blood pressure. Will change lasix to 40mg  po daily and add low dose ACE.   3.Hypertension: -BP stable on BB. Add ACE as above  4.Hyperlipidemia: -OnPravastatin 40 mg daily  5. Hypokalemia: - Supplement ordered    For  questions or updates, please contact [place] Please consult [email or web address]  for contact info under      Signed, Leanor Kail, PA  02/24/2018, 11:11 AM      Patient seen and examined. Agree with assessment and plan. No chest pain or dyspnea. On schedule for TEE/DCCV today for atrial flutter. K 3.3; received KCL.   Troy Sine, MD, [facility] 02/24/2018 12:58 PM

## 2018-02-25 ENCOUNTER — Encounter (HOSPITAL_COMMUNITY): Payer: Self-pay | Admitting: Cardiology

## 2018-02-25 LAB — CBC
HCT: 37.1 % (ref 36.0–46.0)
Hemoglobin: 11.7 g/dL — ABNORMAL LOW (ref 12.0–15.0)
MCH: 28.8 pg (ref 26.0–34.0)
MCHC: 31.5 g/dL (ref 30.0–36.0)
MCV: 91.4 fL (ref 80.0–100.0)
Platelets: 273 10*3/uL (ref 150–400)
RBC: 4.06 MIL/uL (ref 3.87–5.11)
RDW: 13.5 % (ref 11.5–15.5)
WBC: 7.7 10*3/uL (ref 4.0–10.5)
nRBC: 0 % (ref 0.0–0.2)

## 2018-02-25 LAB — PROTIME-INR
INR: 2.15
Prothrombin Time: 23.7 seconds — ABNORMAL HIGH (ref 11.4–15.2)

## 2018-02-25 LAB — HEPARIN LEVEL (UNFRACTIONATED): Heparin Unfractionated: 0.68 IU/mL (ref 0.30–0.70)

## 2018-02-25 MED ORDER — LEVETIRACETAM 500 MG PO TABS: 500.0000 mg | ORAL_TABLET | Freq: Two times a day (BID) | ORAL | 3 refills | Status: DC

## 2018-02-25 MED ORDER — METOPROLOL SUCCINATE ER 100 MG PO TB24: 100.0000 mg | ORAL_TABLET | Freq: Every day | ORAL | 6 refills | Status: DC

## 2018-02-25 MED ORDER — WARFARIN SODIUM 5 MG PO TABS: 5.0000 mg | ORAL_TABLET | Freq: Every day | ORAL | 3 refills | Status: DC

## 2018-02-25 MED ORDER — WARFARIN SODIUM 5 MG PO TABS: 5.0000 mg | ORAL_TABLET | Freq: Every day | ORAL | Status: DC

## 2018-02-25 MED ORDER — LISINOPRIL 2.5 MG PO TABS: 2.5000 mg | ORAL_TABLET | Freq: Every day | ORAL | 6 refills | Status: DC

## 2018-02-25 MED ORDER — CARBAMAZEPINE ER 100 MG PO TB12: 100.0000 mg | ORAL_TABLET | Freq: Every day | ORAL | 0 refills | Status: DC

## 2018-02-25 MED ORDER — FUROSEMIDE 40 MG PO TABS: 40.0000 mg | ORAL_TABLET | Freq: Every day | ORAL | 6 refills | Status: DC

## 2018-02-25 MED FILL — Perflutren Lipid Microsphere IV Susp 1.1 MG/ML: INTRAVENOUS | Qty: 10 | Status: AC

## 2018-02-25 MED FILL — METOPROLOL SUCCINATE ER 100: 100 | 30 days supply | Qty: 30 | Fill #0 | Status: TO

## 2018-02-25 MED FILL — WARFARIN SODIUM 5 MG TABLET: 5 | 30 days supply | Qty: 30 | Fill #0 | Status: TO

## 2018-02-25 MED FILL — FUROSEMIDE 40 MG TABLET: 40 | 30 days supply | Qty: 30 | Fill #0 | Status: TO

## 2018-02-25 MED FILL — TEGRETOL XR 100 MG TABLET: 100 | 5 days supply | Qty: 5 | Fill #0 | Status: TO

## 2018-02-25 MED FILL — LISINOPRIL 2.5 MG TABLET: 2.5 | 30 days supply | Qty: 30 | Fill #0 | Status: TO

## 2018-02-25 MED FILL — levETIRAcetam 500 MG TABS: 500 | 30 days supply | Qty: 60 | Fill #0 | Status: TO

## 2018-02-25 NOTE — Care Management Important Message (Signed)
Important Message  Patient Details  Name: Maureen [name] MRN: [id number] Date of Birth: 1940-07-30   Medicare Important Message Given:  Yes    Barb Merino Dellar Traber 02/25/2018, 12:54 PM

## 2018-02-25 NOTE — Discharge Summary (Signed)
Discharge Summary    Patient ID: Maureen [name] MRN: [id number]; DOB: [birth year]  Admit date: 02/17/2018 Discharge date: 02/25/2018  Primary Care Provider: Myrtis Ser, CNM  Primary Cardiologist: Glenetta Hew, MD   Discharge Diagnoses    Active Problems:   Atrial fibrillation with RVR ([place])  Acute systolic and diastolic heart failure  HTN  HLd  Hypokalemia  Allergies Allergies  Allergen Reactions  . Dilantin [Phenytoin Sodium Extended]      Stupor, ataxia.   Marland Kitchen Hydrocodone Nausea And Vomiting    Diagnostic Studies/Procedures    TEE 02/24/18 Results: LV normalsize and moderate reduced LFfunctionwith EF 35-40% Normal RV sizewith severe RV dysfunction Mildly dilatedRA Severely dilatedLA with no evidence of thrombus. Normal large LA appendage with no evidence of thrombus. Definity contrast was used. The emptying velocity was 30-40. Normal TVwith mild TR Normal PVwith mild PR Normal MVwith mild to moderate MR Normal trileaflet AVwith mild AR Normal interatrial septum with no evidence of shunt by colorflow dopper  Normal thoracic and ascending aorta.  Echo 02/19/18: IMPRESSIONS  1. The left ventricle has moderately reduced systolic function of 26-94%. The cavity size is normal. There is no left ventricular wall thickness. Echo evidence of Unable to assess diastolic filling patterns. 2. The right ventricle is in size. There is severely reduced systolic function. 3. Severely dilated left atrial size. 4. Mildly dilated right atrial size. 5. The mitral valve normal in structure. Regurgitation is mild to moderate by color flow Doppler. 6. Normal tricuspid valve. 7. Tricuspid regurgitation is mild. 8. The aortic valve tricuspid. Aortic valve regurgitation is mild by color flow Doppler. 9. Pulmonic valve regurgitation is mild by color flow Doppler. 10. No atrial level shunt detected by color flow Doppler.  RIGHT/LEFT HEART CATH AND CORONARY  ANGIOGRAPHY2/03/2018  Conclusion   1. Elevated right and left heart filling pressures.  2. Low cardiac output.  3. Mild-moderate pulmonary hypertension.  4. Nonobstructive CAD.    Right Heart Pressures RHC Procedural Findings: Hemodynamics (mmHg) RA mean 15 RV 45/15 PA 47/24, mean 38 PCWP mean 22 LV 96/24 AO 99/67  Oxygen saturations: PA 56% AO 97%  Cardiac Output (Fick) 3.2  Cardiac Index (Fick) 1.9 PVR 5 WU      History of Present Illness     Maureen [name] is a 78 y.o. female with a hx of HTN, GERD, benign meninges tumor and history of seizures who presented from her PCP office with complaints of new onset atrial fibrillation.   Patient reported she initially began having lower extremity swelling and shortness of breath last week.  Over the last 2 days her shortness of breath has worsened and she began to notice chest heaviness with difficulty with deep inspiration. She stated that she had LE edema last week as well as symptoms consistent with orthopnea. She denied recent illness to include fever, chills, N/V, chest heaviness radiation, dizziness, palpitations, or syncope. She has never experienced this before and has no prior hx of CAD. She deniedrecent long distance travel and was in her usual state of health up until several days age. Given her persistent symptom, she was seen by her PCP in which an EKG was performed and showed what appears to be sinus tachycardia with a rate of 117. She was then transported to the ED for further evaluation.   In the ED, EKG performed which appears to be consistent with atrial fibrillation with RVR with a heart rate at 127 although difficult to determine secondary to artifact.  Initial i-STAT troponin drawn at 0.00 likely in the setting of rapid rate.  All other labs within normal limits.  TSH normal at 3.211. CXR performed with probable small layering right pleural effusion with mild bibasilar sub-segmental atelectasis with no other  active cardiopulmonary disease.  Patient was started on IV diltiazem with initiation of a bolus and then 10 mg/h with no significant improvement in HR.  She was not on home AV blocking agents.  Hospital Course     Consultants: neurology   1. New onset atrial fibrillation with RVR - Unfortunately with Tegretol >> started on heparin and potentially bridge to warfarin as DOAC's would be contraindicated. The patient has been started on Keppra and neurology recommends weaning off Tegretol over 12 days.Trated with warfarin while on heparinduring weaning process.  - Her Gtt Cardizem consolidated to long acting CD with improved rate but later discontinued given low EF. Due to recurrent RVR she was started on IV amiodarone with improved rate. Due to symptomatic RVR she under went successful TEE cardioversion. She maintained sinus rhythm overnight at rate of 50-60s on Toprol XL 100mg  daily. She is now off IV amiodarone, no plan to reinitiate unless recurrent afib. Her INR 2.15 today. She will be discharge on Coumadin and plan to switch to NOAC once weaned off Tegretol.   2. Acute systolic and diastolic heart failure: -LVEF 35-40% by echo 02/19/2018. Due to low EF, she underwent R & L cath which showed elevated right and left heart filling pressures.Low cardiac output. 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 Net I &O1.5L.  Weight down8lb since admit (155>>147lb).  - Continue lasix, BB and ACE at current dose. Up - titrate as needed  3. HTN - BP stable on current medications.  The patient has been seen by Dr. Claiborne Billings today and deemed ready for discharge home. All follow-up appointments have been scheduled. Discharge medications are listed below.   Discharge Vitals Blood pressure 121/72, pulse (!) 57, temperature 97.6 F (36.4 C), temperature source Oral, resp. rate 18, height 5\' 1"  (1.549 m), weight 66.8 kg, SpO2 95 %.  Filed Weights   02/23/18 0423 02/24/18 0505 02/25/18 0500  Weight: 69.5 kg 67.3 kg  66.8 kg    Labs & Radiologic Studies    CBC Recent Labs    02/24/18 0334 02/25/18 0350  WBC 8.4 7.7  HGB 12.2 11.7*  HCT 38.2 37.1  MCV 89.3 91.4  PLT 243 629   Basic Metabolic Panel Recent Labs    02/23/18 0501 02/24/18 0334  NA 138 138  K 4.0 3.3*  CL 102 98  CO2 26 29  GLUCOSE 118* 127*  BUN 25* 18  CREATININE 0.85 0.88  CALCIUM 8.9 8.8*  MG 2.0  --    _____________  Dg Chest 2 View  Result Date: 02/17/2018 CLINICAL DATA:  Initial evaluation for acute shortness of breath with cough. EXAM: CHEST - 2 VIEW COMPARISON:  Prior radiograph from 11/13/2017 FINDINGS: Transverse heart size at the upper limits of normal. Mediastinal silhouette within normal limits. Lungs normally inflated. Mild scattered bibasilar subsegmental atelectasis. No consolidative opacity. No pulmonary edema. Probable small right pleural effusion seen layering posteriorly on lateral projection. No pneumothorax. No acute osseous finding. IMPRESSION: 1. Probable small layering right pleural effusion. 2. Mild bibasilar subsegmental atelectasis. No other active cardiopulmonary disease. Electronically Signed   By: Jeannine Boga M.D.   On: 02/17/2018 15:16   Disposition   Pt is being discharged home today in good condition.  Follow-up Plans & Appointments  Follow-up Information    [facility]. Go on 03/01/2018.   Specialty:  Cardiology Why:  @ 1:45 for INR check Contact information: [address] [place], [place], [place]. Go on 03/11/2018.   Specialties:  Cardiology, Radiology Why:  @9am  for hospital follow up. Please arrive 15 minutes early  Contact information: [address] [place] [address] [phone]          Discharge Instructions    Diet - low sodium heart healthy   Complete by:  As directed    Increase activity slowly   Complete by:  As directed       Discharge Medications     Allergies as of 02/25/2018      Reactions   Dilantin [phenytoin Sodium Extended]     Stupor, ataxia.    Hydrocodone Nausea And Vomiting      Medication List    TAKE these medications   aspirin EC 81 MG tablet Take 81 mg by mouth daily.   calcium-vitamin D 500-200 MG-UNIT tablet Commonly known as:  OSCAL WITH D Take 1 tablet by mouth 2 (two) times daily.   carbamazepine 100 MG 12 hr tablet Commonly known as:  TEGRETOL XR Take 1 tablet (100 mg total) by mouth daily. Take one table tonight and they daily in morning for 4 days Start taking on:  February 26, 2018 What changed:    medication strength  how much to take  when to take this  additional instructions   cholecalciferol 1000 units tablet Commonly known as:  VITAMIN D Take 1,000 Units by mouth daily.   furosemide 40 MG tablet Commonly known as:  LASIX Take 1 tablet (40 mg total) by mouth daily. Start taking on:  February 26, 2018   levETIRAcetam 500 MG tablet Commonly known as:  KEPPRA Take 1 tablet (500 mg total) by mouth 2 (two) times daily.   lisinopril 2.5 MG tablet Commonly known as:  PRINIVIL,ZESTRIL Take 1 tablet (2.5 mg total) by mouth daily. Start taking on:  February 26, 2018   metoprolol succinate 100 MG 24 hr tablet Commonly known as:  TOPROL-XL Take 1 tablet (100 mg total) by mouth daily. Take with or immediately following a meal. Start taking on:  February 26, 2018   multivitamin with minerals Tabs tablet Take 1 tablet by mouth daily.   pravastatin 40 MG tablet Commonly known as:  PRAVACHOL Take 40 mg by mouth daily.   PROAIR HFA 108 (90 Base) MCG/ACT inhaler Generic drug:  albuterol Inhale 2 puffs into the lungs every 4 (four) hours as needed.   warfarin 5 MG tablet Commonly known as:  COUMADIN Take 1 tablet (5 mg total) by mouth daily at 6 PM.        Acute coronary syndrome (MI, NSTEMI, STEMI, etc) this admission?: No.    Outstanding Labs/Studies   None  Duration of  Discharge Encounter   Greater than 30 minutes including physician time.  Jarrett Soho, PA 02/25/2018, 1:39 PM

## 2018-02-25 NOTE — Progress Notes (Signed)
ANTICOAGULATION CONSULT NOTE - Follow-Up Consult  Pharmacy Consult for Heparin bridge (post-cath) + Warfarin Indication: atrial fibrillation  Allergies  Allergen Reactions  . Dilantin [Phenytoin Sodium Extended]      Stupor, ataxia.   Marland Kitchen Hydrocodone Nausea And Vomiting    Patient Measurements: Height: 5\' 1"  (154.9 cm) Weight: 147 lb 4.8 oz (66.8 kg) IBW/kg (Calculated) : 47.8 Heparin Dosing Weight: 62.9 kg  Vital Signs: Temp: 97.6 F (36.4 C) (02/06 0504) Temp Source: Oral (02/06 0504) BP: 100/54 (02/06 0504) Pulse Rate: 57 (02/06 0504)  Labs: Recent Labs    02/23/18 0501 02/23/18 0815 02/24/18 0334 02/24/18 1313 02/25/18 0350  HGB 12.2  --  12.2  --  11.7*  HCT 38.6  --  38.2  --  37.1  PLT 268  --  243  --  273  LABPROT 15.7*  --   --  19.1* 23.7*  INR 1.26  --   --  1.63 2.15  HEPARINUNFRC  --  0.51 0.70  --  0.68  CREATININE 0.85  --  0.88  --   --     Estimated Creatinine Clearance: 45.3 mL/min (by C-G formula based on SCr of 0.88 mg/dL).   Medical History: Past Medical History:  Diagnosis Date  . Benign tumor of meninges ([place])   . GERD (gastroesophageal reflux disease)    occ. in past  . Headache(784.0)   . HTN (hypertension), benign    pt. states no hypertension  . Pneumonia    ? as child  . PONV (postoperative nausea and vomiting)   . Seizures ([place])    positive for HBP    Medications:  Scheduled:  . carbamazepine  100 mg Oral BID   Followed by  . [START ON 02/26/2018] carbamazepine  100 mg Oral Daily  . furosemide  40 mg Oral Daily  . levETIRAcetam  500 mg Oral BID  . lisinopril  2.5 mg Oral Daily  . metoprolol succinate  100 mg Oral Daily  . pravastatin  40 mg Oral Daily  . sodium chloride flush  10-40 mL Intracatheter Q12H  . sodium chloride flush  3 mL Intravenous Q12H  . Warfarin - Pharmacist Dosing Inpatient   Does not apply q1800   Infusions:  . sodium chloride    . amiodarone Stopped (02/24/18 1422)  . heparin 700 Units/hr  (02/24/18 1420)    Assessment: 78yo female with new onset afib. Patientswitched from heparin infusion to warfarin with lovenox bridge 02/19/18 PM for new onset atrial fibrillation. Patient will need enoxaparin (Lovenox) 60 mg syringes #10 at discharge and education for lovenox injections.  Of note, patient is currently being tapered off of carbamazepine. INR will need to be closely followed at discharge as carbamazepine induces the metabolism of warfarin, thus causing a decreased INR. Would expect as carbamazepine is tapered off that patient's INR will increase. Additionally, amiodarone has been started on patient beginning 02/21/18, which can also increase INR of warfarin.    Heparin level at goal. INR has trended up to 2.1. Hgb stable but down slightly to 11.7 this morning. No bleeding issues noted. Post dccv yesterday  Will watch INR closely with numerous drug interaction with warfarin.   Goal of Therapy:  Heparin level goal 0.3-0.7 INR 2-3   Plan:  - Continue Heparin at 700 unit/hr (7 ml/hr) - likely stop today - No lovenox is need at this time  - Warfarin 5mg  daily at discharge, would have follow up early next week for recheck  Thank you for allowing pharmacy to be a part of this patient's care.  Erin Hearing PharmD., BCPS Clinical Pharmacist 02/25/2018 7:26 AM  **Pharmacist phone directory can now be found on [email or web address] ([email or web address]).  Listed under [place].

## 2018-02-25 NOTE — Progress Notes (Addendum)
Progress Note  Patient Name: Maureen [name] Date of Encounter: 02/25/2018  Primary Cardiologist: Glenetta Hew, MD   Subjective   Feeling well. No chest pain, sob or palpitations.   Inpatient Medications    Scheduled Meds: . carbamazepine  100 mg Oral BID   Followed by  . [START ON 02/26/2018] carbamazepine  100 mg Oral Daily  . furosemide  40 mg Oral Daily  . levETIRAcetam  500 mg Oral BID  . lisinopril  2.5 mg Oral Daily  . metoprolol succinate  100 mg Oral Daily  . pravastatin  40 mg Oral Daily  . sodium chloride flush  10-40 mL Intracatheter Q12H  . sodium chloride flush  3 mL Intravenous Q12H  . warfarin  5 mg Oral q1800  . Warfarin - Pharmacist Dosing Inpatient   Does not apply q1800   Continuous Infusions: . sodium chloride    . amiodarone Stopped (02/24/18 1422)   PRN Meds: sodium chloride, acetaminophen, acetaminophen, albuterol, metoprolol tartrate, ondansetron (ZOFRAN) IV, sodium chloride flush, sodium chloride flush   Vital Signs    Vitals:   02/24/18 1520 02/24/18 2047 02/25/18 0500 02/25/18 0504  BP: (!) 106/46 (!) 102/57  (!) 100/54  Pulse: (!) 46 (!) 58  (!) 57  Resp: 17 18  18   Temp:  98.7 F (37.1 C)  97.6 F (36.4 C)  TempSrc:  Oral  Oral  SpO2: 94% 97%  95%  Weight:   66.8 kg   Height:        Intake/Output Summary (Last 24 hours) at 02/25/2018 1007 Last data filed at 02/25/2018 0506 Gross per 24 hour  Intake 10 ml  Output 150 ml  Net -140 ml   Last 3 Weights 02/25/2018 02/24/2018 02/23/2018  Weight (lbs) 147 lb 4.8 oz 148 lb 4.8 oz 153 lb 4.8 oz  Weight (kg) 66.815 kg 67.268 kg 69.536 kg      Telemetry    SR at rate of 50s - Personally Reviewed  ECG    SR at rate of 52bpm, inferior TWI- Personally Reviewed  Physical Exam   GEN: No acute distress.   Neck: No JVD Cardiac: RRR, no murmurs, rubs, or gallops.  Respiratory: Clear to auscultation bilaterally. GI: Soft, nontender, non-distended  MS: No edema; No deformity. Neuro:   Nonfocal  Psych: Normal affect   Labs    Chemistry Recent Labs  Lab 02/22/18 0548  02/22/18 1416 02/23/18 0501 02/24/18 0334  NA 139   < > 138 138 138  K 4.5   < > 4.3 4.0 3.3*  CL 104  --   --  102 98  CO2 26  --   --  26 29  GLUCOSE 124*  --   --  118* 127*  BUN 23  --   --  25* 18  CREATININE 0.91  --   --  0.85 0.88  CALCIUM 8.8*  --   --  8.9 8.8*  GFRNONAA >60  --   --  >60 >60  GFRAA >60  --   --  >60 >60  ANIONGAP 9  --   --  10 11   < > = values in this interval not displayed.     Hematology Recent Labs  Lab 02/23/18 0501 02/24/18 0334 02/25/18 0350  WBC 8.9 8.4 7.7  RBC 4.28 4.28 4.06  HGB 12.2 12.2 11.7*  HCT 38.6 38.2 37.1  MCV 90.2 89.3 91.4  MCH 28.5 28.5 28.8  MCHC 31.6 31.9 31.5  RDW 13.8 13.3 13.5  PLT 268 243 273    Cardiac EnzymesNo results for input(s): TROPONINI in the last 168 hours. No results for input(s): TROPIPOC in the last 168 hours.   BNPNo results for input(s): BNP, PROBNP in the last 168 hours.   DDimer  Recent Labs  Lab 02/22/18 1214  DDIMER 0.32     Radiology    No results found.  Cardiac Studies   TEE 02/24/18 Results:  LV normal size and moderate reduced LF function with EF 35-40% Normal RV size with severe RV dysfunction Mildly dilated  RA Severely dilated  LA with no evidence of thrombus.  Normal large LA appendage with no evidence of thrombus.  Definity contrast was used.  The emptying velocity was 30-40. Normal TV with mild TR Normal PV with mild PR Normal MV with mild to moderate MR Normal trileaflet AV with mild AR Normal interatrial septum with no evidence of shunt by colorflow dopper  Normal thoracic and ascending aorta.  Echo 02/19/18: IMPRESSIONS  1. The left ventricle has moderately reduced systolic function of 58-52%. The cavity size is normal. There is no left ventricular wall thickness. Echo evidence of Unable to assess diastolic filling patterns. 2. The right ventricle is in size. There is  severely reduced systolic function. 3. Severely dilated left atrial size. 4. Mildly dilated right atrial size. 5. The mitral valve normal in structure. Regurgitation is mild to moderate by color flow Doppler. 6. Normal tricuspid valve. 7. Tricuspid regurgitation is mild. 8. The aortic valve tricuspid. Aortic valve regurgitation is mild by color flow Doppler. 9. Pulmonic valve regurgitation is mild by color flow Doppler. 10. No atrial level shunt detected by color flow Doppler.  RIGHT/LEFT HEART CATH AND CORONARY ANGIOGRAPHY2/03/2018  Conclusion   1. Elevated right and left heart filling pressures.  2. Low cardiac output.  3. Mild-moderate pulmonary hypertension.  4. Nonobstructive CAD.    Right Heart Pressures RHC Procedural Findings: Hemodynamics (mmHg) RA mean 15 RV 45/15 PA 47/24, mean 38 PCWP mean 22 LV 96/24 AO 99/67  Oxygen saturations: PA 56% AO 97%  Cardiac Output (Fick) 3.2  Cardiac Index (Fick) 1.9 PVR 5 WU    Patient Profile   78 y.o.femalewith hypertension, GERD and benign meningeal tumor and seizures here with new onset atrial flutterfound to have reduced LV and RV function.  Assessment & Plan   1. New onset atrial flutter with RVR: -Her rate stabilized onToprol XL 100mg  daily and amiodarone gtt (DC diltiazem CD 360 mgdue to low EF). Underwent successful cardioversion yesterday. Maintaining sinus rhythm at rate of 50s. MD to review rate control agent. Currently on Toprol XL ? Po amiodarone at discharge.  -This patients CHA2DS2-VASc Scoreis 5 (CHF, HTN, Age (2), female).Since patient is on Tegretol we are avoiding DOAC.The patient has been started on Keppra and neurology recommends weaning off Tegretol over 12 days.Trated with warfarin while on heparin during weaning process.  - INR 2.15 today. Will send home on Coumadin only.   2. Acute systolic and diastolic heart failure: -LVEF 35-40% by echo 02/19/2018. R & L cath showed  elevated right and left heart filling pressures.Low cardiac output. 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 Euvolemic.  - Net I &O 1.5L.  Weight down 8 lb since admit (155>>147lb).  - Continue lasix, BB and ACE.   3.Hypertension: -stable on current medications.   4.Hyperlipidemia: -OnPravastatin 40 mg daily  MD to review toprol and amiodarone.     For questions or updates, please contact [facility] Please consult [email or web address] for contact info under        SignedLeanor Kail, PA  02/25/2018, 10:07 AM   Patient seen and examined. Agree with assessment and plan.  Ms. Dalal is status post TEE guided DC cardioversion yesterday.  She is maintaining sinus rhythm currently at 60 bpm.  She is now off IV amiodarone.  We will continue with Toprol-XL 100 mg.  Continue low-dose ACE inhibition EF 35 to 40% noted.  Okay for discharge later today follow-up with Dr. Ellyn Hack.   Troy Sine, MD, [facility] 02/25/2018 12:09 PM

## 2018-02-25 NOTE — Discharge Instructions (Signed)
Information on my medicine - Coumadin   (Warfarin)  This medication education was reviewed with me or my healthcare representative as part of my discharge preparation.  The pharmacist that spoke with me during my hospital stay was:  Britt Boozer, [facility], [place]  Why was Coumadin prescribed for you? Coumadin was prescribed for you because you have a blood clot or a medical condition that can cause an increased risk of forming blood clots. Blood clots can cause serious health problems by blocking the flow of blood to the heart, lung, or brain. Coumadin can prevent harmful blood clots from forming. As a reminder your indication for Coumadin is:   Stroke Prevention Because Of Atrial Fibrillation  What test will check on my response to Coumadin? While on Coumadin (warfarin) you will need to have an INR test regularly to ensure that your dose is keeping you in the desired range. The INR (international normalized ratio) number is calculated from the result of the laboratory test called prothrombin time (PT).  If an INR APPOINTMENT HAS NOT ALREADY BEEN MADE FOR YOU please schedule an appointment to have this lab work done by your health care provider within 7 days. Your INR goal is usually a number between:  2 to 3 or your provider may give you a more narrow range like 2-2.5.  Ask your health care provider during an office visit what your goal INR is.  What  do you need to  know  About  COUMADIN? Take Coumadin (warfarin) exactly as prescribed by your healthcare provider about the same time each day.  DO NOT stop taking without talking to the doctor who prescribed the medication.  Stopping without other blood clot prevention medication to take the place of Coumadin may increase your risk of developing a new clot or stroke.  Get refills before you run out.  What do you do if you miss a dose? If you miss a dose, take it as soon as you remember on the same day then continue your regularly scheduled regimen the next  day.  Do not take two doses of Coumadin at the same time.  Important Safety Information A possible side effect of Coumadin (Warfarin) is an increased risk of bleeding. You should call your healthcare provider right away if you experience any of the following: ? Bleeding from an injury or your nose that does not stop. ? Unusual colored urine (red or dark brown) or unusual colored stools (red or black). ? Unusual bruising for unknown reasons. ? A serious fall or if you hit your head (even if there is no bleeding).  Some foods or medicines interact with Coumadin (warfarin) and might alter your response to warfarin. To help avoid this: ? Eat a balanced diet, maintaining a consistent amount of Vitamin K. ? Notify your provider about major diet changes you plan to make. ? Avoid alcohol or limit your intake to 1 drink for women and 2 drinks for men per day. (1 drink is 5 oz. wine, 12 oz. beer, or 1.5 oz. liquor.)  Make sure that ANY health care provider who prescribes medication for you knows that you are taking Coumadin (warfarin).  Also make sure the healthcare provider who is monitoring your Coumadin knows when you have started a new medication including herbals and non-prescription products.  Coumadin (Warfarin)  Major Drug Interactions  Increased Warfarin Effect Decreased Warfarin Effect  Alcohol (large quantities) Antibiotics (esp. Septra/Bactrim, Flagyl, Cipro) Amiodarone (Cordarone) Aspirin (ASA) Cimetidine (Tagamet) Megestrol (Megace) NSAIDs (ibuprofen,  naproxen, etc.) °Piroxicam (Feldene) °Propafenone (Rythmol SR) °Propranolol (Inderal) °Isoniazid (INH) °Posaconazole (Noxafil) Barbiturates (Phenobarbital) °Carbamazepine (Tegretol) °Chlordiazepoxide (Librium) °Cholestyramine (Questran) °Griseofulvin °Oral Contraceptives °Rifampin °Sucralfate (Carafate) °Vitamin K  ° °Coumadin® (Warfarin) Major Herbal Interactions  °Increased Warfarin Effect Decreased Warfarin Effect   °Garlic °Ginseng °Ginkgo biloba Coenzyme Q10 °Green tea °St. John’s wort   ° °Coumadin® (Warfarin) FOOD Interactions  °Eat a consistent number of servings per week of foods HIGH in Vitamin K °(1 serving = ½ cup)  °Collards (cooked, or boiled & drained) °Kale (cooked, or boiled & drained) °Mustard greens (cooked, or boiled & drained) °Parsley *serving size only = ¼ cup °Spinach (cooked, or boiled & drained) °Swiss chard (cooked, or boiled & drained) °Turnip greens (cooked, or boiled & drained)  °Eat a consistent number of servings per week of foods MEDIUM-HIGH in Vitamin K °(1 serving = 1 cup)  °Asparagus (cooked, or boiled & drained) °Broccoli (cooked, boiled & drained, or raw & chopped) °Brussel sprouts (cooked, or boiled & drained) *serving size only = ½ cup °Lettuce, raw (green leaf, endive, romaine) °Spinach, raw °Turnip greens, raw & chopped  ° °These websites have more information on Coumadin (warfarin):  www.coumadin.com; °www.ahrq.gov/consumer/coumadin.htm; ° ° ° °

## 2018-03-01 ENCOUNTER — Ambulatory Visit (INDEPENDENT_AMBULATORY_CARE_PROVIDER_SITE_OTHER): Payer: Medicare Other | Admitting: *Deleted

## 2018-03-01 DIAGNOSIS — I4891 Unspecified atrial fibrillation: Secondary | ICD-10-CM | POA: Diagnosis not present

## 2018-03-01 DIAGNOSIS — Z7901 Long term (current) use of anticoagulants: Secondary | ICD-10-CM

## 2018-03-01 LAB — POCT INR: INR: 1.6 — AB (ref 2.0–3.0)

## 2018-03-01 NOTE — Patient Instructions (Addendum)
Description   Start taking 1 tablet daily except 1.5 tablets on Mondays and Fridays. Recheck in one week. Call us any medication changes or concerns # [phone].        Vitamin K Foods and Warfarin Warfarin is a blood thinner (anticoagulant). Anticoagulant medicines help prevent the formation of blood clots. These medicines work by decreasing the activity of vitamin K, which promotes normal blood clotting. When you take warfarin, problems can occur from suddenly increasing or decreasing the amount of vitamin K that you eat from one day to the next. Problems may include:  Blood clots.  Bleeding. What general guidelines do I need to follow? To avoid problems when taking warfarin:  Eat a balanced diet that includes: ? Fresh fruits and vegetables. ? Whole grains. ? Low-fat dairy products. ? Lean proteins, such as fish, eggs, and lean cuts of meat.  Keep your intake of vitamin K consistent from day to day. To do this: ? Avoid eating large amounts of vitamin K one day and low amounts of vitamin K the next day. ? If you take a multivitamin that contains vitamin K, be sure to take it every day. ? Know which foods contain vitamin K. Use the lists below to understand serving sizes and the amount of vitamin K in one serving.  Avoid major changes in your diet. If you are going to change your diet, talk with your health care provider before making changes.  Work with a Financial planner (dietitian) to develop a meal plan that works best for you.  High vitamin K foods Foods that are high in vitamin K contain more than 100 mcg (micrograms) per serving. These include:  Broccoli (cooked) -  cup has 110 mcg.  Brussels sprouts (cooked) -  cup has 109 mcg.  Greens, beet (cooked) -  cup has 350 mcg.  Greens, collard (cooked) -  cup has 418 mcg.  Greens, turnip (cooked) -  cup has 265 mcg.  Green onions or scallions -  cup has 105 mcg.  Kale (fresh or frozen) -  cup has 531  mcg.  Parsley (raw) - 10 sprigs has 164 mcg.  Spinach (cooked) -  cup has 444 mcg.  Swiss chard (cooked) -  cup has 287 mcg. Moderate vitamin K foods Foods that have a moderate amount of vitamin K contain 25-100 mcg per serving. These include:  Asparagus (cooked) - 5 spears have 38 mcg.  Black-eyed peas (dried) -  cup has 32 mcg.  Cabbage (cooked) -  cup has 37 mcg.  Kiwi fruit - 1 medium has 31 mcg.  Lettuce - 1 cup has 57-63 mcg.  Okra (frozen) -  cup has 44 mcg.  Prunes (dried) - 5 prunes have 25 mcg.  Watercress (raw) - 1 cup has 85 mcg. Low vitamin K foods Foods low in vitamin K contain less than 25 mcg per serving. These include:  Artichoke - 1 medium has 18 mcg.  Avocado - 1 oz. has 6 mcg.  Blueberries -  cup has 14 mcg.  Cabbage (raw) -  cup has 21 mcg.  Carrots (cooked) -  cup has 11 mcg.  Cauliflower (raw) -  cup has 11 mcg.  Cucumber with peel (raw) -  cup has 9 mcg.  Grapes -  cup has 12 mcg.  Mango - 1 medium has 9 mcg.  Nuts - 1 oz. has 15 mcg.  Pear - 1 medium has 8 mcg.  Peas (cooked) -  cup has 19 mcg.  Pickles - 1 spear has 14 mcg.  Pumpkin seeds - 1 oz. has 13 mcg.  Sauerkraut (canned) -  cup has 16 mcg.  Soybeans (cooked) -  cup has 16 mcg.  Tomato (raw) - 1 medium has 10 mcg.  Tomato sauce -  cup has 17 mcg. Vitamin K-free foods If a food contain less than 5 mcg per serving, it is considered to have no vitamin K. These foods include:  Bread and cereal products.  Cheese.  Eggs.  Fish and shellfish.  Meat and poultry.  Milk and dairy products.  Sunflower seeds. Actual amounts of vitamin K in foods may be different depending on processing. Talk with your dietitian about what foods you can eat and what foods you should avoid. This information is not intended to replace advice given to you by your health care provider. Make sure you discuss any questions you have with your health care provider. Document  Released: 11/03/2008 Document Revised: 07/29/2015 Document Reviewed: 04/11/2015 Elsevier Interactive Patient Education  2019 [employer].

## 2018-03-03 ENCOUNTER — Encounter: Payer: Self-pay | Admitting: Neurology

## 2018-03-03 ENCOUNTER — Ambulatory Visit: Payer: Medicare Other | Admitting: Neurology

## 2018-03-03 ENCOUNTER — Telehealth: Payer: Self-pay | Admitting: Neurology

## 2018-03-03 VITALS — BP 114/67 | HR 60 | Ht 61.5 in | Wt 145.0 lb

## 2018-03-03 DIAGNOSIS — D329 Benign neoplasm of meninges, unspecified: Secondary | ICD-10-CM

## 2018-03-03 DIAGNOSIS — I4891 Unspecified atrial fibrillation: Secondary | ICD-10-CM

## 2018-03-03 DIAGNOSIS — Z5181 Encounter for therapeutic drug level monitoring: Secondary | ICD-10-CM | POA: Diagnosis not present

## 2018-03-03 DIAGNOSIS — D332 Benign neoplasm of brain, unspecified: Secondary | ICD-10-CM

## 2018-03-03 DIAGNOSIS — G40109 Localization-related (focal) (partial) symptomatic epilepsy and epileptic syndromes with simple partial seizures, not intractable, without status epilepticus: Secondary | ICD-10-CM | POA: Diagnosis not present

## 2018-03-03 DIAGNOSIS — Z9889 Other specified postprocedural states: Secondary | ICD-10-CM

## 2018-03-03 MED ORDER — LEVETIRACETAM 500 MG PO TABS: 500.0000 mg | ORAL_TABLET | Freq: Two times a day (BID) | ORAL | 3 refills | Status: DC

## 2018-03-03 NOTE — Progress Notes (Signed)
PATIENT: Maureen [name] DOB: [birth year]  REASON FOR VISIT: follow up-seizures HISTORY FROM: patient  HISTORY OF PRESENT ILLNESS:  Interval history  03-02-2018;  Maureen [name] is an 78 year old female with a history of seizures and meningiomata. This patient is a left-handed Caucasian married female with a history of a left craniotomy for a left parasagittal meningioma resection on 01/04/1991. Recent admission to hospital ( 02-22-2018)  for new onset atrial fibrillation, echo and cardioversion.  Medications were changed due to need for anticoagulation, and the patient placed on Keppra, Leviracetam.  She was weaned off Carbamazepine- completely .  She was only during her hs hospital stay placed on amiodarone,  this needed not  to be continued after succesfull cardioversion. She remains on TOPROL, feels tired.   She is here for her routine yearly seizure follow up , but needs a sleep study for atrial fib risk work up. Atrial hypertrophy.   She remains hoarse since intubation.      Interval history from 11/10/2016 I have the pleasure of seeing Maureen [name]. 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 She denies any recent seizure events. The last possible seizure was 2002, years after her craniotomy. She reports concern of recurrence of meningeomata. She had her last MRI 20 month ago. Reviewed today the MRI study from January 2017, which documented multiple small meningiomata. These measure in the millimeter range. They could however have grown in the meanwhile. I will repeat this brain MRI study in November or December of this year    08-03-2015 MM. Maureen [name] is a 78 year old female with a history of seizures and meningiomata. She returns today for follow-up. She states that she is doing well. She continues to take Tegretol  XL and tolerating it well. Denies any seizure events. She operates a Teacher, music without difficulty. Denies any changes with her gait or balance. Able to complete all ADLs independently. She returns today for an evaluation.  HISTORY 07/11/14 (MM): Maureen [name] is a 78 year old female with a history of seizures and meningioma. She returns today for follow-up. Patient is currently taken Tegretol XL and tolerating it well. She reports that she is not had a seizure since the last visit. She is able to complete all ADLs independently. She operates a Teacher, music without difficulty. Since the last visit she does notice a mild tremor in the left hand is intermittent. She denies any changes with her gait or balance. She continues to take calcium and vitamin D supplementation for osteopenia She returns today for an evaluation.  HISTORY 05/26/13 (CD):  Maureen [name] is a 78 y.o. female seen at [facility] here as a transfer of care from [facility]. She is a retired Tourist information centre manager , with a Scientist, water quality , taught elementary school.  Patient has been followed him as her primary care provider by Dr. Osvaldo Human, now Dr Serita Grammes. This patient is a left-handed Caucasian married female with a history of a left craniotomy for a left parasagittal meningioma resection on 01/04/1991. Shehas been followed by Dr. Morene Antu since July 1992 . Her last seizure is now over 10 years ago.  Preoperatively she was treated with Decadron and Phenobarbital after she developed a seizure , focal to the right body, this let to an evaluation and the discovery of the meningeoma. After the surgery she was  started on Dilantin but developed a rash and became ataxic to this medication was discontinued. She continued for while to have abnormal sensations in her right leg ans sometimes right arm , which in most respects were identified a simple partial seizures. Inspired Dr. Tressia Danas last EEG was normal. She continued to the right foot and leg motor  seizures that she describes as a spasm of the right foot. Dr. Erling Cruz placed onTegretol since she had marked improvement. At the time of his last visit on 08/06/2009 the patient took brand nameTegretol-XR 200 mg twice a day she had a slight decrease in serum Na levels and 2008 underwent DEXA scan, which was normal. She still reported about once a year A symptom of leg spasm, no seizures per se. Possible symptom of spasm beginning in the right Foot and ankle up going to the right knee and then radiating downwards to the foot with a duration of less than a minute. A repeated DEXA scan revealed ion13 April 2014 newly diagnosed severe osteopenia. She begun taking vitamin D and calcium, recently in the last 6 month.   REVIEW OF SYSTEMS: Out of a complete 14 system review of symptoms, the patient complains only of the following symptoms, and all other reviewed systems are negative.  Joint pain, memory loss  ALLERGIES: Allergies  Allergen Reactions  . Dilantin [Phenytoin Sodium Extended]      Stupor, ataxia.   Marland Kitchen Hydrocodone Nausea And Vomiting    HOME MEDICATIONS: Outpatient Medications Prior to Visit  Medication Sig Dispense Refill  . aspirin EC 81 MG tablet Take 81 mg by mouth daily.    . calcium-vitamin D (OSCAL WITH D) 500-200 MG-UNIT per tablet Take 1 tablet by mouth 2 (two) times daily.     . cholecalciferol (VITAMIN D) 1000 UNITS tablet Take 1,000 Units by mouth daily.    . furosemide (LASIX) 40 MG tablet Take 1 tablet (40 mg total) by mouth daily. 30 tablet 6  . levETIRAcetam (KEPPRA) 500 MG tablet Take 1 tablet (500 mg total) by mouth 2 (two) times daily. 60 tablet 3  . lisinopril (PRINIVIL,ZESTRIL) 2.5 MG tablet Take 1 tablet (2.5 mg total) by mouth daily. 30 tablet 6  . metoprolol succinate (TOPROL-XL) 100 MG 24 hr tablet Take 1 tablet (100 mg total) by mouth daily. Take with or immediately following a meal. 30 tablet 6  . Multiple Vitamin (MULTIVITAMIN WITH MINERALS) TABS tablet  Take 1 tablet by mouth daily.    . pravastatin (PRAVACHOL) 40 MG tablet Take 40 mg by mouth daily.     Marland Kitchen PROAIR HFA 108 (90 Base) MCG/ACT inhaler Inhale 2 puffs into the lungs every 4 (four) hours as needed.  0  . warfarin (COUMADIN) 5 MG tablet Take 1 tablet (5 mg total) by mouth daily at 6 PM. 30 tablet 3  . carbamazepine (TEGRETOL XR) 100 MG 12 hr tablet Take 1 tablet (100 mg total) by mouth daily. Take one table tonight and they daily in morning for 4 days 5 tablet 0   No facility-administered medications prior to visit.     PAST MEDICAL HISTORY: Past Medical History:  Diagnosis Date  . Atrial fibrillation ([place])   . Benign tumor of meninges ([place])   . GERD (gastroesophageal reflux disease)    occ. in past  . Headache(784.0)   . HTN (hypertension), benign    pt. states no hypertension  . Pneumonia    ? as child  . PONV (postoperative nausea and vomiting)   .  Seizures ([place])    positive for HBP    PAST SURGICAL HISTORY: Past Surgical History:  Procedure Laterality Date  . CARDIOVERSION N/A 02/24/2018   Procedure: CARDIOVERSION;  Surgeon: Sueanne Margarita, MD;  Location[facility] ENDOSCOPY;  Service: Cardiovascular;  Laterality: N/A;  . CATARACT EXTRACTION, BILATERAL Bilateral   . CHOLECYSTECTOMY    . COLONOSCOPY WITH PROPOFOL N/A 11/30/2012   Procedure: COLONOSCOPY WITH PROPOFOL;  Surgeon: Garlan Fair, MD;  Location: [facility] ENDOSCOPY;  Service: Endoscopy;  Laterality: N/A;  . ESOPHAGOGASTRODUODENOSCOPY (EGD) WITH PROPOFOL N/A 11/30/2012   Procedure: ESOPHAGOGASTRODUODENOSCOPY (EGD) WITH PROPOFOL;  Surgeon: Garlan Fair, MD;  Location: [facility] ENDOSCOPY;  Service: Endoscopy;  Laterality: N/A;  . ESOPHAGOGASTRODUODENOSCOPY (EGD) WITH PROPOFOL N/A 03/14/2015   Procedure: ESOPHAGOGASTRODUODENOSCOPY (EGD) WITH PROPOFOL;  Surgeon: Arta Silence, MD;  Location: [facility] ENDOSCOPY;  Service: Endoscopy;  Laterality: N/A;  . EUS N/A 01/05/2013   Procedure: FULL UPPER ENDOSCOPIC ULTRASOUND (EUS)  RADIAL;  Surgeon: Arta Silence, MD;  Location: [facility] ENDOSCOPY;  Service: Endoscopy;  Laterality: N/A;  EUS with possible FNA  . EUS N/A 03/14/2015   Procedure: UPPER ENDOSCOPIC ULTRASOUND (EUS) RADIAL;  Surgeon: Arta Silence, MD;  Location: [facility] ENDOSCOPY;  Service: Endoscopy;  Laterality: N/A;  . EYE SURGERY Left    "few weeks ago scar tissue laser surgery"  . FINE NEEDLE ASPIRATION N/A 01/05/2013   Procedure: FINE NEEDLE ASPIRATION (FNA) RADIAL;  Surgeon: Arta Silence, MD;  Location: [facility] ENDOSCOPY;  Service: Endoscopy;  Laterality: N/A;  . KNEE ARTHROSCOPY Left   . [place]  12/1990  . RIGHT/LEFT HEART CATH AND CORONARY ANGIOGRAPHY N/A 02/22/2018   Procedure: RIGHT/LEFT HEART CATH AND CORONARY ANGIOGRAPHY;  Surgeon: Larey Dresser, MD;  Location: [facility] CV LAB;  Service: Cardiovascular;  Laterality: N/A;  . TEE WITHOUT CARDIOVERSION N/A 02/24/2018   Procedure: TRANSESOPHAGEAL ECHOCARDIOGRAM (TEE);  Surgeon: Sueanne Margarita, MD;  Location: [facility] ENDOSCOPY;  Service: Cardiovascular;  Laterality: N/A;  . VAGINAL HYSTERECTOMY  1983    FAMILY HISTORY: Family History  Problem Relation Age of Onset  . Dementia Father        lewy body dementia  . Heart disease Other        PACEMAKER DEFIB.      SOCIAL HISTORY: Social History   Socioeconomic History  . Marital status: Married    Spouse name: Maureen [name]  . Number of children: 2  . Years of education: Master's  . Highest education level: Not on file  Occupational History  . Occupation: retired    Comment: [employer] in [place]: [employer]  . Financial resource strain: Not on file  . Food insecurity:    Worry: Not on file    Inability: Not on file  . Transportation needs:    Medical: Not on file    Non-medical: Not on file  Tobacco Use  . Smoking status: Never Smoker  . Smokeless tobacco: Never Used  Substance and Sexual Activity  . Alcohol use: Yes    Alcohol/week: 0.0 standard drinks     Comment: once monthly  . Drug use: No  . Sexual activity: Not on file  Lifestyle  . Physical activity:    Days per week: Not on file    Minutes per session: Not on file  . Stress: Not on file  Relationships  . Social connections:    Talks on phone: Not on file    Gets together: Not on file  Attends religious service: Not on file    Active member of club or organization: Not on file    Attends meetings of clubs or organizations: Not on file    Relationship status: Not on file  . Intimate partner violence:    Fear of current or ex partner: Not on file    Emotionally abused: Not on file    Physically abused: Not on file    Forced sexual activity: Not on file  Other Topics Concern  . Not on file  Social History Narrative   She retired in 1995 from [employer]. Lives at home with her husband, Maureen [name]. They have a son age 4 and a  son age 15. Cafffeine once daily. a week and alcohol once a month. Denies tobacco and illicit drug use.    Patient is left handed.   Patient has a Master's degree.      PHYSICAL EXAM  Vitals:   03/03/18 1007  BP: 114/67  Pulse: 60  Weight: 145 lb (65.8 kg)  Height: 5' 1.5" (1.562 m)   Body mass index is 26.95 kg/m.  Generalized: Well developed, in no acute distress   Neurological examination  Mentation: Alert oriented to time, place, history taking. Follows all commands speech and language fluent Cranial nerve : She lost her sense of smell, believed this coincided with the hospital  stay, perhaps amiodarone.  Pupils are equal round reactive to light. She had cataract surgery.  Extraocular movements were full, visual field were full on confrontational test. Facial sensation and strength were normal. Uvula tongue midline. Head turning and shoulder shrug  were normal and symmetric. Motor: The motor testing reveals 5 over 5 strength of all 4 extremities. Good symmetric motor tone is noted throughout.  Sensory: Sensory testing is intact  to soft touch on all 4 extremities. No evidence of extinction is noted.  Coordination: Cerebellar testing reveals good finger-nose-finger and heel-to-shin bilaterally.  Gait and station: Gait is normal. Tandem gait is Slightly unsteady. Romberg is negative. No drift is seen.  Reflexes: Deep tendon reflexes are symmetric and normal bilaterally.   DIAGNOSTIC DATA (LABS, IMAGING, TESTING) - I reviewed patient records, labs, notes, testing and imaging myself where available.  PROCEDURE NOTE:  Procedure:  Transesophageal echocardiogram Operator:  Fransico Him, MD Indications:  Atrial fibrillation Complications: None  During this procedure the patient is administered a total of propofol 200 mg  to achieve and maintain moderate conscious sedation.  The patient's heart rate, blood pressure, and oxygen saturation are monitored continuously during the procedure by anesthesia.   Results:  LV normal size and moderate reduced LF function with EF 35-40% Normal RV size with severe RV dysfunction Mildly dilated  RA Severely dilated  LA with no evidence of thrombus.  Normal large LA appendage with no evidence of thrombus.  Definity contrast was used.  The emptying velocity was 30-40. Normal TV with mild TR Normal PV with mild PR Normal MV with mild to moderate MR Normal trileaflet AV with mild AR Normal interatrial septum with no evidence of shunt by colorflow dopper  Normal thoracic and ascending aorta.  The patient tolerated the procedure well and was transferred back to their room in stable condition.  Signed: Fransico Him, MD [facility] Electrical Cardioversion Procedure Note Maureen [name]  Author: Sueanne Margarita, MD Service: Cardiology Author Type: Physician  Filed: 02/24/2018 3:01 PM Date of Service: 02/24/2018 1:19 PM Status: Signed  Editor: Sueanne Margarita, MD (Physician)  Show:Clear all [x] Manual[x] Template[] Copied  Added by: [x] Turner, Eber Hong, MD  [] Hover for  details    PROCEDURE NOTE:  Procedure:  Transesophageal echocardiogram Operator:  Fransico Him, MD Indications:  Atrial fibrillation Complications: None  During this procedure the patient is administered a total of propofol 200 mg  to achieve and maintain moderate conscious sedation.  The patient's heart rate, blood pressure, and oxygen saturation are monitored continuously during the procedure by anesthesia.   Results:  LV normal size and moderate reduced LF function with EF 35-40% Normal RV size with severe RV dysfunction Mildly dilated  RA Severely dilated  LA with no evidence of thrombus.  Normal large LA appendage with no evidence of thrombus.  Definity contrast was used.  The emptying velocity was 30-40. Normal TV with mild TR Normal PV with mild PR Normal MV with mild to moderate MR Normal trileaflet AV with mild AR Normal interatrial septum with no evidence of shunt by colorflow dopper  Normal thoracic and ascending aorta.  The patient tolerated the procedure well and was transferred back to their room in stable condition.  Signed: Fransico Him, MD [facility]     Electrical Cardioversion Procedure Note RAEDYN KLINCK [id number] 01/12/1941  Procedure: Electrical Cardioversion Indications:  Atrial Fibrillation  Time Out: Verified patient identification, verified procedure,medications/allergies/relevent history reviewed, required imaging and test results available.  Performed  Procedure Details  The patient was NPO after midnight. Anesthesia was administered at the beside  by Dr.Fitzgerald as stated above in TEE note.  Cardioversion was done with synchronized biphasic defibrillation with AP pads with 150watts.  The patient converted to normal sinus rhythm. The patient tolerated the procedure well   IMPRESSION:  Successful cardioversion of atrial fibrillation    Traci Turner 02/24/2018, 1:19 PM         ASSESSMENT AND PLAN 78 y.o.  year old female  has a past medical history of Atrial fibrillation ([place]), Benign tumor of meninges ([place]), GERD (gastroesophageal reflux disease), Headache(784.0), HTN (hypertension), benign, Pneumonia, PONV (postoperative nausea and vomiting), and Seizures ([place]). here with:  1) Recent admission to hospital for new onset atrial fibrillation, echo and cardioversion.  Medications were changed due to need for anticoagulation, and the patient placed on Keppra, Leviracetam.  She was weaned off Carbamazepine- completely .  She was only during her hs hospital stay placed on amiodarone,  this needed not  to be continued after succesfull cardioversion. She remains on TOPROL, feels tired .  Needs a sleep study for atrial fib risks.   2. Remote history of seizure disorder following a transcranial resection of a meningioma. Last seizure over 15 years ago. She continuedto take carbamazepine in an extended release form 200 milligrams twice daily. Until February 2020, now on Keppra. Continue Keppra , no blood tests.   3.Recurrent meningeomata-  monitoring by MRI brain every 2 years.     She will follow-up in May/ June 2020 with NP.      03/03/2018, 10:53 AM [facility] [address], [place], [place] [address] [phone]

## 2018-03-03 NOTE — Progress Notes (Signed)
Transitions of Care Follow Up Call Note  Maureen [name] is an 78 y.o. female who presented to [facility] on 02/17/2018.  The patient had the following prescriptions filled at [facility]: LASIX, LISINOPRIL, TOPROL, KEPPRA, TEGRETOL, WARFARIN  Patient was called by pharmacist and HIPAA identifiers were verified. The following questions were asked about the prescriptions filled at [facility]:  Has the patient been experiencing any side effects to the medications prescribed? no Understanding of regimen: fair Understanding of indications: fair Potential of compliance: fair  Pharmacist comments: PT HAD F/U APPT W HER PCP AND INR WAS LOW. PCP IS MONITORING HER LEVELS AND GAVE HER A BOOSTER DOSE OF WARFARIN AND F/U APPT FOR RECHECK. PT ASKED THIS RPH WHY SHE WAS GIVEN WARFARIN AT D/C [facility] THAN [facility] (WHICH HER HUSBAND TAKES). I EXPLAINED TO THE PT THAT ANTICOAGULANTS HAVE AN INTERACTION WITH ANOTHER OF HER MEDS, TEGRETOL. WARFARIN WAS CHOSEN BC WE CAN MONITOR THE DRUG EFFECT (VIA INR) BUT WE ARE UNABLE TO MONITOR THE DOACS IN A WAY THAT WOULD ENSURE THE MEDICATION IS EFFECTIVE. PT SEEMED SATISFIED WITH MY RESPONSE AND THANKED ME.   [x]  Patient's prescriptions filled at the [facility] were transferred to the following pharmacy: [facility]. []  Patient unable to be reached after calling three times and prescriptions filled at the [facility] were transferred to preferred pharmacy found within their chart.   Mills Koller 03/03/2018, 5:35 PM Transitions of Care Pharmacy Hours: Monday - Friday 8:30am to 5:00 PM  Phone - [phone]

## 2018-03-03 NOTE — Patient Instructions (Signed)
Atrial Fibrillation Atrial fibrillation is a type of irregular or rapid heartbeat (arrhythmia). In atrial fibrillation, the top part of the heart (atria) quivers in a chaotic pattern. This makes the heart unable to pump blood normally. Having atrial fibrillation can increase your risk for other health problems, such as:  Blood can pool in the atria and form clots. If a clot travels to the brain, it can cause a stroke.  The heart muscle may weaken from the irregular blood flow. This can cause heart failure. Atrial fibrillation may start suddenly and stop on its own, or it may become a long-lasting problem. What are the causes? This condition is caused by some heart-related conditions or procedures, including:  High blood pressure. This is the most common cause.  Heart failure.  Heart valve conditions.  Inflammation of the sac that surrounds the heart (pericarditis).  Heart surgery.  Coronary artery disease.  Certain heart rhythm disorders, such as Wolf-Parkinson-White syndrome. Other causes include:  Pneumonia.  Obstructive sleep apnea.  Lung cancer.  Thyroid problems, especially if the thyroid is overactive (hyperthyroidism).  Excessive alcohol or drug use. Sometimes, the cause of this condition is not known. What increases the risk? This condition is more likely to develop in:  Older people.  People who smoke.  People who have diabetes mellitus.  People who are overweight (obese).  Athletes who exercise vigorously.  People who have a family history. What are the signs or symptoms? Symptoms of this condition include:  A feeling that your heart is beating rapidly or irregularly.  A feeling of discomfort or pain in your chest.  Shortness of breath.  Sudden light-headedness or weakness.  Getting tired easily during exercise. In some cases, there are no symptoms. How is this diagnosed? Your health care provider may be able to detect atrial fibrillation when  taking your pulse. If detected, this condition may be diagnosed with:  Electrocardiogram (ECG).  Ambulatory cardiac monitor. This device records your heartbeats for 24 hours or more.  Transthoracic echocardiogram (TTE) to evaluate how blood flows through your heart.  Transesophageal echocardiogram (TEE) to view more detailed images of your heart.  A stress test.  Imaging tests, such as a CT scan or chest X-ray.  Blood tests. How is this treated? This condition may be treated with:  Medicines to slow down the heart rate or bring the heart's rhythm back to normal.  Medicines to prevent blood clots from forming.  Electrical cardioversion. This delivers a low-energy shock to the heart to reset its rhythm.  Ablation. This procedure destroys the part of the heart tissue that sends abnormal signals.  Left atrial appendage occlusion/excision. This seals off a common place in the atria where blood clots can form (left atrial appendage). The goal of treatment is to prevent blood clots from forming and to keep your heart beating at a normal rate and rhythm. Treatment depends on underlying medical conditions and how you feel when you are experiencing fibrillation. Follow these instructions at home: Medicines  Take over-the counter and prescription medicines only as told by your health care provider.  If your health care provider prescribed a blood-thinning medicine (anticoagulant), take it exactly as told. Taking too much blood-thinning medicine can cause bleeding. Taking too little can enable a blood clot to form and travel to the brain, causing a stroke. Lifestyle      Do not use any products that contain nicotine or tobacco, such as cigarettes and e-cigarettes. If you need help quitting, ask your health   care provider.  Do not drink beverages that contain caffeine, such as coffee, soda, and tea.  Follow diet instructions as told by your health care provider.  Exercise regularly as  told by your health care provider.  Do not drink alcohol. General instructions  If you have obstructive sleep apnea, manage your condition as told by your health care provider.  Maintain a healthy weight. Do not use diet pills unless your health care provider approves. Diet pills may make heart problems worse.  Keep all follow-up visits as told by your health care provider. This is important. Contact a health care provider if you:  Notice a change in the rate, rhythm, or strength of your heartbeat.  Are taking an anticoagulant and you notice increased bruising.  Tire more easily when you exercise or exert yourself.  Have a sudden change in weight. Get help right away if you have:   Chest pain, abdominal pain, sweating, or weakness.  Difficulty breathing.  Blood in your vomit, stool (feces), or urine.  Any symptoms of a stroke. "BE FAST" is an easy way to remember the main warning signs of a stroke: ? B - Balance. Signs are dizziness, sudden trouble walking, or loss of balance. ? E - Eyes. Signs are trouble seeing or a sudden change in vision. ? F - Face. Signs are sudden weakness or numbness of the face, or the face or eyelid drooping on one side. ? A - Arms. Signs are weakness or numbness in an arm. This happens suddenly and usually on one side of the body. ? S - Speech. Signs are sudden trouble speaking, slurred speech, or trouble understanding what people say. ? T - Time. Time to call emergency services. Write down what time symptoms started.  Other signs of a stroke, such as: ? A sudden, severe headache with no known cause. ? Nausea or vomiting. ? Seizure. These symptoms may represent a serious problem that is an emergency. Do not wait to see if the symptoms will go away. Get medical help right away. Call your local emergency services (911 in the [place] Do not drive yourself to the hospital. Summary  Atrial fibrillation is a type of irregular or rapid heartbeat  (arrhythmia).  Symptoms include a feeling that your heart is beating fast or irregularly. In some cases, you may not have symptoms.  The condition is treated with medicines to slow down the heart rate or bring the heart's rhythm back to normal. You may also need blood-thinning medicines to prevent blood clots.  Get help right away if you have symptoms or signs of a stroke. This information is not intended to replace advice given to you by your health care provider. Make sure you discuss any questions you have with your health care provider. Document Released: 01/06/2005 Document Revised: 02/27/2017 Document Reviewed: 02/27/2017 Elsevier Interactive Patient Education  2019 Elsevier Inc.  

## 2018-03-03 NOTE — Telephone Encounter (Signed)
[employer] medicare order sent to [facility]. No auth they will reach out to the pt to schedule.  °

## 2018-03-08 ENCOUNTER — Ambulatory Visit: Payer: Medicare Other | Admitting: *Deleted

## 2018-03-08 DIAGNOSIS — I4891 Unspecified atrial fibrillation: Secondary | ICD-10-CM

## 2018-03-08 DIAGNOSIS — Z7901 Long term (current) use of anticoagulants: Secondary | ICD-10-CM | POA: Diagnosis not present

## 2018-03-08 LAB — POCT INR: INR: 1.1 — AB (ref 2.0–3.0)

## 2018-03-08 NOTE — Patient Instructions (Signed)
Description   Today and tomorrow take 2 tablets, then start taking 1 tablet daily except 1.5 tablets on Mondays, Wednesdays and Fridays. Recheck in on Friday. Call us with any medication changes or concerns # [phone].

## 2018-03-11 ENCOUNTER — Other Ambulatory Visit: Payer: Self-pay

## 2018-03-11 ENCOUNTER — Encounter: Payer: Self-pay | Admitting: Cardiology

## 2018-03-11 ENCOUNTER — Ambulatory Visit: Payer: Medicare Other | Admitting: Cardiology

## 2018-03-11 ENCOUNTER — Ambulatory Visit (INDEPENDENT_AMBULATORY_CARE_PROVIDER_SITE_OTHER): Payer: Medicare Other | Admitting: Pharmacist Clinician (PhC)/ Clinical Pharmacy Specialist

## 2018-03-11 DIAGNOSIS — I428 Other cardiomyopathies: Secondary | ICD-10-CM

## 2018-03-11 DIAGNOSIS — IMO0001 Reserved for inherently not codable concepts without codable children: Secondary | ICD-10-CM | POA: Insufficient documentation

## 2018-03-11 DIAGNOSIS — I5043 Acute on chronic combined systolic (congestive) and diastolic (congestive) heart failure: Secondary | ICD-10-CM

## 2018-03-11 DIAGNOSIS — Z7901 Long term (current) use of anticoagulants: Secondary | ICD-10-CM | POA: Diagnosis not present

## 2018-03-11 DIAGNOSIS — I5042 Chronic combined systolic (congestive) and diastolic (congestive) heart failure: Secondary | ICD-10-CM | POA: Insufficient documentation

## 2018-03-11 DIAGNOSIS — Z0389 Encounter for observation for other suspected diseases and conditions ruled out: Secondary | ICD-10-CM | POA: Diagnosis not present

## 2018-03-11 DIAGNOSIS — I10 Essential (primary) hypertension: Secondary | ICD-10-CM

## 2018-03-11 DIAGNOSIS — I4891 Unspecified atrial fibrillation: Secondary | ICD-10-CM

## 2018-03-11 LAB — BASIC METABOLIC PANEL
BUN/Creatinine Ratio: 28 (ref 12–28)
BUN: 26 mg/dL (ref 8–27)
CO2: 29 mmol/L (ref 20–29)
Calcium: 9.8 mg/dL (ref 8.7–10.3)
Chloride: 99 mmol/L (ref 96–106)
Creatinine, Ser: 0.92 mg/dL (ref 0.57–1.00)
GFR calc Af Amer: 68 mL/min/{1.73_m2} (ref >59)
GFR calc non Af Amer: 59 mL/min/{1.73_m2} — ABNORMAL LOW (ref >59)
Glucose: 82 mg/dL (ref 65–99)
Potassium: 4.7 mmol/L (ref 3.5–5.2)
Sodium: 142 mmol/L (ref 134–144)

## 2018-03-11 LAB — POCT INR: INR: 1.2 — AB (ref 2.0–3.0)

## 2018-03-11 MED ORDER — LISINOPRIL 2.5 MG PO TABS: 2.5000 mg | ORAL_TABLET | Freq: Every day | ORAL | 6 refills | Status: DC

## 2018-03-11 MED ORDER — FUROSEMIDE 40 MG PO TABS: ORAL_TABLET | ORAL | 6 refills | Status: DC

## 2018-03-11 MED ORDER — WARFARIN SODIUM 5 MG PO TABS: ORAL_TABLET | ORAL | 1 refills | Status: DC

## 2018-03-11 NOTE — Assessment & Plan Note (Signed)
New onset 02/17/2018- TEE DCCV 02/24/2018

## 2018-03-11 NOTE — Patient Instructions (Addendum)
Medication Instructions:  STOP Aspirin  TAKE Lasix on Mon, Wed, and Fri ONLY Take Lisinopril every day at night  If you need a refill on your cardiac medications before your next appointment, please call your pharmacy.   Lab work: Your physician recommends that you return for lab work in: TODAY-BMET  If you have labs (blood work) drawn today and your tests are completely normal, you will receive your results only by: Marland Kitchen MyChart Message (if you have MyChart) OR . A paper copy in the mail If you have any lab test that is abnormal or we need to change your treatment, we will call you to review the results.  Testing/Procedures: None   Follow-Up: At [facility], you and your health needs are our priority.  As part of our continuing mission to provide you with exceptional heart care, we have created designated Provider Care Teams.  These Care Teams include your primary Cardiologist (physician) and Advanced Practice Providers (APPs -  Physician Assistants and Nurse Practitioners) who all work together to provide you with the care you need, when you need it.  . Your physician recommends that you schedule a follow-up appointment in: 6 weeks with Dr Ellyn Hack or Kerin Ransom, PA-C   Any Other Special Instructions Will Be Listed Below (If Applicable).

## 2018-03-11 NOTE — Assessment & Plan Note (Signed)
Currently on Coumadin till stable off Tegratol and on Keppra-plan to transition to NOAC in 5-6 months

## 2018-03-11 NOTE — Addendum Note (Signed)
Addended by: Ulice Brilliant T on: 03/11/2018 09:54 AM   Modules accepted: Orders

## 2018-03-11 NOTE — Assessment & Plan Note (Signed)
EF 35-40% by echo

## 2018-03-11 NOTE — Progress Notes (Signed)
03/11/2018 Maureen [name]   1940/04/24  [id number]  Primary Physician Myrtis Ser, CNM Primary Cardiologist: Dr Ellyn Hack  HPI: Patient is a pleasant 78 year old female who was referred to the ED after she presented to her PCP with complaints of 48 hours of orthopnea and tachycardia.  She was seen by Dr. Ellyn Hack on admission.  Patient was in atrial fibrillation with rapid ventricular response and congestive failure.  She was initially put on rate control medications and anticoagulation.  She has a history of seizures and had been on Tegretol which precluded the use of NOACs.  The patient's rate was difficult to control.  Echocardiogram revealed an ejection fraction of 35 to 40% with a severely dilated left atrium.  It was decided to proceed with diagnostic catheterization, this was done February 3 and revealed normal coronaries.  Patient underwent TEE cardioversion on February 5.  She has been maintaining sinus rhythm per her history.  The plan was to discharge her on Coumadin anticoagulation.  Eventually she could be transitioned to an NOAC.  She is in the office for follow-up today.  She is done well since discharge.  She admits she is fatigued at times.  She denies any orthopnea.  She is on Coumadin and had her INR checked today.  After evaluation by our pharmacist in the office was recommended that we keep her on Coumadin for about 6 months to make sure she is stable on Keppra and off Tegretol.  She admitted to me that she feels some of this was brought on by stress at home, apparently her husband has dementia and this is been a great strain on her.   Current Outpatient Medications  Medication Sig Dispense Refill  . aspirin EC 81 MG tablet Take 81 mg by mouth daily.    . calcium-vitamin D (OSCAL WITH D) 500-200 MG-UNIT per tablet Take 1 tablet by mouth 2 (two) times daily.     . cholecalciferol (VITAMIN D) 1000 UNITS tablet Take 1,000 Units by mouth daily.    . furosemide (LASIX) 40 MG  tablet Take 1 tablet (40 mg total) by mouth daily. 30 tablet 6  . levETIRAcetam (KEPPRA) 500 MG tablet Take 1 tablet (500 mg total) by mouth 2 (two) times daily. 180 tablet 3  . lisinopril (PRINIVIL,ZESTRIL) 2.5 MG tablet Take 1 tablet (2.5 mg total) by mouth daily. 30 tablet 6  . metoprolol succinate (TOPROL-XL) 100 MG 24 hr tablet Take 1 tablet (100 mg total) by mouth daily. Take with or immediately following a meal. 30 tablet 6  . Multiple Vitamin (MULTIVITAMIN WITH MINERALS) TABS tablet Take 1 tablet by mouth daily.    . pravastatin (PRAVACHOL) 40 MG tablet Take 40 mg by mouth daily.     Marland Kitchen PROAIR HFA 108 (90 Base) MCG/ACT inhaler Inhale 2 puffs into the lungs every 4 (four) hours as needed.  0  . warfarin (COUMADIN) 5 MG tablet Take 1-2 tablets by mouth daily as directed by coumadin clinic 150 tablet 1   No current facility-administered medications for this visit.     Allergies  Allergen Reactions  . Dilantin [Phenytoin Sodium Extended]      Stupor, ataxia.   Marland Kitchen Hydrocodone Nausea And Vomiting    Past Medical History:  Diagnosis Date  . Atrial fibrillation ([place])   . Benign tumor of meninges ([place])   . GERD (gastroesophageal reflux disease)    occ. in past  . Headache(784.0)   . HTN (hypertension), benign  pt. states no hypertension  . Pneumonia    ? as child  . PONV (postoperative nausea and vomiting)   . Seizures ([place])    positive for HBP    Social History   Socioeconomic History  . Marital status: Married    Spouse name: Jan  . Number of children: 2  . Years of education: Master's  . Highest education level: Not on file  Occupational History  . Occupation: retired    Comment: [employer] in [place]: [place]  . Financial resource strain: Not on file  . Food insecurity:    Worry: Not on file    Inability: Not on file  . Transportation needs:    Medical: Not on file    Non-medical: Not on file  Tobacco Use  . Smoking  status: Never Smoker  . Smokeless tobacco: Never Used  Substance and Sexual Activity  . Alcohol use: Yes    Alcohol/week: 0.0 standard drinks    Comment: once monthly  . Drug use: No  . Sexual activity: Not on file  Lifestyle  . Physical activity:    Days per week: Not on file    Minutes per session: Not on file  . Stress: Not on file  Relationships  . Social connections:    Talks on phone: Not on file    Gets together: Not on file    Attends religious service: Not on file    Active member of club or organization: Not on file    Attends meetings of clubs or organizations: Not on file    Relationship status: Not on file  . Intimate partner violence:    Fear of current or ex partner: Not on file    Emotionally abused: Not on file    Physically abused: Not on file    Forced sexual activity: Not on file  Other Topics Concern  . Not on file  Social History Narrative   She retired in 1995 from [employer]. Lives at home with her husband, Jan. They have a son age 67 and a  son age 67. Cafffeine once daily. a week and alcohol once a month. Denies tobacco and illicit drug use.    Patient is left handed.   Patient has a Master's degree.     Family History  Problem Relation Age of Onset  . Dementia Father        lewy body dementia  . Heart disease Other        PACEMAKER DEFIB.       Review of Systems: General: negative for chills, fever, night sweats or weight changes.  Cardiovascular: negative for chest pain, dyspnea on exertion, edema, orthopnea, palpitations, paroxysmal nocturnal dyspnea or shortness of breath Dermatological: negative for rash Respiratory: negative for cough or wheezing Urologic: negative for hematuria Abdominal: negative for nausea, vomiting, diarrhea, bright red blood per rectum, melena, or hematemesis Neurologic: negative for visual changes, syncope, or dizziness All other systems reviewed and are otherwise negative except as noted  above.    Blood pressure 102/64, pulse (!) 59, height 5' 1.5" (1.562 m), weight 147 lb (66.7 kg), SpO2 97 %.  General appearance: alert, cooperative and no distress Neck: no carotid bruit and no JVD Lungs: clear to auscultation bilaterally Heart: regular rate and rhythm Extremities: no edema Skin: Skin color, texture, turgor normal. No rashes or lesions Neurologic: Grossly normal  EKG NSR-SB 59, LVH by volts, poor anterior RW  ASSESSMENT  AND PLAN:   Atrial fibrillation with RVR ([place]) New onset 02/17/2018- TEE DCCV 02/24/2018  Acute on chronic combined systolic and diastolic CHF (congestive heart failure) ([place]) New onset 02/17/2018- TEE DCCV 02/24/2018  Normal coronary arteries Cath 02/22/2018  NICM (nonischemic cardiomyopathy) (H[facility]) EF 35-40% by echo  Seizure disorder, focal motor ([place]) Pt was on Tegretol prior to admission precluding the use of a NOAC  Long term (current) use of anticoagulants Currently on Coumadin till stable off Tegratol and on Keppra-plan to transition to NOAC in 5-6 months  HTN (hypertension), benign Controlled   PLAN I suggested she stop her baby aspirin.  Her blood pressure is borderline low.  I suggested she take her lisinopril nightly.  I also said we could probably cut her Lasix back to Monday Wednesday and Friday.  I will check a BMP today.  She has had some fatigue and I consider changing her Toprol possibly to carvedilol but at this time she seems to be stable and she is not interested in changing her medications at this time.  We will see her back in a few weeks.  At that time we may consider changing to another beta-blocker that may cause her less fatigue.  She will need an echocardiogram in 3 months.  Kerin Ransom PA-C 03/11/2018 9:40 AM

## 2018-03-11 NOTE — Assessment & Plan Note (Signed)
Controlled.  

## 2018-03-11 NOTE — Assessment & Plan Note (Signed)
Pt was on Tegretol prior to admission precluding the use of a NOAC

## 2018-03-11 NOTE — Assessment & Plan Note (Signed)
Cath 02/22/2018

## 2018-03-17 ENCOUNTER — Ambulatory Visit: Payer: Medicare Other | Admitting: Neurology

## 2018-03-17 ENCOUNTER — Ambulatory Visit (INDEPENDENT_AMBULATORY_CARE_PROVIDER_SITE_OTHER): Payer: Medicare Other | Admitting: Pharmacist Clinician (PhC)/ Clinical Pharmacy Specialist

## 2018-03-17 DIAGNOSIS — G40109 Localization-related (focal) (partial) symptomatic epilepsy and epileptic syndromes with simple partial seizures, not intractable, without status epilepticus: Secondary | ICD-10-CM

## 2018-03-17 DIAGNOSIS — Z9889 Other specified postprocedural states: Secondary | ICD-10-CM

## 2018-03-17 DIAGNOSIS — D329 Benign neoplasm of meninges, unspecified: Secondary | ICD-10-CM

## 2018-03-17 DIAGNOSIS — I4891 Unspecified atrial fibrillation: Secondary | ICD-10-CM

## 2018-03-17 DIAGNOSIS — Z7901 Long term (current) use of anticoagulants: Secondary | ICD-10-CM

## 2018-03-17 DIAGNOSIS — G4733 Obstructive sleep apnea (adult) (pediatric): Secondary | ICD-10-CM

## 2018-03-17 DIAGNOSIS — D332 Benign neoplasm of brain, unspecified: Secondary | ICD-10-CM

## 2018-03-17 DIAGNOSIS — Z5181 Encounter for therapeutic drug level monitoring: Secondary | ICD-10-CM

## 2018-03-17 LAB — POCT INR: INR: 1.3 — AB (ref 2.0–3.0)

## 2018-03-24 ENCOUNTER — Ambulatory Visit (INDEPENDENT_AMBULATORY_CARE_PROVIDER_SITE_OTHER): Payer: Medicare Other | Admitting: Pharmacist Clinician (PhC)/ Clinical Pharmacy Specialist

## 2018-03-24 DIAGNOSIS — Z7901 Long term (current) use of anticoagulants: Secondary | ICD-10-CM | POA: Diagnosis not present

## 2018-03-24 DIAGNOSIS — I4891 Unspecified atrial fibrillation: Secondary | ICD-10-CM

## 2018-03-24 LAB — POCT INR: INR: 1.3 — AB (ref 2.0–3.0)

## 2018-03-24 NOTE — Patient Instructions (Signed)
Take 2 tablets Wednesday March 4 and Thursday March 5, then take 1.5 tablets daily except 2 tablets on Sundays and Wednesdays.  Repeat INR Monday.  Use enoxaparin 100 mg once daily for the next 4 days.

## 2018-03-26 DIAGNOSIS — G4733 Obstructive sleep apnea (adult) (pediatric): Secondary | ICD-10-CM | POA: Insufficient documentation

## 2018-03-26 NOTE — Addendum Note (Signed)
Addended by: Larey Seat on: 03/26/2018 03:45 PM   Modules accepted: Orders

## 2018-03-26 NOTE — Procedures (Signed)
[facility] [address] [place], [place] [address] NAME:   Maureen [name]                                                                         DOB: 19-Nov-1940 MEDICAL RECORD No: [id number]                                                DOS: 03/17/2018 REFERRING PHYSICIAN: Serita Grammes, CNM STUDY PERFORMED: [facility] on [employer] HISTORY: 03-02-2018;  [name] is an 78 year old female with a history of seizures and meningiomata. This patient is a left-handed Caucasian married female with a history of a left craniotomy for a left parasagittal meningioma resection on 01/04/1991.  Recent admission to hospital (02-22-2018) for new onset atrial fibrillation, followed by echocardiogram and cardioversion. Seizure-Medications were changed due to need for anticoagulation, and the patient placed on Keppra, Leviracetam. She was weaned off Carbamazepine- completely.  She was only during her hospital stay placed on amiodarone, was not to be continued after successful cardioversion. She remains on TOPROL, feels tired.   She is here for her routine yearly seizure follow up, but needs a sleep study for atrial fib risk work up. Atrial hypertrophy. She remains hoarse since intubation.    STUDY RESULTS:  Total Recording Time: 9 h 20 mins; Calculated Sleep Time: 7 h 30 mins. Total Apnea/Hypopnea Index (AHI): 20.0/h; RDI: 20.8 /h; REM AHI: 48.4 /h Average Oxygen Saturation: 95%; Lowest Oxygen Desaturation: 81%.  Total Time in Oxygen Saturation below 89 %: 6.8 minutes.  Average Heart Rate:  54 bpm (between 43 and 76 bpm). IMPRESSION: 1) Moderate degree of OSA (Obstructive Sleep Apnea). 2) REM dependent apnea with bradycardic responses, no evidence of prolonged hypoxemia.  RECOMMENDATION: REM dependent AHI of 48.4/h cannot be treated with a dental device. This patient will need CPAP therapy.  If possible by insurance qualification, I like for this patient to undergo in lab titration. If  not , will order an autotitration CP{AP device , heated humidity, mask of patient's choice and a pressure window form 5-16 cm water with 3 cm EPR.  I certify that I have reviewed the raw data recording prior to the issuance of this report in accordance with the standards of the American Academy of Sleep Medicine (AASM). Larey Seat, M.D.    03-26-2018   Medical Director of [facility] at [facility], accredited by the AASM. Diplomat of the ABPN and ABSM.

## 2018-03-29 ENCOUNTER — Ambulatory Visit (INDEPENDENT_AMBULATORY_CARE_PROVIDER_SITE_OTHER): Payer: Medicare Other | Admitting: *Deleted

## 2018-03-29 ENCOUNTER — Telehealth: Payer: Self-pay | Admitting: Neurology

## 2018-03-29 DIAGNOSIS — Z7901 Long term (current) use of anticoagulants: Secondary | ICD-10-CM | POA: Diagnosis not present

## 2018-03-29 DIAGNOSIS — I4891 Unspecified atrial fibrillation: Secondary | ICD-10-CM

## 2018-03-29 LAB — POCT INR: INR: 1.4 — AB (ref 2.0–3.0)

## 2018-03-29 NOTE — Patient Instructions (Signed)
Description   Today take 12.5mg  then start taking 10mg  daily.  Take last enoxaparin/lovenox 100 mg injection tonight per [place]. Recheck on Thursday.

## 2018-03-29 NOTE — Telephone Encounter (Signed)
-----   Message from Larey Seat, MD sent at 03/26/2018  3:45 PM EST ----- IMPRESSION: 1) Moderate degree of OSA (Obstructive Sleep Apnea).  2) REM dependent apnea with bradycardic responses, no evidence of  prolonged hypoxemia.  RECOMMENDATION: REM dependent AHI of 48.4/h cannot be treated  with a dental device. This patient will need CPAP therapy.  If possible by insurance qualification, I like for this patient  to undergo in lab titration.  If not , will order an autotitration  CP[AP device , heated humidity, mask of patient's choice and a  pressure window form 5-16 cm water with 3 cm EPR.

## 2018-03-29 NOTE — Telephone Encounter (Signed)
Pt returned call and I was able to review the test with the patient and reviewed the numbers that were found on the study. Advised the patient that Dr Brett Fairy would recommend the patient come in for titration study. Educated on what the cpap titration study was and what a auto titration cpap was. Pt states that she will contact us when/if she wants to complete the study. I will make the sleep lab aware that the patient doesn't know when she will be able to schedule the study as the next few months are busy per the patient. Pt verbalized understanding of the sleep study results and had no questions.

## 2018-03-29 NOTE — Telephone Encounter (Signed)
Called patient to discuss sleep study results. No answer at this time. LVM for the patient to call back.   

## 2018-03-29 NOTE — Telephone Encounter (Signed)
Noted, thanks!

## 2018-04-01 ENCOUNTER — Other Ambulatory Visit: Payer: Self-pay

## 2018-04-01 ENCOUNTER — Ambulatory Visit (INDEPENDENT_AMBULATORY_CARE_PROVIDER_SITE_OTHER): Payer: Medicare Other | Admitting: Pharmacist Clinician (PhC)/ Clinical Pharmacy Specialist

## 2018-04-01 DIAGNOSIS — Z7901 Long term (current) use of anticoagulants: Secondary | ICD-10-CM | POA: Diagnosis not present

## 2018-04-01 DIAGNOSIS — I4891 Unspecified atrial fibrillation: Secondary | ICD-10-CM | POA: Diagnosis not present

## 2018-04-01 LAB — POCT INR: INR: 1.6 — AB (ref 2.0–3.0)

## 2018-04-05 ENCOUNTER — Ambulatory Visit (INDEPENDENT_AMBULATORY_CARE_PROVIDER_SITE_OTHER): Payer: Medicare Other | Admitting: *Deleted

## 2018-04-05 ENCOUNTER — Other Ambulatory Visit: Payer: Self-pay

## 2018-04-05 ENCOUNTER — Ambulatory Visit
Admission: RE | Admit: 2018-04-05 | Discharge: 2018-04-05 | Disposition: A | Discharge status: Home / Self Care | Payer: Medicare Other | Source: Ambulatory Visit | Attending: Neurology | Admitting: Neurology

## 2018-04-05 DIAGNOSIS — I4891 Unspecified atrial fibrillation: Secondary | ICD-10-CM | POA: Diagnosis not present

## 2018-04-05 DIAGNOSIS — D329 Benign neoplasm of meninges, unspecified: Secondary | ICD-10-CM

## 2018-04-05 DIAGNOSIS — Z7901 Long term (current) use of anticoagulants: Secondary | ICD-10-CM

## 2018-04-05 DIAGNOSIS — G40109 Localization-related (focal) (partial) symptomatic epilepsy and epileptic syndromes with simple partial seizures, not intractable, without status epilepticus: Secondary | ICD-10-CM

## 2018-04-05 LAB — POCT INR: INR: 1.7 — AB (ref 2.0–3.0)

## 2018-04-05 MED ORDER — GADOBENATE DIMEGLUMINE 529 MG/ML IV SOLN
13.0000 mL | Freq: Once | INTRAVENOUS | Status: AC | PRN
  Administered 2018-04-05: 13 mL via INTRAVENOUS

## 2018-04-05 NOTE — Patient Instructions (Signed)
Description   Change your dose to 2 tablets daily except 2.5 tablets on Mondays, Wednesdays and Fridays. Recheck in one week.

## 2018-04-07 ENCOUNTER — Telehealth: Payer: Self-pay | Admitting: Neurology

## 2018-04-07 NOTE — Telephone Encounter (Signed)
Called and reviewed with the patient her MRI results. Informed her of the findings as well as the new area in the right frontal area. Advised I would mail a copy to her address and confirmed that it was on file correctly. Pt verbalized understanding. Pt had no questions at this time but was encouraged to call back if questions arise. Advised we will monitor yearly with MRI's

## 2018-04-07 NOTE — Telephone Encounter (Signed)
-----   Message from Larey Seat, MD sent at 04/07/2018  8:57 AM EDT ----- IMPRESSION: This MRI of the brain with and without contrast shows the following: 1.    Stable appearance of gliosis adjacent to left parietal craniotomy. 2.    There are 3 subcentimeter meningiomas.  The right frontal parafalcine and left frontal meningiomas are mildly enlarged compared to the 01/29/2015 MRI.  Since the previous MRI, there is a new appearance of a right frontal meningioma. 3.    Thrombus in the distal superior sagittal sinus that appears stable and is nonobstructive.  We will follow yearly- with MRIs .  Meningeomas can cause seizures if they compress on the brain cortex.

## 2018-04-19 ENCOUNTER — Telehealth: Payer: Self-pay

## 2018-04-19 NOTE — Telephone Encounter (Signed)
LMOM FOR PRESCREEN/DRIVE THRU 

## 2018-04-19 NOTE — Telephone Encounter (Signed)

## 2018-04-20 ENCOUNTER — Ambulatory Visit (INDEPENDENT_AMBULATORY_CARE_PROVIDER_SITE_OTHER): Payer: Medicare Other | Admitting: Pharmacist

## 2018-04-20 ENCOUNTER — Other Ambulatory Visit: Payer: Self-pay

## 2018-04-20 DIAGNOSIS — I4891 Unspecified atrial fibrillation: Secondary | ICD-10-CM | POA: Diagnosis not present

## 2018-04-20 DIAGNOSIS — Z7901 Long term (current) use of anticoagulants: Secondary | ICD-10-CM

## 2018-04-20 LAB — POCT INR: INR: 4.6 — AB (ref 2.0–3.0)

## 2018-04-28 ENCOUNTER — Telehealth: Payer: Self-pay | Admitting: *Deleted

## 2018-04-28 NOTE — Telephone Encounter (Signed)
Left message to cal back - need schedule e visit   ( can be telephone) For 4/13 with dr harding. ( ask if patient has smartphone)   if not able to do e-visit  Reschedule Priority 1

## 2018-04-29 ENCOUNTER — Telehealth: Payer: Self-pay | Admitting: Cardiology

## 2018-04-29 NOTE — Telephone Encounter (Signed)
Smart phone/mychart/04.09/2018/dc

## 2018-05-03 ENCOUNTER — Ambulatory Visit: Payer: Medicare Other | Admitting: Cardiology

## 2018-05-03 ENCOUNTER — Telehealth (INDEPENDENT_AMBULATORY_CARE_PROVIDER_SITE_OTHER): Payer: Medicare Other | Admitting: Cardiology

## 2018-05-03 ENCOUNTER — Encounter: Payer: Self-pay | Admitting: Cardiology

## 2018-05-03 VITALS — BP 173/83 | Ht 61.0 in | Wt 145.0 lb

## 2018-05-03 DIAGNOSIS — Z7901 Long term (current) use of anticoagulants: Secondary | ICD-10-CM

## 2018-05-03 DIAGNOSIS — I10 Essential (primary) hypertension: Secondary | ICD-10-CM

## 2018-05-03 DIAGNOSIS — I5042 Chronic combined systolic (congestive) and diastolic (congestive) heart failure: Secondary | ICD-10-CM | POA: Diagnosis not present

## 2018-05-03 DIAGNOSIS — I480 Paroxysmal atrial fibrillation: Secondary | ICD-10-CM | POA: Diagnosis not present

## 2018-05-03 DIAGNOSIS — Z0389 Encounter for observation for other suspected diseases and conditions ruled out: Secondary | ICD-10-CM

## 2018-05-03 DIAGNOSIS — IMO0001 Reserved for inherently not codable concepts without codable children: Secondary | ICD-10-CM

## 2018-05-03 DIAGNOSIS — I428 Other cardiomyopathies: Secondary | ICD-10-CM

## 2018-05-03 DIAGNOSIS — R569 Unspecified convulsions: Secondary | ICD-10-CM

## 2018-05-03 NOTE — Assessment & Plan Note (Signed)
Reduced EF by echocardiogram with acute exacerbation of in the setting of A. fib.  At least the diastolic component was acute on chronic.  The reduced EF is of unknown duration.  Currently seems euvolemic based on symptoms.  Explained the importance of ACE-I / Beta Blocker combination as Rx for her Cardiomyopathy/CHF as opposed to HTN.   The fact that her BP was elevated today dose also suggest that we have not over-done it.  Not sure of etiology for reduced EF - Tachycardia mediated vs. Viral (had prolonged viral illness in the fall).  Plan -   ontinue low dose ACE-I + Toprol --adjust dose of lisinopril based on blood pressure  2-3 d/week Lasix (& as needed based upon sliding scale).   We will recheck a 2D echocardiogram in roughly June timeframe.

## 2018-05-03 NOTE — Assessment & Plan Note (Signed)
Surprisingly normal coronary arteries by catheterization.  This is reassuring given her cardiomyopathy. Would suggest nonischemic cardiomyopathy.  On beta-blocker, ACE inhibitor, statin

## 2018-05-03 NOTE — Assessment & Plan Note (Signed)
Blood pressure has been controlled on seen by with cuboid.  Was high today partially because of her being somewhat stressed.  This does tell me that we do have room to continue with her medications.  For now continue with ACE inhibitor and beta-blocker at current dose, but low threshold for titrating up lisinopril. As her energy level has improved, we will continue with Toprol for better rate control thancarvedilol.

## 2018-05-03 NOTE — Assessment & Plan Note (Signed)
History of seizures, formally on Tegretol.  It is her strong desire not to be on warfarin.    Given future plans to convert to [place], we had to wean her off of the Tegretol, and started Keppra per neurology consultation recommendations. Plan is to ensure that she is stable on the Keppra and allow time for Tegretol to be out of her system prior to starting Eliquis. -- We are covering with warfarin for now.

## 2018-05-03 NOTE — Patient Instructions (Addendum)
Medication Instructions:  --Plan is to convert from warfarin to Eliquis in roughly August-September timeframe.  This is to allow time for Tegretol to be out of your system and ensure that you are doing fine on the [place].  --Lisinopril is being used more for a heart failure (low pump function) medicine as opposed to simply blood pressure as is the Toprol which is therefore heart rate for atrial fibrillation as well as for heart failure.  If you need a refill on your cardiac medications before your next appointment, please call your pharmacy.    Lab work:  Will check CBC on the day of your Follow up  in Aug-Sept 2020  If you have labs (blood work) drawn today and your tests are completely normal, you will receive your results only by: Marland Kitchen MyChart Message (if you have MyChart) OR . A paper copy in the mail If you have any lab test that is abnormal or we need to change your treatment, we will call you to review the results.  Testing/Procedures:  will schedule  2D Echo in ~June 2020 - at [place] has requested that you have an echocardiogram. Echocardiography is a painless test that uses sound waves to create images of your heart. It provides your doctor with information about the size and shape of your heart and how well your heart's chambers and valves are working. This procedure takes approximately one hour. There are no restrictions for this procedure.   Follow-Up: At [facility], you and your health needs are our priority.  As part of our continuing mission to provide you with exceptional heart care, we have created designated Provider Care Teams.  These Care Teams include your primary Cardiologist (physician) and Advanced Practice Providers (APPs -  Physician Assistants and Nurse Practitioners) who all work together to provide you with the care you need, when you need it. You will need a follow up appointment in  Aug - Sept. 2020.  Please call our  office 2 months in advance to schedule this appointment.  You may see Glenetta Hew, MD  or one of the following Advanced Practice Providers on your designated Care Team:   Rosaria Ferries, PA-C . Jory Sims, DNP, ANP  Any Other Special Instructions Will Be Listed Below (If Applicable).

## 2018-05-03 NOTE — Assessment & Plan Note (Signed)
Plan is to continue warfarin until August September timeframe at which time we will convert to Eliquis.  Currently on warfarin with labs checked by our [facility] Coumadin clinic

## 2018-05-03 NOTE — Assessment & Plan Note (Signed)
New onset as of January of this year.  Status post TEE cardioversion and maintaining sinus rhythm.  On beta-blocker for rate control.  Not on antiarrhythmic.   Currently on warfarin with plans to change to Eliquis after 5 to 6 weeks to allow time to be off of Tegretol and stable on Keppra.

## 2018-05-03 NOTE — Progress Notes (Signed)
Virtual Visit via Telephone Note   This visit type was conducted due to national recommendations for restrictions regarding the COVID-19 Pandemic (e.g. social distancing) in an effort to limit this patient's exposure and mitigate transmission in our community.  Due to her co-morbid illnesses, this patient is at least at moderate risk for complications without adequate follow up.  This format is felt to be most appropriate for this patient at this time.  The patient did not have access to video technology/had technical difficulties with video requiring transitioning to audio format only (telephone).  All issues noted in this document were discussed and addressed.  No physical exam could be performed with this format.  Please refer to the patient's chart for her  consent to telehealth for [facility].   Patient has given verbal permission to conduct this visit via virtual appointment and to bill insurance 05/03/2018 3:20PM      Evaluation Performed:  Follow-up visit  Date:  05/03/2018   ID:  SELENY ALLBRIGHT, DOB [birth year], MRN [id number]  Patient Location: Home  Provider Location: Home  PCP:  Mayra Neer, MD  Cardiologist:  Glenetta Hew, MD  Electrophysiologist:  None   Chief Complaint: 6-8-week follow-up for chronic combined systolic and diastolic heart failure and paroxysmal atrial fibrillation  History of Present Illness:    Maureen [name] is a 78 y.o. female who presents via audio/video conferencing for a telehealth visit today.   --Maureen [name] was initially seen in hospital consultation (late January-early February) for acute on chronic combined systolic and diastolic heart failure with atrial fibrillation RVR exacerbating her heart failure. -->  She felt that this episode may have been brought on by stress related to taking care of her husband who is dealing with dementia. --> Echocardiogram showed EF 35 to 40% with severe dilated left atrium.  This was found to be nonischemic  cardiomyopathy with cardiac catheterization. --> She eventually had TEE cardioversion on February 5, and was treated with warfarin until she was able to be weaned fully off of Tegretol and converted to [place].  The plan was to convert to [place] after roughly 6 months of warfarin in order to assure that she is stable on Keppra and off of Tegretol.  She was seen in hospital follow-up in late February by Kerin Ransom, PA. --> Maintaining sinus rhythm.  No active heart failure symptoms.  No bleeding issues on warfarin.  No recurrent A. Fib. -> Stopped baby aspirin.  Cut Lasix back to 3 times a week. --> With concern for fatigue, consider switching from Toprol to carvedilol -> Plan follow-up echo in 3 months  Interval History:   Concerned re: BP meds --was not sure why she was on 2 blood pressure medicine & Warfarin -was on the impression that she was switching to Eliquis.  I am talking to [facility] today after she is come back from the beach (a little bit stressed after long drive and therefore blood pressure is high.  Otherwise she is been doing fine from a cardiac standpoint.  Maybe a little bit more edema and while down at the beach for the last week then she has had usually (potentially related to drinking more fluids and may be having more salt in her diet).  Despite this, her weight is stayed the same.  No PND orthopnea. She has not had any further episodes of rapid irregular heartbeats or palpitations.  No dyspnea with rest or exertion and her energy level has improved since being out of  the hospital.  She does have some cough days, but for the most part is doing well.     Cardiovascular ROS: no chest pain or dyspnea on exertion positive for - edema and Some days with less energy than others negative for - irregular heartbeat, orthopnea, palpitations, paroxysmal nocturnal dyspnea, rapid heart rate, shortness of breath or Syncope/near syncope, TIA shows amaurosis fugax.  Melena, hematochezia, hematuria,  epistaxis.  Easy bruising or bleeding  The patient does not have symptoms concerning for COVID-19 infection (fever, chills, cough, or new shortness of breath).   Review of Systems  Constitutional: Negative for chills, fever, malaise/fatigue (E better) and weight loss.  HENT: Negative for congestion and nosebleeds.   Respiratory: Negative for cough, sputum production and wheezing.   Gastrointestinal: Negative for constipation and heartburn.  Genitourinary: Negative for dysuria.  Musculoskeletal: Negative for joint pain.  Neurological: Negative for dizziness.  Endo/Heme/Allergies: Negative for environmental allergies. Does not bruise/bleed easily.  Psychiatric/Behavioral: Negative for memory loss. The patient is not nervous/anxious and does not have insomnia.   All other systems reviewed and are negative.   Past Medical History:  Diagnosis Date  . Atrial fibrillation ([place])   . Benign tumor of meninges ([place])   . Chronic combined systolic and diastolic CHF, NYHA class 2 and ACA/AHA stage C ([place]) 01/2018   Exacerbated by A. fib; EF 35 to 40% with global HK.  Reduced cardiac output and index (3.2/1.9).   -Associated moderate pulmonary hypertension-(mean PA P 38 mmHg)  . GERD (gastroesophageal reflux disease)    occ. in past  . Headache(784.0)   . HTN (hypertension), benign    pt. states no hypertension  . NICM (nonischemic cardiomyopathy) ([place]) 01/2018   Echo with EF of 35 to 40%.  Global HK.  Mild to moderate MR.  Mild nonobstructive CAD by cath.  Cardiac output/index 3.2/1.9  . Pneumonia    ? as child  . PONV (postoperative nausea and vomiting)   . Seizures ([place])    positive for HBP   Past Surgical History:  Procedure Laterality Date  . CARDIOVERSION N/A 02/24/2018   Procedure: CARDIOVERSION;  Surgeon: Sueanne Margarita, MD;  Location: [facility] ENDOSCOPY;  Service: Cardiovascular;  Laterality: N/A;  . CATARACT EXTRACTION, BILATERAL Bilateral   . CHOLECYSTECTOMY    . COLONOSCOPY WITH  PROPOFOL N/A 11/30/2012   Procedure: COLONOSCOPY WITH PROPOFOL;  Surgeon: Garlan Fair, MD;  Location: [facility] ENDOSCOPY;  Service: Endoscopy;  Laterality: N/A;  . ESOPHAGOGASTRODUODENOSCOPY (EGD) WITH PROPOFOL N/A 11/30/2012   Procedure: ESOPHAGOGASTRODUODENOSCOPY (EGD) WITH PROPOFOL;  Surgeon: Garlan Fair, MD;  Location: [facility] ENDOSCOPY;  Service: Endoscopy;  Laterality: N/A;  . ESOPHAGOGASTRODUODENOSCOPY (EGD) WITH PROPOFOL N/A 03/14/2015   Procedure: ESOPHAGOGASTRODUODENOSCOPY (EGD) WITH PROPOFOL;  Surgeon: Arta Silence, MD;  Location: [facility] ENDOSCOPY;  Service: Endoscopy;  Laterality: N/A;  . EUS N/A 01/05/2013   Procedure: FULL UPPER ENDOSCOPIC ULTRASOUND (EUS) RADIAL;  Surgeon: Arta Silence, MD;  Location: [facility] ENDOSCOPY;  Service: Endoscopy;  Laterality: N/A;  EUS with possible FNA  . EUS N/A 03/14/2015   Procedure: UPPER ENDOSCOPIC ULTRASOUND (EUS) RADIAL;  Surgeon: Arta Silence, MD;  Location: [facility] ENDOSCOPY;  Service: Endoscopy;  Laterality: N/A;  . EYE SURGERY Left    "few weeks ago scar tissue laser surgery"  . FINE NEEDLE ASPIRATION N/A 01/05/2013   Procedure: FINE NEEDLE ASPIRATION (FNA) RADIAL;  Surgeon: Arta Silence, MD;  Location: [facility] ENDOSCOPY;  Service: Endoscopy;  Laterality: N/A;  . KNEE ARTHROSCOPY Left   . [place]  12/1990  . RIGHT/LEFT HEART CATH AND CORONARY ANGIOGRAPHY N/A 02/22/2018   Procedure: RIGHT/LEFT HEART CATH AND CORONARY ANGIOGRAPHY;  Surgeon: Larey Dresser, MD;  Location: [facility] INVASIVE CV LAB;;  Nonobstructive CAD.  Mild to moderate pulmonary hypertension (PA P 47/24 mmHg-mean 38 mmHg.  PCWP 22 mmHg with LVEDP 24 mmHg. CO/CI = 3.2/1.9  . TEE WITHOUT CARDIOVERSION N/A 02/24/2018   Procedure: TRANSESOPHAGEAL ECHOCARDIOGRAM (TEE) - WITH DCCV;  Surgeon: Sueanne Margarita, MD;  Location: [facility] ENDOSCOPY;; EF 35-40%.  Global HK.  No R WMA. Mild AI, Mild-Mod MR. Severe LA dilation (no LAA thrombus).  Severely reduced RV function.  Mild RA dilation --successful  DCCV  . TRANSTHORACIC ECHOCARDIOGRAM  02/18/2018   EF 35 to 40%.  Unable to assess diastolic function because of A. fib.  No obvious regional wall motion abnormality.  Global hypokinesis.  Severe LA dilation.  Mild-moderate MR.  Mild AI without AS  . VAGINAL HYSTERECTOMY  1983     Current Meds  Medication Sig  . calcium-vitamin D (OSCAL WITH D) 500-200 MG-UNIT per tablet Take 1 tablet by mouth 2 (two) times daily.   . cholecalciferol (VITAMIN D) 1000 UNITS tablet Take 1,000 Units by mouth daily.  . furosemide (LASIX) 40 MG tablet Take 1 tablet on Mon Wed and Fri ONLY  . levETIRAcetam (KEPPRA) 500 MG tablet Take 1 tablet (500 mg total) by mouth 2 (two) times daily.  Marland Kitchen lisinopril (PRINIVIL,ZESTRIL) 2.5 MG tablet Take 1 tablet (2.5 mg total) by mouth at bedtime.  . metoprolol succinate (TOPROL-XL) 100 MG 24 hr tablet Take 1 tablet (100 mg total) by mouth daily. Take with or immediately following a meal.  . Multiple Vitamin (MULTIVITAMIN WITH MINERALS) TABS tablet Take 1 tablet by mouth daily.  . pravastatin (PRAVACHOL) 40 MG tablet Take 40 mg by mouth daily.   Marland Kitchen PROAIR HFA 108 (90 Base) MCG/ACT inhaler Inhale 2 puffs into the lungs every 4 (four) hours as needed.  . warfarin (COUMADIN) 5 MG tablet Take 1-2 tablets by mouth daily as directed by coumadin clinic     Allergies:   Dilantin [phenytoin sodium extended] and Hydrocodone   Social History   Tobacco Use  . Smoking status: Never Smoker  . Smokeless tobacco: Never Used  Substance Use Topics  . Alcohol use: Yes    Alcohol/week: 0.0 standard drinks    Comment: once monthly  . Drug use: No     Family Hx: The patient's family history includes Dementia in her father; Heart disease in an other family member.  Prior CV studies:   The following studies were reviewed today:   ECHO February 18, 2018: EF 35 to 40%.  Unable to assess diastolic function because of A. fib.  No obvious regional wall motion abnormality.  Global hypokinesis.   Severe LA dilation.  Mild-moderate MR.  Mild AI without AS  R&L HEART CATH February 22, 2018: Nonobstructive CAD.  Mild to moderate pulmonary hypertension (PA P 47/24 mmHg-mean 38 mmHg.  PCWP 22 mmHg with LVEDP 24 mmHg. CO/CI = 3.2/1.9  TEE/DCCV: EF 35-40%.  Global HK.  No R WMA. Mild AI, Mild-Mod MR. Severe LA dilation (no LAA thrombus).  Severely reduced RV function.  Mild RA dilation.  Successful DCCV  Labs/Other Tests and Data Reviewed:    EKG:  No ECG reviewed.  Recent Labs: 08/31/2017: ALT 19 02/17/2018: B Natriuretic Peptide 232.7; TSH 3.211 02/23/2018: Magnesium 2.0 02/25/2018: Hemoglobin 11.7; Platelets 273 03/11/2018: BUN 26; Creatinine, Ser 0.92; Potassium 4.7;  Sodium 142   Recent Lipid Panel Lab Results  Component Value Date/Time   CHOL 144 02/18/2018 12:27 AM   TRIG 71 02/18/2018 12:27 AM   HDL 55 02/18/2018 12:27 AM   CHOLHDL 2.6 02/18/2018 12:27 AM   LDLCALC 75 02/18/2018 12:27 AM    Wt Readings from Last 3 Encounters:  05/03/18 145 lb (65.8 kg)  03/11/18 147 lb (66.7 kg)  03/03/18 145 lb (65.8 kg)     Objective:    Vital Signs:  BP (!) 173/83   Ht 5\' 1"  (1.549 m)   Wt 145 lb (65.8 kg)   BMI 27.40 kg/m   BP was high last PM after driving home from beach.  Well nourished, well developed female in no acute distress. No respiratory distress. A&O x 3  ASSESSMENT & PLAN:    Problem List Items Addressed This Visit    Chronic combined systolic and diastolic heart failure (HCC) - Primary (Chronic)    Reduced EF by echocardiogram with acute exacerbation of in the setting of A. fib.  At least the diastolic component was acute on chronic.  The reduced EF is of unknown duration.  Currently seems euvolemic based on symptoms.  Explained the importance of ACE-I / Beta Blocker combination as Rx for her Cardiomyopathy/CHF as opposed to HTN.   The fact that her BP was elevated today dose also suggest that we have not over-done it.  Not sure of etiology for reduced EF -  Tachycardia mediated vs. Viral (had prolonged viral illness in the fall).  Plan -   ontinue low dose ACE-I + Toprol --adjust dose of lisinopril based on blood pressure  2-3 d/week Lasix (& as needed based upon sliding scale).   We will recheck a 2D echocardiogram in roughly June timeframe.      Relevant Orders   ECHOCARDIOGRAM COMPLETE   Essential hypertension (Chronic)    Blood pressure has been controlled on seen by with cuboid.  Was high today partially because of her being somewhat stressed.  This does tell me that we do have room to continue with her medications.  For now continue with ACE inhibitor and beta-blocker at current dose, but low threshold for titrating up lisinopril. As her energy level has improved, we will continue with Toprol for better rate control thancarvedilol.      Long term (current) use of anticoagulants (Chronic)    Plan is to continue warfarin until August September timeframe at which time we will convert to Eliquis.  Currently on warfarin with labs checked by our CV[facility] Coumadin clinic      Relevant Orders   CBC   NICM (nonischemic cardiomyopathy) ([place])   Relevant Orders   ECHOCARDIOGRAM COMPLETE   Normal coronary arteries (Chronic)    Surprisingly normal coronary arteries by catheterization.  This is reassuring given her cardiomyopathy. Would suggest nonischemic cardiomyopathy.  On beta-blocker, ACE inhibitor, statin      PAF (paroxysmal atrial fibrillation) (HCC) (Chronic)    New onset as of January of this year.  Status post TEE cardioversion and maintaining sinus rhythm.  On beta-blocker for rate control.  Not on antiarrhythmic.   Currently on warfarin with plans to change to Eliquis after 5 to 6 weeks to allow time to be off of Tegretol and stable on Keppra.      Relevant Orders   ECHOCARDIOGRAM COMPLETE   CBC   Seizures (HCC) (Chronic)    History of seizures, formally on Tegretol.  It is her strong desire not  to be on warfarin.     Given future plans to convert to [place], we had to wean her off of the Tegretol, and started Keppra per neurology consultation recommendations. Plan is to ensure that she is stable on the Keppra and allow time for Tegretol to be out of her system prior to starting Eliquis. -- We are covering with warfarin for now.       Other Visit Diagnoses    Current use of long term anticoagulation           COVID-19 Education: The signs and symptoms of COVID-19 were discussed with the patient and how to seek care for testing (follow up with PCP or arrange E-visit).  The importance of social distancing was discussed today.  Time:   Today, I have spent 15 minutes with the patient with telehealth technology discussing the above problems.     Medication Adjustments/Labs and Tests Ordered: Current medicines are reviewed at length with the patient today.  Concerns regarding medicines are outlined above.  Tests Ordered: Orders Placed This Encounter  Procedures  . CBC  . ECHOCARDIOGRAM COMPLETE    Medication Changes: No orders of the defined types were placed in this encounter.   Disposition:  Follow up August-September  Signed, Kynslei Art, MD  05/03/2018 5:03 PM    [place]

## 2018-05-04 ENCOUNTER — Telehealth: Payer: Self-pay

## 2018-05-04 NOTE — Telephone Encounter (Signed)
lmom for prescreen  

## 2018-05-04 NOTE — Telephone Encounter (Signed)
appt was completed 05/03/18

## 2018-05-05 ENCOUNTER — Encounter: Payer: Self-pay | Admitting: Pharmacist Clinician (PhC)/ Clinical Pharmacy Specialist

## 2018-05-05 ENCOUNTER — Ambulatory Visit (INDEPENDENT_AMBULATORY_CARE_PROVIDER_SITE_OTHER): Payer: Medicare Other | Admitting: *Deleted

## 2018-05-05 ENCOUNTER — Telehealth: Payer: Self-pay | Admitting: *Deleted

## 2018-05-05 ENCOUNTER — Other Ambulatory Visit: Payer: Self-pay

## 2018-05-05 DIAGNOSIS — Z7901 Long term (current) use of anticoagulants: Secondary | ICD-10-CM

## 2018-05-05 DIAGNOSIS — I480 Paroxysmal atrial fibrillation: Secondary | ICD-10-CM | POA: Diagnosis not present

## 2018-05-05 DIAGNOSIS — I4891 Unspecified atrial fibrillation: Secondary | ICD-10-CM | POA: Diagnosis not present

## 2018-05-05 LAB — POCT INR: INR: 5.1 — AB (ref 2.0–3.0)

## 2018-05-05 NOTE — Telephone Encounter (Signed)
Pt called, has question about medication. Pleas call [phone]

## 2018-05-05 NOTE — Telephone Encounter (Signed)
I called patient back to see if I could answer her medication question. Patient stated that she has 2 bottles of coumadin, one from [place] and one from cardiology and wondered if she was supposed to be taking both? I asked her to read the labels to me and she is asking about Warfarin and Keppra. I advised her that they are NOT the same medication, that the warfarin is her blood thinner and the Keppra is for her seizures. She asked if she was supposed to take them both and I advised her that she needed to continue the warfarin as prescribed by her cardiologist and if she had any questions about that she should contact their office, and I also advised her not to stop her seizure medication. She voiced understanding and appreciation.

## 2018-05-06 NOTE — Progress Notes (Signed)
This encounter was created in error - please disregard.

## 2018-05-12 ENCOUNTER — Telehealth: Payer: Self-pay | Admitting: Cardiology

## 2018-05-12 ENCOUNTER — Telehealth: Payer: Self-pay

## 2018-05-12 NOTE — Telephone Encounter (Signed)
Follow up:   Patient returning a call back.  

## 2018-05-12 NOTE — Telephone Encounter (Signed)

## 2018-05-12 NOTE — Telephone Encounter (Signed)
LMOM FOR PRESCREEN  

## 2018-05-14 ENCOUNTER — Ambulatory Visit (INDEPENDENT_AMBULATORY_CARE_PROVIDER_SITE_OTHER): Payer: Medicare Other | Admitting: Pharmacist Clinician (PhC)/ Clinical Pharmacy Specialist

## 2018-05-14 ENCOUNTER — Other Ambulatory Visit: Payer: Self-pay

## 2018-05-14 DIAGNOSIS — I4891 Unspecified atrial fibrillation: Secondary | ICD-10-CM

## 2018-05-14 DIAGNOSIS — I480 Paroxysmal atrial fibrillation: Secondary | ICD-10-CM

## 2018-05-14 DIAGNOSIS — Z7901 Long term (current) use of anticoagulants: Secondary | ICD-10-CM | POA: Diagnosis not present

## 2018-05-14 LAB — POCT INR: INR: 2.3 (ref 2.0–3.0)

## 2018-05-20 ENCOUNTER — Other Ambulatory Visit: Payer: Self-pay | Admitting: Family Medicine

## 2018-05-20 DIAGNOSIS — R911 Solitary pulmonary nodule: Secondary | ICD-10-CM

## 2018-06-01 ENCOUNTER — Telehealth: Payer: Self-pay

## 2018-06-01 NOTE — Telephone Encounter (Signed)
LMOM FOR PRESCREEN  

## 2018-06-02 ENCOUNTER — Telehealth: Payer: Self-pay | Admitting: Internal Medicine

## 2018-06-02 ENCOUNTER — Ambulatory Visit (INDEPENDENT_AMBULATORY_CARE_PROVIDER_SITE_OTHER): Payer: Medicare Other | Admitting: *Deleted

## 2018-06-02 ENCOUNTER — Other Ambulatory Visit: Payer: Self-pay

## 2018-06-02 DIAGNOSIS — R911 Solitary pulmonary nodule: Secondary | ICD-10-CM

## 2018-06-02 DIAGNOSIS — I4891 Unspecified atrial fibrillation: Secondary | ICD-10-CM

## 2018-06-02 DIAGNOSIS — I480 Paroxysmal atrial fibrillation: Secondary | ICD-10-CM

## 2018-06-02 DIAGNOSIS — Z7901 Long term (current) use of anticoagulants: Secondary | ICD-10-CM | POA: Diagnosis not present

## 2018-06-02 LAB — POCT INR: INR: 5.3 — AB (ref 2.0–3.0)

## 2018-06-02 NOTE — Telephone Encounter (Signed)
Returned call to the pt regarding her INR she had done and no answer again. Left her a message, again.

## 2018-06-02 NOTE — Telephone Encounter (Signed)
Follow Up:; ° ° °Returning your call. °

## 2018-06-04 ENCOUNTER — Telehealth: Payer: Self-pay

## 2018-06-04 NOTE — Telephone Encounter (Signed)
LMOM FOR PRESCREEN  

## 2018-06-04 NOTE — Telephone Encounter (Signed)
Please refer to Anticoagulation Encounter from 06/02/2018 as this has been taken care of; thanks.

## 2018-06-07 ENCOUNTER — Other Ambulatory Visit: Payer: Self-pay

## 2018-06-07 ENCOUNTER — Ambulatory Visit (INDEPENDENT_AMBULATORY_CARE_PROVIDER_SITE_OTHER): Payer: Medicare Other | Admitting: Pharmacist

## 2018-06-07 DIAGNOSIS — I4891 Unspecified atrial fibrillation: Secondary | ICD-10-CM | POA: Diagnosis not present

## 2018-06-07 DIAGNOSIS — I480 Paroxysmal atrial fibrillation: Secondary | ICD-10-CM

## 2018-06-07 DIAGNOSIS — Z7901 Long term (current) use of anticoagulants: Secondary | ICD-10-CM

## 2018-06-07 LAB — POCT INR: INR: 1.8 — AB (ref 2.0–3.0)

## 2018-06-16 ENCOUNTER — Telehealth (HOSPITAL_COMMUNITY): Payer: Self-pay | Admitting: Radiology

## 2018-06-16 NOTE — Telephone Encounter (Signed)
Called to schedule echocardiogram 

## 2018-06-22 ENCOUNTER — Telehealth: Payer: Self-pay

## 2018-06-22 NOTE — Telephone Encounter (Signed)
lmom for prescreen  

## 2018-06-23 NOTE — Telephone Encounter (Signed)

## 2018-06-29 ENCOUNTER — Other Ambulatory Visit: Payer: Self-pay

## 2018-06-29 ENCOUNTER — Ambulatory Visit (INDEPENDENT_AMBULATORY_CARE_PROVIDER_SITE_OTHER): Payer: Medicare Other | Admitting: Pharmacist Clinician (PhC)/ Clinical Pharmacy Specialist

## 2018-06-29 DIAGNOSIS — Z7901 Long term (current) use of anticoagulants: Secondary | ICD-10-CM

## 2018-06-29 DIAGNOSIS — I4891 Unspecified atrial fibrillation: Secondary | ICD-10-CM | POA: Diagnosis not present

## 2018-06-29 DIAGNOSIS — I480 Paroxysmal atrial fibrillation: Secondary | ICD-10-CM

## 2018-06-29 LAB — POCT INR: INR: 5.7 — AB (ref 2.0–3.0)

## 2018-06-30 ENCOUNTER — Telehealth (HOSPITAL_COMMUNITY): Payer: Self-pay | Admitting: Radiology

## 2018-06-30 NOTE — Telephone Encounter (Signed)
Left message to call office-Patient needs to schedule an echocardiogram.  

## 2018-07-12 ENCOUNTER — Other Ambulatory Visit: Payer: Medicare Other

## 2018-07-13 ENCOUNTER — Ambulatory Visit (INDEPENDENT_AMBULATORY_CARE_PROVIDER_SITE_OTHER): Payer: Medicare Other | Admitting: Pharmacist Clinician (PhC)/ Clinical Pharmacy Specialist

## 2018-07-13 ENCOUNTER — Other Ambulatory Visit: Payer: Self-pay

## 2018-07-13 DIAGNOSIS — I4891 Unspecified atrial fibrillation: Secondary | ICD-10-CM | POA: Diagnosis not present

## 2018-07-13 DIAGNOSIS — Z7901 Long term (current) use of anticoagulants: Secondary | ICD-10-CM

## 2018-07-13 DIAGNOSIS — I480 Paroxysmal atrial fibrillation: Secondary | ICD-10-CM

## 2018-07-13 LAB — POCT INR: INR: 2.9 (ref 2.0–3.0)

## 2018-07-29 ENCOUNTER — Telehealth: Payer: Self-pay

## 2018-07-29 NOTE — Telephone Encounter (Signed)

## 2018-08-05 ENCOUNTER — Ambulatory Visit (INDEPENDENT_AMBULATORY_CARE_PROVIDER_SITE_OTHER): Payer: Medicare Other | Admitting: Pharmacist

## 2018-08-05 ENCOUNTER — Other Ambulatory Visit: Payer: Self-pay

## 2018-08-05 DIAGNOSIS — I4891 Unspecified atrial fibrillation: Secondary | ICD-10-CM

## 2018-08-05 DIAGNOSIS — Z7901 Long term (current) use of anticoagulants: Secondary | ICD-10-CM | POA: Diagnosis not present

## 2018-08-05 DIAGNOSIS — I480 Paroxysmal atrial fibrillation: Secondary | ICD-10-CM | POA: Diagnosis not present

## 2018-08-05 LAB — POCT INR: INR: 3.8 — AB (ref 2.0–3.0)

## 2018-08-05 MED ORDER — APIXABAN 5 MG PO TABS: 5.0000 mg | ORAL_TABLET | Freq: Two times a day (BID) | ORAL | 3 refills | Status: DC

## 2018-08-05 NOTE — Patient Instructions (Signed)
7/16: HOLD warfarin 7/17: take warfarin 1/2 tablet ONLY 7/18: Start taking Eliquis 5mg  twice daily

## 2018-10-11 ENCOUNTER — Other Ambulatory Visit: Payer: Self-pay | Admitting: Family Medicine

## 2018-10-19 ENCOUNTER — Other Ambulatory Visit: Payer: Self-pay

## 2018-10-19 ENCOUNTER — Ambulatory Visit
Admission: RE | Admit: 2018-10-19 | Discharge: 2018-10-19 | Disposition: A | Discharge status: Home / Self Care | Payer: Medicare Other | Source: Ambulatory Visit | Attending: Family Medicine | Admitting: Family Medicine

## 2018-12-10 ENCOUNTER — Other Ambulatory Visit: Payer: Self-pay | Admitting: Cardiology

## 2018-12-10 MED ORDER — APIXABAN 5 MG PO TABS: 5.0000 mg | ORAL_TABLET | Freq: Two times a day (BID) | ORAL | 1 refills | Status: DC

## 2018-12-10 NOTE — Telephone Encounter (Signed)
78 F 66.7 kg, Scr 0.92 (02/2018), LOV [facility] 04/2018

## 2018-12-31 ENCOUNTER — Other Ambulatory Visit: Payer: Self-pay | Admitting: Family Medicine

## 2018-12-31 DIAGNOSIS — E042 Nontoxic multinodular goiter: Secondary | ICD-10-CM

## 2019-01-10 ENCOUNTER — Ambulatory Visit
Admission: RE | Admit: 2019-01-10 | Discharge: 2019-01-10 | Disposition: A | Discharge status: Home / Self Care | Payer: Medicare Other | Source: Ambulatory Visit | Attending: Family Medicine | Admitting: Family Medicine

## 2019-01-10 DIAGNOSIS — E042 Nontoxic multinodular goiter: Secondary | ICD-10-CM

## 2019-02-11 ENCOUNTER — Ambulatory Visit: Payer: Medicare PPO | Attending: Internal Medicine

## 2019-02-11 DIAGNOSIS — Z23 Encounter for immunization: Secondary | ICD-10-CM | POA: Insufficient documentation

## 2019-02-11 NOTE — Progress Notes (Signed)
   Covid-19 Vaccination Clinic  Name:  Maureen [name]    MRN: [id number] DOB: [birth year]  02/11/2019  [name] was observed post Covid-19 immunization for 15 minutes without incidence. She was provided with Vaccine Information Sheet and instruction to access the V-Safe system.   [name] was instructed to call 911 with any severe reactions post vaccine: Marland Kitchen Difficulty breathing  . Swelling of your face and throat  . A fast heartbeat  . A bad rash all over your body  . Dizziness and weakness    Immunizations Administered    Name Date Dose VIS Date Route   Pfizer COVID-19 Vaccine 02/11/2019 12:00 PM 0.3 mL 12/31/2018 Intramuscular   Manufacturer: [place]   Lot: [id number]   [place]: [id number]

## 2019-02-16 ENCOUNTER — Other Ambulatory Visit: Payer: Self-pay | Admitting: Neurology

## 2019-03-04 ENCOUNTER — Ambulatory Visit: Payer: Medicare PPO | Attending: Internal Medicine

## 2019-03-04 DIAGNOSIS — Z23 Encounter for immunization: Secondary | ICD-10-CM | POA: Insufficient documentation

## 2019-03-04 NOTE — Progress Notes (Signed)
   Covid-19 Vaccination Clinic  Name:  Maureen [name]    MRN: [id number] DOB: [birth year]  03/04/2019  Maureen [name] was observed post Covid-19 immunization for 15 minutes without incidence. She was provided with Vaccine Information Sheet and instruction to access the V-Safe system.   Maureen [name] was instructed to call 911 with any severe reactions post vaccine: Marland Kitchen Difficulty breathing  . Swelling of your face and throat  . A fast heartbeat  . A bad rash all over your body  . Dizziness and weakness    Immunizations Administered    Name Date Dose VIS Date Route   Pfizer COVID-19 Vaccine 03/04/2019  4:59 PM 0.3 mL 12/31/2018 Intramuscular   Manufacturer: [place]   Lot: [id number]   [place]: [id number]

## 2019-03-28 ENCOUNTER — Other Ambulatory Visit: Payer: Self-pay | Admitting: Neurology

## 2019-04-25 ENCOUNTER — Other Ambulatory Visit: Payer: Self-pay | Admitting: Neurology

## 2019-05-02 ENCOUNTER — Telehealth: Payer: Self-pay | Admitting: Neurology

## 2019-05-02 MED ORDER — LEVETIRACETAM 500 MG PO TABS: 500.0000 mg | ORAL_TABLET | Freq: Two times a day (BID) | ORAL | 0 refills | Status: DC

## 2019-05-02 NOTE — Telephone Encounter (Signed)
Pt called wanting to know if she really has to schedule an appointment to get her levETIRAcetam (KEPPRA) 500 MG tablet refilled or if there is another issue going on to why the pharmacist can not fill this prescription for 90 days. Please advise.

## 2019-05-02 NOTE — Telephone Encounter (Signed)
In review of the chart, it was determined patient was past due for an appt. She is now scheduled w/ Debbora Presto, NP on 06/28/19. 90-day rx sent to the pharmacy.

## 2019-05-02 NOTE — Addendum Note (Signed)
Addended by: Desmond Lope on: 05/02/2019 06:06 PM   Modules accepted: Orders

## 2019-06-19 ENCOUNTER — Other Ambulatory Visit: Payer: Self-pay | Admitting: Cardiovascular Disease

## 2019-06-28 ENCOUNTER — Encounter: Payer: Self-pay | Admitting: Family Medicine

## 2019-06-28 ENCOUNTER — Ambulatory Visit: Payer: Medicare PPO | Admitting: Family Medicine

## 2019-06-28 VITALS — BP 142/72 | HR 62 | Ht 61.0 in | Wt 155.0 lb

## 2019-06-28 DIAGNOSIS — R569 Unspecified convulsions: Secondary | ICD-10-CM

## 2019-06-28 DIAGNOSIS — D329 Benign neoplasm of meninges, unspecified: Secondary | ICD-10-CM

## 2019-06-28 DIAGNOSIS — G4733 Obstructive sleep apnea (adult) (pediatric): Secondary | ICD-10-CM

## 2019-06-28 MED ORDER — LEVETIRACETAM 500 MG PO TABS: 500.0000 mg | ORAL_TABLET | Freq: Two times a day (BID) | ORAL | 3 refills | Status: DC

## 2019-06-28 NOTE — Patient Instructions (Addendum)
We will continue levetiracetam 500mg  twice daily. Please avoid missed doses. Follow up closely with PCP for concerns of pain in hands. I am concerned that it could be arthritis. Continue topical cream if it helps.   Stay active. Stay well hydrated. Regular activity encouraged. Consider treating sleep apnea. You can call me if you change your mind.   Follow up in 1 year    Seizure, Adult A seizure is a sudden burst of abnormal electrical activity in the brain. Seizures usually last from 30 seconds to 2 minutes. They can cause many different symptoms. Usually, seizures are not harmful unless they last a long time. What are the causes? Common causes of this condition include:  Fever or infection.  Conditions that affect the brain, such as: ? A brain abnormality that you were born with. ? A brain or head injury. ? Bleeding in the brain. ? A tumor. ? Stroke. ? Brain disorders such as autism or cerebral palsy.  Low blood sugar.  Conditions that are passed from parent to child (are inherited).  Problems with substances, such as: ? Having a reaction to a drug or a medicine. ? Suddenly stopping the use of a substance (withdrawal). In some cases, the cause may not be known. A person who has repeated seizures over time without a clear cause has a condition called epilepsy. What increases the risk? You are more likely to get this condition if you have:  A family history of epilepsy.  Had a seizure in the past.  A brain disorder.  A history of head injury, lack of oxygen at birth, or strokes. What are the signs or symptoms? There are many types of seizures. The symptoms vary depending on the type of seizure you have. Examples of symptoms during a seizure include:  Shaking (convulsions).  Stiffness in the body.  Passing out (losing consciousness).  Head nodding.  Staring.  Not responding to sound or touch.  Loss of bladder control and bowel control. Some people have  symptoms right before and right after a seizure happens. Symptoms before a seizure may include:  Fear.  Worry (anxiety).  Feeling like you may vomit (nauseous).  Feeling like the room is spinning (vertigo).  Feeling like you saw or heard something before (dj vu).  Odd tastes or smells.  Changes in how you see. You may see flashing lights or spots. Symptoms after a seizure happens can include:  Confusion.  Sleepiness.  Headache.  Weakness on one side of the body. How is this treated? Most seizures will stop on their own in under 5 minutes. In these cases, no treatment is needed. Seizures that last longer than 5 minutes will usually need treatment. Treatment can include:  Medicines given through an IV tube.  Avoiding things that are known to cause your seizures. These can include medicines that you take for another condition.  Medicines to treat epilepsy.  Surgery to stop the seizures. This may be needed if medicines do not help. Follow these instructions at home: Medicines  Take over-the-counter and prescription medicines only as told by your doctor.  Do not eat or drink anything that may keep your medicine from working, such as alcohol. Activity  Do not do any activities that would be dangerous if you had another seizure, like driving or swimming. Wait until your doctor says it is safe for you to do them.  If you live in the [place] ask your local DMV (department of motor vehicles) when you can drive.  Get plenty of rest. Teaching others Teach friends and family what to do when you have a seizure. They should:  Lay you on the ground.  Protect your head and body.  Loosen any tight clothing around your neck.  Turn you on your side.  Not hold you down.  Not put anything into your mouth.  Know whether or not you need emergency care.  Stay with you until you are better.  General instructions  Contact your doctor each time you have a seizure.  Avoid  anything that gives you seizures.  Keep a seizure diary. Write down: ? What you think caused each seizure. ? What you remember about each seizure.  Keep all follow-up visits as told by your doctor. This is important. Contact a doctor if:  You have another seizure.  You have seizures more often.  There is any change in what happens during your seizures.  You keep having seizures with treatment.  You have symptoms of being sick or having an infection. Get help right away if:  You have a seizure that: ? Lasts longer than 5 minutes. ? Is different than seizures you had before. ? Makes it harder to breathe. ? Happens after you hurt your head.  You have any of these symptoms after a seizure: ? Not being able to speak. ? Not being able to use a part of your body. ? Confusion. ? A bad headache.  You have two or more seizures in a row.  You do not wake up right after a seizure.  You get hurt during a seizure. These symptoms may be an emergency. Do not wait to see if the symptoms will go away. Get medical help right away. Call your local emergency services (911 in the [place] Do not drive yourself to the hospital. Summary  Seizures usually last from 30 seconds to 2 minutes. Usually, they are not harmful unless they last a long time.  Do not eat or drink anything that may keep your medicine from working, such as alcohol.  Teach friends and family what to do when you have a seizure.  Contact your doctor each time you have a seizure. This information is not intended to replace advice given to you by your health care provider. Make sure you discuss any questions you have with your health care provider. Document Revised: 03/26/2018 Document Reviewed: 03/26/2018 Elsevier Patient Education  [place].    Getting used to CPAP and staying with the treatment long term does take time and patience and discipline. Untreated obstructive sleep apnea when it is moderate to severe  can have an adverse impact on cardiovascular health and raise her risk for heart disease, arrhythmias, hypertension, congestive heart failure, stroke and diabetes. Untreated obstructive sleep apnea causes sleep disruption, nonrestorative sleep, and sleep deprivation. This can have an impact on your day to day functioning and cause daytime sleepiness and impairment of cognitive function, memory loss, mood disturbance, and problems focussing.    Sleep Apnea Sleep apnea affects breathing during sleep. It causes breathing to stop for a short time or to become shallow. It can also increase the risk of:  Heart attack.  Stroke.  Being very overweight (obese).  Diabetes.  Heart failure.  Irregular heartbeat. The goal of treatment is to help you breathe normally again. What are the causes? There are three kinds of sleep apnea:  Obstructive sleep apnea. This is caused by a blocked or collapsed airway.  Central sleep apnea. This happens when the  brain does not send the right signals to the muscles that control breathing.  Mixed sleep apnea. This is a combination of obstructive and central sleep apnea. The most common cause of this condition is a collapsed or blocked airway. This can happen if:  Your throat muscles are too relaxed.  Your tongue and tonsils are too large.  You are overweight.  Your airway is too small. What increases the risk?  Being overweight.  Smoking.  Having a small airway.  Being older.  Being female.  Drinking alcohol.  Taking medicines to calm yourself (sedatives or tranquilizers).  Having family members with the condition. What are the signs or symptoms?  Trouble staying asleep.  Being sleepy or tired during the day.  Getting angry a lot.  Loud snoring.  Headaches in the morning.  Not being able to focus your mind (concentrate).  Forgetting things.  Less interest in sex.  Mood swings.  Personality changes.  Feelings of sadness  (depression).  Waking up a lot during the night to pee (urinate).  Dry mouth.  Sore throat. How is this diagnosed?  Your medical history.  A physical exam.  A test that is done when you are sleeping (sleep study). The test is most often done in a sleep lab but may also be done at home. How is this treated?   Sleeping on your side.  Using a medicine to get rid of mucus in your nose (decongestant).  Avoiding the use of alcohol, medicines to help you relax, or certain pain medicines (narcotics).  Losing weight, if needed.  Changing your diet.  Not smoking.  Using a machine to open your airway while you sleep, such as: ? An oral appliance. This is a mouthpiece that shifts your lower jaw forward. ? A CPAP device. This device blows air through a mask when you breathe out (exhale). ? An EPAP device. This has valves that you put in each nostril. ? A BPAP device. This device blows air through a mask when you breathe in (inhale) and breathe out.  Having surgery if other treatments do not work. It is important to get treatment for sleep apnea. Without treatment, it can lead to:  High blood pressure.  Coronary artery disease.  In men, not being able to have an erection (impotence).  Reduced thinking ability. Follow these instructions at home: Lifestyle  Make changes that your doctor recommends.  Eat a healthy diet.  Lose weight if needed.  Avoid alcohol, medicines to help you relax, and some pain medicines.  Do not use any products that contain nicotine or tobacco, such as cigarettes, e-cigarettes, and chewing tobacco. If you need help quitting, ask your doctor. General instructions  Take over-the-counter and prescription medicines only as told by your doctor.  If you were given a machine to use while you sleep, use it only as told by your doctor.  If you are having surgery, make sure to tell your doctor you have sleep apnea. You may need to bring your device with  you.  Keep all follow-up visits as told by your doctor. This is important. Contact a doctor if:  The machine that you were given to use during sleep bothers you or does not seem to be working.  You do not get better.  You get worse. Get help right away if:  Your chest hurts.  You have trouble breathing in enough air.  You have an uncomfortable feeling in your back, arms, or stomach.  You have trouble  talking.  One side of your body feels weak.  A part of your face is hanging down. These symptoms may be an emergency. Do not wait to see if the symptoms will go away. Get medical help right away. Call your local emergency services (911 in the [place] Do not drive yourself to the hospital. Summary  This condition affects breathing during sleep.  The most common cause is a collapsed or blocked airway.  The goal of treatment is to help you breathe normally while you sleep. This information is not intended to replace advice given to you by your health care provider. Make sure you discuss any questions you have with your health care provider. Document Revised: 10/23/2017 Document Reviewed: 09/01/2017 Elsevier Patient Education  [place].

## 2019-06-28 NOTE — Progress Notes (Signed)
PATIENT: Maureen [name] DOB: April 08, 1940  REASON FOR VISIT: follow up HISTORY FROM: patient  Chief Complaint  Patient presents with  . Follow-up    Rm 2 here for a f/u on epilepsy. Pt said she is experiencing aching in her hands     HISTORY OF PRESENT ILLNESS: Today 06/28/19 Maureen [name] is a 79 y.o. female here today for follow up for history of epilepsy. She continues levetiracetam 500 BID. She denies any seizure activity since last being seen. She does mention an episode three months ago where she had "tightening" of the muscles of right lower extremity for about 30 minutes. She was lying in bed and noted symptoms. She was completely alert. She reports her husband was asleep so she did not want to make noises but could have talked if she needed to. She tried rolling over th her left side but symptoms continued. She states spasms spontaneously resolved. She does report missing several doses (unsure exactly how long she had been out of medication) just prior to event. She also reports not taking levetiracetam for the past week due to not having refills. No other seizure concerns. She is tolerating medicaton well. She does have persistent aching pain of both hands. She feels it is related to arthritis. She uses a topical cream which helps.   She was diagnosed with sleep apnea last year. AHI 40/hr. She does not wish to use CPAP therapy. She reports reading about sleep apnea and does not wish to start therapy. She is concerned that her husband "has gotten dependent" on his CPAP and she is adamant that she will not use it.   HISTORY: (copied from Dr Dohmeier's note on 03/03/2018)  Ms. Covey is an 79 year old female with a history of seizures and meningiomata. This patient is a left-handed Caucasian married female with a history of a left craniotomy for a left parasagittal meningioma resection on 01/04/1991. Recent admission to hospital ( 02-22-2018)  for new onset atrial fibrillation, echo  and cardioversion.  Medications were changed due to need for anticoagulation, and the patient placed on Keppra, Leviracetam.  She was weaned off Carbamazepine- completely .  She was only during her hs hospital stay placed on amiodarone,  this needed not  to be continued after succesfull cardioversion. She remains on TOPROL, feels tired.   She is here for her routine yearly seizure follow up , but needs a sleep study for atrial fib risk work up. Atrial hypertrophy.   She remains hoarse since intubation.   Interval history from 11/10/2016 I have the pleasure of seeing Mrs. Aerica Rincon. Fleagel today, meanwhile 79 years old, And followed in our practice for a remote history of seizures.Patient is tolerating Tegretol XL 4 years very well, she operates a motor vehicle without difficulty had no accidents. She denies any recent seizure events. The last possible seizure was 2002, years after her craniotomy. She reports concern of recurrence of meningeomata. She had her last MRI 20 month ago. Reviewed today the MRI study from January 2017, which documented multiple small meningiomata. These measure in the millimeter range. They could however have grown in the meanwhile. I will repeat this brain MRI study in November or December of this year    08-03-2015 MM. Ms. Yum is a 79 year old female with a history of seizures and meningiomata. She returns today for follow-up. She states that she is doing well. She continues to take Tegretol XL and tolerating it well. Denies any seizure events. She operates  a motor vehicle without difficulty. Denies any changes with her gait or balance. Able to complete all ADLs independently. She returns today for an evaluation.  HISTORY 07/11/14 (MM): Ms. Floren is a 79 year old female with a history of seizures and meningioma. She returns today for follow-up. Patient is currently taken Tegretol XL and tolerating it well. She reports that she is not had a seizure since the last visit. She  is able to complete all ADLs independently. She operates a Teacher, music without difficulty. Since the last visit she does notice a mild tremor in the left hand is intermittent. She denies any changes with her gait or balance. She continues to take calcium and vitamin D supplementation for osteopenia She returns today for an evaluation.  HISTORY 05/26/13 (CD):  Maureen [name] is a 79 y.o. female seen at [facility] here as a transfer of care from [facility]. She is a retired Tourist information centre manager , with a Scientist, water quality , taught elementary school.  Patient has been followed him as her primary care provider by Dr. Osvaldo Human, now Dr Serita Grammes. This patient is a left-handed Caucasian married female with a history of a left craniotomy for a left parasagittal meningioma resection on 01/04/1991. Shehas been followed by Dr. Morene Antu since July 1992 . Her last seizure is now over 10 years ago.  Preoperatively she was treated with Decadron and Phenobarbital after she developed a seizure , focal to the right body, this let to an evaluation and the discovery of the meningeoma. After the surgery she was started on Dilantin but developed a rash and became ataxic to this medication was discontinued. She continued for while to have abnormal sensations in her right leg ans sometimes right arm , which in most respects were identified a simple partial seizures. Inspired Dr. Tressia Danas last EEG was normal. She continued to the right foot and leg motor seizures that she describes as a spasm of the right foot. Dr. Erling Cruz placed onTegretol since she had marked improvement. At the time of his last visit on 08/06/2009 the patient took brand nameTegretol-XR 200 mg twice a day she had a slight decrease in serum Na levels and 2008 underwent DEXA scan, which was normal. She still reported about once a year A symptom of leg spasm, no seizures per se. Possible symptom of spasm beginning in the right Foot and ankle up going to the right knee and  then radiating downwards to the foot with a duration of less than a minute. A repeated DEXA scan revealed ion13 April 2014 newly diagnosed severe osteopenia. She begun taking vitamin D and calcium, recently in the last 6 month.    REVIEW OF SYSTEMS: Out of a complete 14 system review of symptoms, the patient complains only of the following symptoms, seizure, pain of both hands and all other reviewed systems are negative.  ALLERGIES: Allergies  Allergen Reactions  . Dilantin [Phenytoin Sodium Extended]      Stupor, ataxia.   Marland Kitchen Hydrocodone Nausea And Vomiting    HOME MEDICATIONS: Outpatient Medications Prior to Visit  Medication Sig Dispense Refill  . calcium-vitamin D (OSCAL WITH D) 500-200 MG-UNIT per tablet Take 1 tablet by mouth 2 (two) times daily.     . cholecalciferol (VITAMIN D) 1000 UNITS tablet Take 1,000 Units by mouth daily.    Marland Kitchen ELIQUIS 5 MG TABS tablet TAKE 1 TABLET BY MOUTH TWICE DAILY TO REPLACE WARFARIN 180 tablet 1  . furosemide (LASIX) 40 MG tablet Take 1 tablet on Mon Wed  and Fri ONLY 30 tablet 6  . lisinopril (PRINIVIL,ZESTRIL) 2.5 MG tablet Take 1 tablet (2.5 mg total) by mouth at bedtime. 30 tablet 6  . metoprolol succinate (TOPROL-XL) 100 MG 24 hr tablet Take 1 tablet (100 mg total) by mouth daily. Take with or immediately following a meal. 30 tablet 6  . Multiple Vitamin (MULTIVITAMIN WITH MINERALS) TABS tablet Take 1 tablet by mouth daily.    . pravastatin (PRAVACHOL) 40 MG tablet Take 40 mg by mouth daily.     Marland Kitchen levETIRAcetam (KEPPRA) 500 MG tablet Take 1 tablet (500 mg total) by mouth 2 (two) times daily. 180 tablet 0  . PROAIR HFA 108 (90 Base) MCG/ACT inhaler Inhale 2 puffs into the lungs every 4 (four) hours as needed.  0   No facility-administered medications prior to visit.    PAST MEDICAL HISTORY: Past Medical History:  Diagnosis Date  . Atrial fibrillation ([place])   . Benign tumor of meninges ([place])   . Chronic combined systolic and diastolic CHF,  NYHA class 2 and ACA/AHA stage C 01/2018   Exacerbated by A. fib; EF 35 to 40% with global HK.  Reduced cardiac output and index (3.2/1.9).   -Associated moderate pulmonary hypertension-(mean PA P 38 mmHg)  . GERD (gastroesophageal reflux disease)    occ. in past  . Headache(784.0)   . HTN (hypertension), benign    pt. states no hypertension  . NICM (nonischemic cardiomyopathy) ([place]) 01/2018   Echo with EF of 35 to 40%.  Global HK.  Mild to moderate MR.  Mild nonobstructive CAD by cath.  Cardiac output/index 3.2/1.9  . Pneumonia    ? as child  . PONV (postoperative nausea and vomiting)   . Seizures ([place])    positive for HBP    PAST SURGICAL HISTORY: Past Surgical History:  Procedure Laterality Date  . CARDIOVERSION N/A 02/24/2018   Procedure: CARDIOVERSION;  Surgeon: Sueanne Margarita, MD;  [facility] ENDOSCOPY;  Service: Cardiovascular;  Laterality: N/A;  . CATARACT EXTRACTION, BILATERAL Bilateral   . CHOLECYSTECTOMY    . COLONOSCOPY WITH PROPOFOL N/A 11/30/2012   Procedure: COLONOSCOPY WITH PROPOFOL;  Surgeon: Garlan Fair, MD;  Location: [facility] ENDOSCOPY;  Service: Endoscopy;  Laterality: N/A;  . ESOPHAGOGASTRODUODENOSCOPY (EGD) WITH PROPOFOL N/A 11/30/2012   Procedure: ESOPHAGOGASTRODUODENOSCOPY (EGD) WITH PROPOFOL;  Surgeon: Garlan Fair, MD;  Location: [facility] ENDOSCOPY;  Service: Endoscopy;  Laterality: N/A;  . ESOPHAGOGASTRODUODENOSCOPY (EGD) WITH PROPOFOL N/A 03/14/2015   Procedure: ESOPHAGOGASTRODUODENOSCOPY (EGD) WITH PROPOFOL;  Surgeon: Arta Silence, MD;  Location: [facility] ENDOSCOPY;  Service: Endoscopy;  Laterality: N/A;  . EUS N/A 01/05/2013   Procedure: FULL UPPER ENDOSCOPIC ULTRASOUND (EUS) RADIAL;  Surgeon: Arta Silence, MD;  Location: [facility] ENDOSCOPY;  Service: Endoscopy;  Laterality: N/A;  EUS with possible FNA  . EUS N/A 03/14/2015   Procedure: UPPER ENDOSCOPIC ULTRASOUND (EUS) RADIAL;  Surgeon: Arta Silence, MD;  Location: [facility] ENDOSCOPY;  Service: Endoscopy;  Laterality:  N/A;  . EYE SURGERY Left    "few weeks ago scar tissue laser surgery"  . FINE NEEDLE ASPIRATION N/A 01/05/2013   Procedure: FINE NEEDLE ASPIRATION (FNA) RADIAL;  Surgeon: Arta Silence, MD;  Location: [facility] ENDOSCOPY;  Service: Endoscopy;  Laterality: N/A;  . KNEE ARTHROSCOPY Left   . Stem  12/1990  . RIGHT/LEFT HEART CATH AND CORONARY ANGIOGRAPHY N/A 02/22/2018   Procedure: RIGHT/LEFT HEART CATH AND CORONARY ANGIOGRAPHY;  Surgeon: Larey Dresser, MD;  Location: [facility] INVASIVE CV LAB;;  Nonobstructive CAD.  Mild to  moderate pulmonary hypertension (PA P 47/24 mmHg-mean 38 mmHg.  PCWP 22 mmHg with LVEDP 24 mmHg. CO/CI = 3.2/1.9  . TEE WITHOUT CARDIOVERSION N/A 02/24/2018   Procedure: TRANSESOPHAGEAL ECHOCARDIOGRAM (TEE) - WITH DCCV;  Surgeon: Sueanne Margarita, MD;  Location: [facility] ENDOSCOPY;; EF 35-40%.  Global HK.  No R WMA. Mild AI, Mild-Mod MR. Severe LA dilation (no LAA thrombus).  Severely reduced RV function.  Mild RA dilation --successful DCCV  . TRANSTHORACIC ECHOCARDIOGRAM  02/18/2018   EF 35 to 40%.  Unable to assess diastolic function because of A. fib.  No obvious regional wall motion abnormality.  Global hypokinesis.  Severe LA dilation.  Mild-moderate MR.  Mild AI without AS  . VAGINAL HYSTERECTOMY  1983    FAMILY HISTORY: Family History  Problem Relation Age of Onset  . Dementia Father        lewy body dementia  . Heart disease Other        PACEMAKER DEFIB.      SOCIAL HISTORY: Social History   Socioeconomic History  . Marital status: Married    Spouse name: Jan  . Number of children: 2  . Years of education: Master's  . Highest education level: Not on file  Occupational History  . Occupation: retired    Comment: [employer] in [place]: RETIRED  Tobacco Use  . Smoking status: Never Smoker  . Smokeless tobacco: Never Used  Substance and Sexual Activity  . Alcohol use: Yes    Alcohol/week: 0.0 standard drinks    Comment: once monthly   . Drug use: No  . Sexual activity: Not on file  Other Topics Concern  . Not on file  Social History Narrative   She retired in 1995 from [employer]. Lives at home with her husband, Jan. They have a son age 82 and a  son age 23. Cafffeine once daily. a week and alcohol once a month. Denies tobacco and illicit drug use.    Patient is left handed.   Patient has a Master's degree.   Social Determinants of Health   Financial Resource Strain:   . Difficulty of Paying Living Expenses:   Food Insecurity:   . Worried About Charity fundraiser in the Last Year:   . Arboriculturist in the Last Year:   Transportation Needs:   . Film/video editor (Medical):   Marland Kitchen Lack of Transportation (Non-Medical):   Physical Activity:   . Days of Exercise per Week:   . Minutes of Exercise per Session:   Stress:   . Feeling of Stress :   Social Connections:   . Frequency of Communication with Friends and Family:   . Frequency of Social Gatherings with Friends and Family:   . Attends Religious Services:   . Active Member of Clubs or Organizations:   . Attends Archivist Meetings:   Marland Kitchen Marital Status:   Intimate Partner Violence:   . Fear of Current or Ex-Partner:   . Emotionally Abused:   Marland Kitchen Physically Abused:   . Sexually Abused:       PHYSICAL EXAM  Vitals:   06/28/19 1316  BP: (!) 142/72  Pulse: 62  Weight: 155 lb (70.3 kg)  Height: 5\' 1"  (1.549 m)   Body mass index is 29.29 kg/m.  Generalized: Well developed, in no acute distress  Cardiology: normal rate and rhythm, no murmur noted Respiratory: clear to auscultation bilaterally  Neurological examination  Mentation: Alert  oriented to time, place, history taking. Follows all commands speech and language fluent Cranial nerve II-XII: Pupils were equal round reactive to light. Extraocular movements were full, visual field were full on confrontational test. Facial sensation and strength were normal. Head turning  and shoulder shrug  were normal and symmetric. Motor: The motor testing reveals 5 over 5 strength of all 4 extremities. Good symmetric motor tone is noted throughout.  Sensory: Sensory testing is intact to soft touch on all 4 extremities. No evidence of extinction is noted.  Coordination: Cerebellar testing reveals good finger-nose-finger and heel-to-shin bilaterally.  Gait and station: Gait is normal.   DIAGNOSTIC DATA (LABS, IMAGING, TESTING) - I reviewed patient records, labs, notes, testing and imaging myself where available.  No flowsheet data found.   Lab Results  Component Value Date   WBC 7.7 02/25/2018   HGB 11.7 (L) 02/25/2018   HCT 37.1 02/25/2018   MCV 91.4 02/25/2018   PLT 273 02/25/2018      Component Value Date/Time   NA 142 03/11/2018 1012   K 4.7 03/11/2018 1012   CL 99 03/11/2018 1012   CO2 29 03/11/2018 1012   GLUCOSE 82 03/11/2018 1012   GLUCOSE 127 (H) 02/24/2018 0334   BUN 26 03/11/2018 1012   CREATININE 0.92 03/11/2018 1012   CALCIUM 9.8 03/11/2018 1012   PROT 6.4 08/31/2017 1508   ALBUMIN 4.5 08/31/2017 1508   AST 19 08/31/2017 1508   ALT 19 08/31/2017 1508   ALKPHOS 88 08/31/2017 1508   BILITOT <0.2 08/31/2017 1508   GFRNONAA 59 (L) 03/11/2018 1012   GFRAA 68 03/11/2018 1012   Lab Results  Component Value Date   CHOL 144 02/18/2018   HDL 55 02/18/2018   LDLCALC 75 02/18/2018   TRIG 71 02/18/2018   CHOLHDL 2.6 02/18/2018   No results found for: HGBA1C No results found for: VITAMINB12 Lab Results  Component Value Date   TSH 3.211 02/17/2018       ASSESSMENT AND PLAN 79 y.o. year old female  has a past medical history of Atrial fibrillation ([place]), Benign tumor of meninges ([place]), Chronic combined systolic and diastolic CHF, NYHA class 2 and ACA/AHA stage C (01/2018), GERD (gastroesophageal reflux disease), Headache(784.0), HTN (hypertension), benign, NICM (nonischemic cardiomyopathy) ([place]) (01/2018), Pneumonia, PONV (postoperative nausea  and vomiting), and Seizures ([place]). here with     ICD-10-CM   1. Seizures ([place])  R56.9 MR BRAIN W WO CONTRAST  2. Meningioma ([place])  D32.9 MR BRAIN W WO CONTRAST  3. OSA (obstructive sleep apnea)  G47.33     Mrs Spiewak reports that she is doing well.  She is tolerating levetiracetam 500 mg twice daily.  We will continue current therapy.  She was educated on the importance of medication compliance to prevent breakthrough seizure.  I have encouraged her to take this medication every day and avoid missed doses.  Seizure precautions reviewed.  Adequate hydration discussed.  I will order a repeat MRI for evaluation of meningiomas.  Last MRI in March 2020 did show enlargement of previous right and left frontal meningiomas and new right frontal lesion.  We have reviewed her sleep study results.  She is adamant that she does not wish to use CPAP therapy.  I have provided her with information for her review via AVS.  She is aware that she may call should she change her mind.  She will continue close follow-up with primary care for management of comorbidities.    Orders Placed This Encounter  Procedures  . MR BRAIN W WO CONTRAST    Standing Status:   Future    Standing Expiration Date:   06/27/2020    Order Specific Question:   If indicated for the ordered procedure, I authorize the administration of contrast media per Radiology protocol    Answer:   Yes    Order Specific Question:   What is the patient's sedation requirement?    Answer:   No Sedation    Order Specific Question:   Does the patient have a pacemaker or implanted devices?    Answer:   No    Order Specific Question:   Radiology Contrast Protocol - do NOT remove file path    Answer:   [email or web address]    Order Specific Question:   Preferred imaging location?    Answer:   [facility] (table limit-550lbs)     Meds ordered this encounter  Medications  . levETIRAcetam (KEPPRA) 500 MG tablet    Sig: Take 1  tablet (500 mg total) by mouth 2 (two) times daily.    Dispense:  180 tablet    Refill:  3    Pt has pending appt 06/28/19.    Order Specific Question:   Supervising Provider    Answer:   Melvenia Beam [[id number]]      I spent 30 minutes with the patient. 50% of this time was spent counseling and educating patient on plan of care and medications.    Debbora Presto, FNP-C 06/28/2019, 3:17 PM [facility] [address], [place], [place] [address] [phone]

## 2019-06-30 ENCOUNTER — Telehealth: Payer: Self-pay | Admitting: Family Medicine

## 2019-06-30 NOTE — Telephone Encounter (Signed)
[employer] Josem Kaufmann: [phone] (exp. 06/30/19 to 07/30/19) 06/28/19 PATIENT WANTS TO SPEAK W/ DOCTOR BEFORE SCHEDULING APPT/WILL CALL BACK IF APPT IS STILL NEEDED/EPIC ORDER/BG

## 2019-07-30 ENCOUNTER — Encounter: Payer: Self-pay | Admitting: Family Medicine

## 2019-11-21 HISTORY — PX: TRANSTHORACIC ECHOCARDIOGRAM: SHX275

## 2019-12-10 ENCOUNTER — Emergency Department (HOSPITAL_COMMUNITY): Payer: Medicare PPO

## 2019-12-10 ENCOUNTER — Other Ambulatory Visit: Payer: Self-pay

## 2019-12-10 ENCOUNTER — Encounter (HOSPITAL_COMMUNITY): Payer: Self-pay | Admitting: *Deleted

## 2019-12-10 ENCOUNTER — Inpatient Hospital Stay (HOSPITAL_COMMUNITY)
Admission: EM | Admit: 2019-12-10 | Discharge: 2019-12-12 | DRG: 291 | Disposition: A | Discharge status: Home / Self Care | Payer: Medicare PPO | Attending: Internal Medicine | Admitting: Internal Medicine

## 2019-12-10 DIAGNOSIS — G40909 Epilepsy, unspecified, not intractable, without status epilepticus: Secondary | ICD-10-CM | POA: Diagnosis present

## 2019-12-10 DIAGNOSIS — J9811 Atelectasis: Secondary | ICD-10-CM | POA: Diagnosis present

## 2019-12-10 DIAGNOSIS — I5023 Acute on chronic systolic (congestive) heart failure: Secondary | ICD-10-CM | POA: Diagnosis not present

## 2019-12-10 DIAGNOSIS — K219 Gastro-esophageal reflux disease without esophagitis: Secondary | ICD-10-CM | POA: Diagnosis present

## 2019-12-10 DIAGNOSIS — I428 Other cardiomyopathies: Secondary | ICD-10-CM | POA: Diagnosis present

## 2019-12-10 DIAGNOSIS — Z885 Allergy status to narcotic agent status: Secondary | ICD-10-CM | POA: Diagnosis not present

## 2019-12-10 DIAGNOSIS — Z820 Family history of epilepsy and other diseases of the nervous system: Secondary | ICD-10-CM

## 2019-12-10 DIAGNOSIS — I5043 Acute on chronic combined systolic (congestive) and diastolic (congestive) heart failure: Secondary | ICD-10-CM | POA: Diagnosis present

## 2019-12-10 DIAGNOSIS — I50814 Right heart failure due to left heart failure: Secondary | ICD-10-CM | POA: Diagnosis not present

## 2019-12-10 DIAGNOSIS — I480 Paroxysmal atrial fibrillation: Secondary | ICD-10-CM

## 2019-12-10 DIAGNOSIS — Z86011 Personal history of benign neoplasm of the brain: Secondary | ICD-10-CM

## 2019-12-10 DIAGNOSIS — I110 Hypertensive heart disease with heart failure: Principal | ICD-10-CM | POA: Diagnosis present

## 2019-12-10 DIAGNOSIS — N179 Acute kidney failure, unspecified: Secondary | ICD-10-CM | POA: Diagnosis present

## 2019-12-10 DIAGNOSIS — Z7901 Long term (current) use of anticoagulants: Secondary | ICD-10-CM | POA: Diagnosis not present

## 2019-12-10 DIAGNOSIS — E785 Hyperlipidemia, unspecified: Secondary | ICD-10-CM | POA: Diagnosis present

## 2019-12-10 DIAGNOSIS — Z9841 Cataract extraction status, right eye: Secondary | ICD-10-CM | POA: Diagnosis not present

## 2019-12-10 DIAGNOSIS — Z888 Allergy status to other drugs, medicaments and biological substances status: Secondary | ICD-10-CM

## 2019-12-10 DIAGNOSIS — R0602 Shortness of breath: Secondary | ICD-10-CM

## 2019-12-10 DIAGNOSIS — Z800 Family history of malignant neoplasm of digestive organs: Secondary | ICD-10-CM

## 2019-12-10 DIAGNOSIS — G4733 Obstructive sleep apnea (adult) (pediatric): Secondary | ICD-10-CM | POA: Diagnosis present

## 2019-12-10 DIAGNOSIS — Z90710 Acquired absence of both cervix and uterus: Secondary | ICD-10-CM | POA: Diagnosis not present

## 2019-12-10 DIAGNOSIS — Z9842 Cataract extraction status, left eye: Secondary | ICD-10-CM

## 2019-12-10 DIAGNOSIS — Z9049 Acquired absence of other specified parts of digestive tract: Secondary | ICD-10-CM

## 2019-12-10 DIAGNOSIS — I509 Heart failure, unspecified: Secondary | ICD-10-CM

## 2019-12-10 DIAGNOSIS — I4819 Other persistent atrial fibrillation: Secondary | ICD-10-CM | POA: Diagnosis not present

## 2019-12-10 DIAGNOSIS — I2510 Atherosclerotic heart disease of native coronary artery without angina pectoris: Secondary | ICD-10-CM | POA: Diagnosis present

## 2019-12-10 DIAGNOSIS — J90 Pleural effusion, not elsewhere classified: Secondary | ICD-10-CM

## 2019-12-10 DIAGNOSIS — Z79899 Other long term (current) drug therapy: Secondary | ICD-10-CM | POA: Diagnosis not present

## 2019-12-10 DIAGNOSIS — I272 Pulmonary hypertension, unspecified: Secondary | ICD-10-CM | POA: Diagnosis present

## 2019-12-10 DIAGNOSIS — I4891 Unspecified atrial fibrillation: Secondary | ICD-10-CM

## 2019-12-10 DIAGNOSIS — Z20822 Contact with and (suspected) exposure to covid-19: Secondary | ICD-10-CM | POA: Diagnosis present

## 2019-12-10 DIAGNOSIS — I088 Other rheumatic multiple valve diseases: Secondary | ICD-10-CM | POA: Diagnosis present

## 2019-12-10 LAB — RESPIRATORY PANEL BY RT PCR (FLU A&B, COVID)
Influenza A by PCR: NEGATIVE
Influenza B by PCR: NEGATIVE
SARS Coronavirus 2 by RT PCR: NEGATIVE

## 2019-12-10 LAB — CBC
HCT: 42.4 % (ref 36.0–46.0)
Hemoglobin: 13.2 g/dL (ref 12.0–15.0)
MCH: 28 pg (ref 26.0–34.0)
MCHC: 31.1 g/dL (ref 30.0–36.0)
MCV: 90 fL (ref 80.0–100.0)
Platelets: 410 10*3/uL — ABNORMAL HIGH (ref 150–400)
RBC: 4.71 MIL/uL (ref 3.87–5.11)
RDW: 14.4 % (ref 11.5–15.5)
WBC: 12.1 10*3/uL — ABNORMAL HIGH (ref 4.0–10.5)
nRBC: 0.4 % — ABNORMAL HIGH (ref 0.0–0.2)

## 2019-12-10 LAB — BASIC METABOLIC PANEL
Anion gap: 12 (ref 5–15)
BUN: 48 mg/dL — ABNORMAL HIGH (ref 8–23)
CO2: 21 mmol/L — ABNORMAL LOW (ref 22–32)
Calcium: 9.4 mg/dL (ref 8.9–10.3)
Chloride: 103 mmol/L (ref 98–111)
Creatinine, Ser: 1.34 mg/dL — ABNORMAL HIGH (ref 0.44–1.00)
GFR, Estimated: 40 mL/min — ABNORMAL LOW (ref >60)
Glucose, Bld: 166 mg/dL — ABNORMAL HIGH (ref 70–99)
Potassium: 4.9 mmol/L (ref 3.5–5.1)
Sodium: 136 mmol/L (ref 135–145)

## 2019-12-10 LAB — TROPONIN I (HIGH SENSITIVITY)
Troponin I (High Sensitivity): 15 ng/L (ref <18)
Troponin I (High Sensitivity): 14 ng/L (ref <18)

## 2019-12-10 LAB — T4, FREE: Free T4: 1.07 ng/dL (ref 0.61–1.12)

## 2019-12-10 LAB — MAGNESIUM: Magnesium: 2.1 mg/dL (ref 1.7–2.4)

## 2019-12-10 LAB — TSH: TSH: 1.573 u[IU]/mL (ref 0.350–4.500)

## 2019-12-10 LAB — D-DIMER, QUANTITATIVE: D-Dimer, Quant: 1.6 ug/mL-FEU — ABNORMAL HIGH (ref 0.00–0.50)

## 2019-12-10 MED ORDER — AMIODARONE LOAD VIA INFUSION
150.0000 mg | Freq: Once | INTRAVENOUS | Status: AC
  Administered 2019-12-10: 150 mg via INTRAVENOUS
  Filled 2019-12-10: qty 83.34

## 2019-12-10 MED ORDER — APIXABAN 5 MG PO TABS
5.0000 mg | ORAL_TABLET | Freq: Two times a day (BID) | ORAL | Status: DC
  Administered 2019-12-10 – 2019-12-12 (×4): 5 mg via ORAL
  Filled 2019-12-10 (×4): qty 1

## 2019-12-10 MED ORDER — LORAZEPAM 2 MG/ML IJ SOLN
0.5000 mg | Freq: Once | INTRAMUSCULAR | Status: AC
  Administered 2019-12-10: 0.5 mg via INTRAVENOUS
  Filled 2019-12-10: qty 1

## 2019-12-10 MED ORDER — LACTATED RINGERS IV BOLUS
500.0000 mL | Freq: Once | INTRAVENOUS | Status: AC
  Administered 2019-12-10: 500 mL via INTRAVENOUS

## 2019-12-10 MED ORDER — ADULT MULTIVITAMIN W/MINERALS CH
1.0000 | ORAL_TABLET | Freq: Every day | ORAL | Status: DC
  Administered 2019-12-11 – 2019-12-12 (×2): 1 via ORAL
  Filled 2019-12-10 (×2): qty 1

## 2019-12-10 MED ORDER — FUROSEMIDE 10 MG/ML IJ SOLN
40.0000 mg | Freq: Every day | INTRAMUSCULAR | Status: DC
  Administered 2019-12-10 – 2019-12-11 (×2): 40 mg via INTRAVENOUS
  Filled 2019-12-10 (×2): qty 4

## 2019-12-10 MED ORDER — PRAVASTATIN SODIUM 40 MG PO TABS
40.0000 mg | ORAL_TABLET | Freq: Every day | ORAL | Status: DC
  Administered 2019-12-11 – 2019-12-12 (×2): 40 mg via ORAL
  Filled 2019-12-10 (×2): qty 1

## 2019-12-10 MED ORDER — CALCIUM CARBONATE-VITAMIN D 500-200 MG-UNIT PO TABS
1.0000 | ORAL_TABLET | Freq: Two times a day (BID) | ORAL | Status: DC
  Administered 2019-12-10 – 2019-12-12 (×4): 1 via ORAL
  Filled 2019-12-10 (×6): qty 1

## 2019-12-10 MED ORDER — AMIODARONE HCL IN DEXTROSE 360-4.14 MG/200ML-% IV SOLN
60.0000 mg/h | INTRAVENOUS | Status: AC
  Administered 2019-12-10: 60 mg/h via INTRAVENOUS
  Filled 2019-12-10: qty 200

## 2019-12-10 MED ORDER — ACETAMINOPHEN 325 MG PO TABS: 650.0000 mg | ORAL_TABLET | ORAL | Status: DC | PRN

## 2019-12-10 MED ORDER — IOHEXOL 350 MG/ML SOLN
80.0000 mL | Freq: Once | INTRAVENOUS | Status: AC | PRN
  Administered 2019-12-10: 80 mL via INTRAVENOUS

## 2019-12-10 MED ORDER — METOPROLOL TARTRATE 5 MG/5ML IV SOLN
5.0000 mg | INTRAVENOUS | Status: DC | PRN
  Administered 2019-12-10 (×2): 5 mg via INTRAVENOUS
  Filled 2019-12-10 (×2): qty 5

## 2019-12-10 MED ORDER — AMIODARONE HCL IN DEXTROSE 360-4.14 MG/200ML-% IV SOLN
30.0000 mg/h | INTRAVENOUS | Status: DC
  Administered 2019-12-10 – 2019-12-11 (×2): 30 mg/h via INTRAVENOUS
  Filled 2019-12-10 (×2): qty 200

## 2019-12-10 MED ORDER — LISINOPRIL 2.5 MG PO TABS
2.5000 mg | ORAL_TABLET | Freq: Every day | ORAL | Status: DC
  Filled 2019-12-10: qty 1

## 2019-12-10 MED ORDER — SODIUM CHLORIDE 0.9% FLUSH
3.0000 mL | Freq: Two times a day (BID) | INTRAVENOUS | Status: DC
  Administered 2019-12-10 – 2019-12-12 (×4): 3 mL via INTRAVENOUS

## 2019-12-10 MED ORDER — SODIUM CHLORIDE 0.9% FLUSH: 3.0000 mL | INTRAVENOUS | Status: DC | PRN

## 2019-12-10 MED ORDER — IPRATROPIUM BROMIDE 0.02 % IN SOLN
0.5000 mg | Freq: Four times a day (QID) | RESPIRATORY_TRACT | Status: DC
  Filled 2019-12-10: qty 2.5

## 2019-12-10 MED ORDER — LEVETIRACETAM 500 MG PO TABS
500.0000 mg | ORAL_TABLET | Freq: Two times a day (BID) | ORAL | Status: DC
  Administered 2019-12-10 – 2019-12-12 (×4): 500 mg via ORAL
  Filled 2019-12-10 (×4): qty 1

## 2019-12-10 MED ORDER — SODIUM CHLORIDE 0.9 % IV SOLN: 250.0000 mL | INTRAVENOUS | Status: DC | PRN

## 2019-12-10 MED ORDER — ONDANSETRON HCL 4 MG/2ML IJ SOLN: 4.0000 mg | Freq: Four times a day (QID) | INTRAMUSCULAR | Status: DC | PRN

## 2019-12-10 MED ORDER — METOPROLOL TARTRATE 50 MG PO TABS
50.0000 mg | ORAL_TABLET | Freq: Two times a day (BID) | ORAL | Status: DC
  Administered 2019-12-10 – 2019-12-11 (×2): 50 mg via ORAL
  Filled 2019-12-10 (×2): qty 1

## 2019-12-10 MED ORDER — SODIUM CHLORIDE 0.9 % IV BOLUS
1000.0000 mL | Freq: Once | INTRAVENOUS | Status: AC
  Administered 2019-12-10: 1000 mL via INTRAVENOUS

## 2019-12-10 NOTE — H&P (Signed)
History and Physical    Maureen [name] AQT:[id number] DOB: 26-Jun-1940 DOA: 12/10/2019  PCP: Mayra Neer, MD (Confirm with patient/family/NH records and if not entered, this has to be entered at [facility] point of entry) Patient coming from: Home  I have personally briefly reviewed patient's old medical records in [place]  Chief Complaint: Palpitations  HPI: Maureen [name] is a 79 y.o. female with medical history significant of status post cardioversion, nonischemic cardiomyopathy with LVEF 35 to 40% February 2020 on Lasix, thyroid nodules, hypertension, seizure disorder, OSA noncompliant with CPAP presented with worsening of shortness of breath and palpitations.  Patient reported her A. fib has not been well controlled recently, as for 2-3 weeks she has experienced frequent palpitations.  She did not seek any cardiology consultation (most recent cardiology visit was last year ) or increased her beta-blocker.  About 1 week ago, she started to feel increasing shortness of breath and wheezing.  This morning she woke up with new onset of chest pain she said it is pressure-like, constant and radiated to the left shoulder.  In time her wheezing and shortness of breath became worse.  She denied any cough no fever chills no edema. ED Course: Patient was found in rapid A. fib, received Lopressor IV push x2, blood pressure dropped.  Then patient was given 1 L of LR and started on amiodarone drip.  CT angiogram negative for PE but bilateral pleural effusion R>L.  Review of Systems: As per HPI otherwise 14 point review of systems negative.    Past Medical History:  Diagnosis Date   Atrial fibrillation (HCC)    Benign tumor of meninges (HCC)    Chronic combined systolic and diastolic CHF, NYHA class 2 and ACA/AHA stage C 01/2018   Exacerbated by A. fib; EF 35 to 40% with global HK.  Reduced cardiac output and index (3.2/1.9).   -Associated moderate pulmonary hypertension-(mean PA P 38 mmHg)    GERD (gastroesophageal reflux disease)    occ. in past   Headache(784.0)    HTN (hypertension), benign    pt. states no hypertension   NICM (nonischemic cardiomyopathy) ([place]) 01/2018   Echo with EF of 35 to 40%.  Global HK.  Mild to moderate MR.  Mild nonobstructive CAD by cath.  Cardiac output/index 3.2/1.9   Pneumonia    ? as child   PONV (postoperative nausea and vomiting)    Seizures (HCC)    positive for HBP    Past Surgical History:  Procedure Laterality Date   CARDIOVERSION N/A 02/24/2018   Procedure: CARDIOVERSION;  Surgeon: Sueanne Margarita, MD;  Location: [facility] ENDOSCOPY;  Service: Cardiovascular;  Laterality: N/A;   CATARACT EXTRACTION, BILATERAL Bilateral    CHOLECYSTECTOMY     COLONOSCOPY WITH PROPOFOL N/A 11/30/2012   Procedure: COLONOSCOPY WITH PROPOFOL;  Surgeon: Garlan Fair, MD;  Location: [facility] ENDOSCOPY;  Service: Endoscopy;  Laterality: N/A;   ESOPHAGOGASTRODUODENOSCOPY (EGD) WITH PROPOFOL N/A 11/30/2012   Procedure: ESOPHAGOGASTRODUODENOSCOPY (EGD) WITH PROPOFOL;  Surgeon: Garlan Fair, MD;  Location: [facility] ENDOSCOPY;  Service: Endoscopy;  Laterality: N/A;   ESOPHAGOGASTRODUODENOSCOPY (EGD) WITH PROPOFOL N/A 03/14/2015   Procedure: ESOPHAGOGASTRODUODENOSCOPY (EGD) WITH PROPOFOL;  Surgeon: Arta Silence, MD;  Location: [facility] ENDOSCOPY;  Service: Endoscopy;  Laterality: N/A;   EUS N/A 01/05/2013   Procedure: FULL UPPER ENDOSCOPIC ULTRASOUND (EUS) RADIAL;  Surgeon: Arta Silence, MD;  Location: [facility] ENDOSCOPY;  Service: Endoscopy;  Laterality: N/A;  EUS with possible FNA   EUS N/A 03/14/2015  Procedure: UPPER ENDOSCOPIC ULTRASOUND (EUS) RADIAL;  Surgeon: Arta Silence, MD;  Location: [facility] ENDOSCOPY;  Service: Endoscopy;  Laterality: N/A;   EYE SURGERY Left    "few weeks ago scar tissue laser surgery"   FINE NEEDLE ASPIRATION N/A 01/05/2013   Procedure: FINE NEEDLE ASPIRATION (FNA) RADIAL;  Surgeon: Arta Silence, MD;  Location: [facility] ENDOS[facility];  Service:  Endoscopy;  Laterality: N/A;   KNEE ARTHROSCOPY Left    MYELOMININGOCELE REPAIR  12/1990   RIGHT/LEFT HEART CATH AND CORONARY ANGIOGRAPHY N/A 02/22/2018   Procedure: RIGHT/LEFT HEART CATH AND CORONARY ANGIOGRAPHY;  Surgeon: Larey Dresser, MD;  Location: [facility];;  Nonobstructive CAD.  Mild to moderate pulmonary hypertension (PA P 47/24 mmHg-mean 38 mmHg.  PCWP 22 mmHg with LVEDP 24 mmHg. CO/CI = 3.2/1.9   TEE WITHOUT CARDIOVERSION N/A 02/24/2018   Procedure: TRANSESOPHAGEAL ECHOCARDIOGRAM (TEE) - WITH DCCV;  Surgeon: Sueanne Margarita, MD;  Location: [facility] ENDOSCOPY;; EF 35-40%.  Global HK.  No R WMA. Mild AI, Mild-Mod MR. Severe LA dilation (no LAA thrombus).  Severely reduced RV function.  Mild RA dilation --successful DCCV   TRANSTHORACIC ECHOCARDIOGRAM  02/18/2018   EF 35 to 40%.  Unable to assess diastolic function because of A. fib.  No obvious regional wall motion abnormality.  Global hypokinesis.  Severe LA dilation.  Mild-moderate MR.  Mild AI without AS   VAGINAL HYSTERECTOMY  1983     reports that she has never smoked. She has never used smokeless tobacco. She reports current alcohol use. She reports that she does not use drugs.  Allergies  Allergen Reactions   Dilantin [Phenytoin Sodium Extended]      Stupor, ataxia.    Hydrocodone Nausea And Vomiting    Family History  Problem Relation Age of Onset   Dementia Father        lewy body dementia   Heart disease Other        PACEMAKER DEFIB.      Prior to Admission medications   Medication Sig Start Date End Date Taking? Authorizing Provider  calcium-vitamin D (OSCAL WITH D) 500-200 MG-UNIT per tablet Take 1 tablet by mouth 2 (two) times daily.    Yes [provider]  ELIQUIS 5 MG TABS tablet TAKE 1 TABLET BY MOUTH TWICE DAILY TO REPLACE WARFARIN Patient taking differently: Take 5 mg by mouth 2 (two) times daily. To replace warfarin 06/21/19  Yes Leonie Man, MD  furosemide (LASIX) 40 MG tablet  Take 1 tablet on Mon Wed and Fri ONLY Patient taking differently: Take 40 mg by mouth as needed for fluid.  03/11/18  Yes Kilroy, Luke K, PA-C  levETIRAcetam (KEPPRA) 500 MG tablet Take 1 tablet (500 mg total) by mouth 2 (two) times daily. 06/28/19  Yes Lomax, Amy, NP  lisinopril (PRINIVIL,ZESTRIL) 2.5 MG tablet Take 1 tablet (2.5 mg total) by mouth at bedtime. 03/11/18  Yes Kilroy, Luke K, PA-C  metoprolol succinate (TOPROL-XL) 100 MG 24 hr tablet Take 1 tablet (100 mg total) by mouth daily. Take with or immediately following a meal. 02/26/18  Yes Bhagat, Bhavinkumar, PA  Multiple Vitamin (MULTIVITAMIN WITH MINERALS) TABS tablet Take 1 tablet by mouth daily.   Yes [provider]  pravastatin (PRAVACHOL) 40 MG tablet Take 40 mg by mouth daily.  05/05/13  Yes [provider]    Physical Exam: Vitals:   12/10/19 1300 12/10/19 1340 12/10/19 1415 12/10/19 1430  BP: (!) 82/69 100/84 (!) 115/98 106/71  Pulse: 95 100 Marland Kitchen)  150 (!) 108  Resp: 18 20 20 18   Temp:      TempSrc:      SpO2: 99% 98% 98% 98%  Weight:      Height:        Constitutional: NAD, calm, comfortable Vitals:   12/10/19 1300 12/10/19 1340 12/10/19 1415 12/10/19 1430  BP: (!) 82/69 100/84 (!) 115/98 106/71  Pulse: 95 100 (!) 150 (!) 108  Resp: 18 20 20 18   Temp:      TempSrc:      SpO2: 99% 98% 98% 98%  Weight:      Height:       Eyes: PERRL, lids and conjunctivae normal ENMT: Mucous membranes are moist. Posterior pharynx clear of any exudate or lesions.Normal dentition.  Neck: normal, supple, no masses, no thyromegaly Respiratory: clear to auscultation bilaterally, diffused wheezing bilaterally no crackles.  Increased respiratory effort, talking in broken sentences. No accessory muscle use.  Cardiovascular: Tachycardia and irregular rhythm, no murmurs / rubs / gallops. No extremity edema. 2+ pedal pulses. No carotid bruits.  Abdomen: no tenderness, no masses palpated. No hepatosplenomegaly. Bowel sounds  positive.  Musculoskeletal: no clubbing / cyanosis. No joint deformity upper and lower extremities. Good ROM, no contractures. Normal muscle tone.  Skin: no rashes, lesions, ulcers. No induration Neurologic: CN 2-12 grossly intact. Sensation intact, DTR normal. Strength 5/5 in all 4.  Psychiatric: Normal judgment and insight. Alert and oriented x 3. Normal mood.     Labs on Admission: I have personally reviewed following labs and imaging studies  CBC: Recent Labs  Lab 12/10/19 0921  WBC 12.1*  HGB 13.2  HCT 42.4  MCV 90.0  PLT 053*   Basic Metabolic Panel: Recent Labs  Lab 12/10/19 0921  NA 136  K 4.9  CL 103  CO2 21*  GLUCOSE 166*  BUN 48*  CREATININE 1.34*  CALCIUM 9.4  MG 2.1   GFR: Estimated Creatinine Clearance: 30.7 mL/min (A) (by C-G formula based on SCr of 1.34 mg/dL (H)). Liver Function Tests: No results for input(s): AST, ALT, ALKPHOS, BILITOT, PROT, ALBUMIN in the last 168 hours. No results for input(s): LIPASE, AMYLASE in the last 168 hours. No results for input(s): AMMONIA in the last 168 hours. Coagulation Profile: No results for input(s): INR, PROTIME in the last 168 hours. Cardiac Enzymes: No results for input(s): CKTOTAL, CKMB, CKMBINDEX, TROPONINI in the last 168 hours. BNP (last 3 results) No results for input(s): PROBNP in the last 8760 hours. HbA1C: No results for input(s): HGBA1C in the last 72 hours. CBG: No results for input(s): GLUCAP in the last 168 hours. Lipid Profile: No results for input(s): CHOL, HDL, LDLCALC, TRIG, CHOLHDL, LDLDIRECT in the last 72 hours. Thyroid Function Tests: No results for input(s): TSH, T4TOTAL, FREET4, T3FREE, THYROIDAB in the last 72 hours. Anemia Panel: No results for input(s): VITAMINB12, FOLATE, FERRITIN, TIBC, IRON, RETICCTPCT in the last 72 hours. Urine analysis: No results found for: COLORURINE, APPEARANCEUR, LABSPEC, [place], GLUCOSEU, HGBUR, BILIRUBINUR, KETONESUR, PROTEINUR, UROBILINOGEN,  NITRITE, LEUKOCYTESUR  Radiological Exams on Admission: CT Angio Chest PE W and/or Wo Contrast  Result Date: 12/10/2019 CLINICAL DATA:  Shortness of breath with elevated D-dimer. Fall 1 week ago with left-sided pain. EXAM: CT ANGIOGRAPHY CHEST CT ABDOMEN AND PELVIS WITH CONTRAST TECHNIQUE: Multidetector CT imaging of the chest was performed using the standard protocol during bolus administration of intravenous contrast. Multiplanar CT image reconstructions and MIPs were obtained to evaluate the vascular anatomy. Multidetector CT imaging of the abdomen  and pelvis was performed using the standard protocol during bolus administration of intravenous contrast. CONTRAST:  65mL OMNIPAQUE IOHEXOL 350 MG/ML SOLN COMPARISON:  CT chest 11/18/2017 and CT abdomen 09/23/2013 FINDINGS: CTA CHEST FINDINGS Cardiovascular: Heart is normal size. Mild calcified plaque over the left anterior descending coronary artery. Minimal calcified plaque over the thoracic aorta. Pulmonary arterial system is adequately opacified and demonstrates no evidence of emboli. Mediastinum/Nodes: No evidence of mediastinal or hilar adenopathy. Remaining mediastinal structures are normal. Lungs/Pleura: Lungs are adequately inflated demonstrate a moderate size right pleural effusion and small left pleural effusion with minimal associated bibasilar dependent atelectasis. Airways are unremarkable. Musculoskeletal: No focal abnormality. Degenerative changes of the spine. Review of the MIP images confirms the above findings. CT ABDOMEN and PELVIS FINDINGS Hepatobiliary: Known hemangioma slightly smaller over the right lobe of the liver. Previous cholecystectomy. Intrahepatic biliary tree is normal. Stable post cholecystectomy prominence of the common bile duct. Pancreas: Normal. Spleen: Normal. Adrenals/Urinary Tract: Adrenal glands are normal. Kidneys are normal size without hydronephrosis or nephrolithiasis. Subcentimeter hypodensity over the lower pole  cortex of the left kidney too small to characterize but likely a cyst and unchanged. Ureters and bladder are normal. Stomach/Bowel: Stomach and small bowel are normal. Moderate diverticulosis of the colon specially over the sigmoid colon. Appendix not visualized. Vascular/Lymphatic: Mild calcified plaque over the abdominal aorta without evidence of aneurysm. No adenopathy. Reproductive: Previous hysterectomy. Other: Mild-to-moderate amount of free fluid in the pelvis and inferior pericolic gutters. Musculoskeletal: No focal abnormality. Degenerative change of the spine with mild multilevel disc disease over the lumbar spine. Review of the MIP images confirms the above findings. IMPRESSION: 1. No evidence of pulmonary embolism. 2. Moderate size right pleural effusion and small left pleural effusion with associated bibasilar atelectasis. 3. No acute findings in the abdomen/pelvis. Mild-to-moderate amount of free fluid in the pelvis and inferior pericolic gutters. 4. Colonic diverticulosis. 5. Subcentimeter left renal cortical hypodensity too small to characterize, but likely a cyst and unchanged. 6. Aortic atherosclerosis. Minimal atherosclerotic coronary artery disease. 7. Known right liver hemangioma slightly smaller. Aortic Atherosclerosis (ICD10-I70.0). Electronically Signed   By: Marin Olp M.D.   On: 12/10/2019 13:12   CT ABDOMEN PELVIS W CONTRAST  Result Date: 12/10/2019 CLINICAL DATA:  Shortness of breath with elevated D-dimer. Fall 1 week ago with left-sided pain. EXAM: CT ANGIOGRAPHY CHEST CT ABDOMEN AND PELVIS WITH CONTRAST TECHNIQUE: Multidetector CT imaging of the chest was performed using the standard protocol during bolus administration of intravenous contrast. Multiplanar CT image reconstructions and MIPs were obtained to evaluate the vascular anatomy. Multidetector CT imaging of the abdomen and pelvis was performed using the standard protocol during bolus administration of intravenous  contrast. CONTRAST:  64mL OMNIPAQUE IOHEXOL 350 MG/ML SOLN COMPARISON:  CT chest 11/18/2017 and CT abdomen 09/23/2013 FINDINGS: CTA CHEST FINDINGS Cardiovascular: Heart is normal size. Mild calcified plaque over the left anterior descending coronary artery. Minimal calcified plaque over the thoracic aorta. Pulmonary arterial system is adequately opacified and demonstrates no evidence of emboli. Mediastinum/Nodes: No evidence of mediastinal or hilar adenopathy. Remaining mediastinal structures are normal. Lungs/Pleura: Lungs are adequately inflated demonstrate a moderate size right pleural effusion and small left pleural effusion with minimal associated bibasilar dependent atelectasis. Airways are unremarkable. Musculoskeletal: No focal abnormality. Degenerative changes of the spine. Review of the MIP images confirms the above findings. CT ABDOMEN and PELVIS FINDINGS Hepatobiliary: Known hemangioma slightly smaller over the right lobe of the liver. Previous cholecystectomy. Intrahepatic biliary tree is normal. Stable  post cholecystectomy prominence of the common bile duct. Pancreas: Normal. Spleen: Normal. Adrenals/Urinary Tract: Adrenal glands are normal. Kidneys are normal size without hydronephrosis or nephrolithiasis. Subcentimeter hypodensity over the lower pole cortex of the left kidney too small to characterize but likely a cyst and unchanged. Ureters and bladder are normal. Stomach/Bowel: Stomach and small bowel are normal. Moderate diverticulosis of the colon specially over the sigmoid colon. Appendix not visualized. Vascular/Lymphatic: Mild calcified plaque over the abdominal aorta without evidence of aneurysm. No adenopathy. Reproductive: Previous hysterectomy. Other: Mild-to-moderate amount of free fluid in the pelvis and inferior pericolic gutters. Musculoskeletal: No focal abnormality. Degenerative change of the spine with mild multilevel disc disease over the lumbar spine. Review of the MIP images  confirms the above findings. IMPRESSION: 1. No evidence of pulmonary embolism. 2. Moderate size right pleural effusion and small left pleural effusion with associated bibasilar atelectasis. 3. No acute findings in the abdomen/pelvis. Mild-to-moderate amount of free fluid in the pelvis and inferior pericolic gutters. 4. Colonic diverticulosis. 5. Subcentimeter left renal cortical hypodensity too small to characterize, but likely a cyst and unchanged. 6. Aortic atherosclerosis. Minimal atherosclerotic coronary artery disease. 7. Known right liver hemangioma slightly smaller. Aortic Atherosclerosis (ICD10-I70.0). Electronically Signed   By: Marin Olp M.D.   On: 12/10/2019 13:12    EKG: Independently reviewed.  Rapid A. Fib, no acute ST-T changes  Assessment/Plan Active Problems:   A-fib (HCC)   CHF (congestive heart failure) ([place])  (please populate well all problems here in Problem List. (For example, if patient is on BP meds at home and you resume or decide to hold them, it is a problem that needs to be her. Same for CAD, COPD, HLD and so on)  A. fib with RVR -Has history of refractory A. fib status post cardioversion x1 last year.  Appears to become poorly controlled again recently, which likely was made worse with the use of albuterol. -It appears that the patient was on amiodarone last year which was discontinued after cardioversion.  Not sure whether patient failed amiodarone before.  As patient was presented to IV beta-blocker pushes in the ED, amiodarone drip was started in the ED. consult cardiology, discussed with on-call cardiology did not admission regarding rate control medications, chronic stable see patient and decide. -Continue Eliquis -Check TSH, T4 and T3, given the longstanding history of goiter  Acute on chronic systolic CHF decompensation -Postponed echo to tomorrow once heart rate more controlled -New onset wheezing appears to be related to heart failure resident lung  etiology, as she does not have history of COPD or asthma. -Atropine as needed -Cardiology help [place] to decide when to start diuresis -Chronic systolic failure likely secondary to nonischemic cardiomyopathy as patient had a normal left-sided cardiac cath last year.  HTN -Hold ACEI given hypotensive episode in ED  Seizure disorder -Keppra  DVT prophylaxis: Eliquis Code Status: Full code Family Communication: None at bedside Disposition Plan: Expect 1 to 2 days hospital stay for amiodarone loading versus other rate control medications versus cardioversion as patient has history of refractory A. fib.  PT evaluation start from tomorrow Consults called: Cardiology Admission status: PCU   Lequita Halt MD [facility] Pager [phone] 12/10/2019, 3:08 PM

## 2019-12-10 NOTE — Consult Note (Addendum)
Cardiology Consultation:   Patient ID: [name]; [id number]; 10-10-1940   Admit date: 12/10/2019 Date of Consult: 12/10/2019  Primary Care Provider: Mayra Neer, MD Primary Cardiologist: Dr. Glenetta Hew, MD  Patient Profile:   Maureen [name] is a 79 y.o. female with a hx of atrial fibrillation with RVR, HTN, and seizure disorder who is being seen today for the evaluation of atrial fibrillation with RVR at the request of Dr. Roosevelt Locks.  History of Present Illness:   Maureen [name] is an 78 year old female with a history as stated above who presented to [facility] on 12/10/2019 with complaints of palpitations and shortness of breath. Per patient's report, atrial fibrillation has not been well controlled over the last several weeks as she has been experiencing increased frequency of palpitations and racing heart.  This morning, she reports that she woke up and was having difficulty breathing with new onset chest pressure which was worse with inspiration.  She denies LE edema, weight gain however has had orthopnea symptoms.  She has also had wheezing for which she used her husband nebulizer treatment with albuterol without much relief.  She reports medication compliance with Eliquis for anticoagulation along with lisinopril and Toprol for cardiomyopathy.  Given her symptoms, she presented to the ED for further evaluation  In the ED, patient was found to be in atrial fibrillation with RVR.  She was given IV Lopressor x2 however her BP dropped and was therefore given 1 L IVF and started on amiodarone infusion.  D-dimer was elevated at 1.6 with CT angiogram negative for PE however did show bilateral pleural effusions.  Creatinine at 1.34.  HST found to be 15 with a repeat at 14, not consistent with ACS.  CT notes minimal atherosclerotic coronary artery disease.  She initially was seen in cardiology consultation 01/2018 for acute on chronic combined systolic and diastolic heart failure with atrial  fibrillation with RVR.  Episode felt to be secondary to stress related to taking care of her demented husband.  Echocardiogram at that time showed an LVEF of 35 to 40% with severe dilated left atrium felt to be nonischemic cardiomyopathy confirmed by LHC with normal coronary arteries.  She eventually had a TEE cardioversion 02/24/2018 and was treated with Coumadin until she was fully weaned off Tegretol and converted to [place].  Plan was then to convert to [place] after 6 months of Coumadin to assure that she was stable on Keppra and off Tegretol.  In follow-up to 2020 she was maintaining sinus rhythm with no CHF symptoms.  ASA 81 was stopped and Lasix was cut back to 3 times weekly.  She had some complaints of fatigue therefore it was considered to transition from Toprol to carvedilol.  On last follow-up with Dr. Ellyn Hack 05/03/2018, she had no further episodes of palpitations.  She was continued on low-dose ACEI plus Toprol for cardiomyopathy with plans for follow-up echocardiogram.  He does not appear that follow-up echocardiogram was ever completed.  We have not seen her since this time.  Past Medical History:  Diagnosis Date  . Atrial fibrillation ([place])   . Benign tumor of meninges ([place])   . Chronic combined systolic and diastolic CHF, NYHA class 2 and ACA/AHA stage C 01/2018   Exacerbated by A. fib; EF 35 to 40% with global HK.  Reduced cardiac output and index (3.2/1.9).   -Associated moderate pulmonary hypertension-(mean PA P 38 mmHg)  . GERD (gastroesophageal reflux disease)    occ. in past  . Headache(784.0)   .  HTN (hypertension), benign    pt. states no hypertension  . NICM (nonischemic cardiomyopathy) ([place]) 01/2018   Echo with EF of 35 to 40%.  Global HK.  Mild to moderate MR.  Mild nonobstructive CAD by cath.  Cardiac output/index 3.2/1.9  . Pneumonia    ? as child  . PONV (postoperative nausea and vomiting)   . Seizures ([place])    positive for HBP    Past Surgical History:  Procedure  Laterality Date  . CARDIOVERSION N/A 02/24/2018   Procedure: CARDIOVERSION;  Surgeon: Sueanne Margarita, MD;  Location: [facility] ENDOSCOPY;  Service: Cardiovascular;  Laterality: N/A;  . CATARACT EXTRACTION, BILATERAL Bilateral   . CHOLECYSTECTOMY    . COLONOSCOPY WITH PROPOFOL N/A 11/30/2012   Procedure: COLONOSCOPY WITH PROPOFOL;  Surgeon: Garlan Fair, MD;  Location: [facility] ENDOSCOPY;  Service: Endoscopy;  Laterality: N/A;  . ESOPHAGOGASTRODUODENOSCOPY (EGD) WITH PROPOFOL N/A 11/30/2012   Procedure: ESOPHAGOGASTRODUODENOSCOPY (EGD) WITH PROPOFOL;  Surgeon: Garlan Fair, MD;  Location: [facility] ENDOSCOPY;  Service: Endoscopy;  Laterality: N/A;  . ESOPHAGOGASTRODUODENOSCOPY (EGD) WITH PROPOFOL N/A 03/14/2015   Procedure: ESOPHAGOGASTRODUODENOSCOPY (EGD) WITH PROPOFOL;  Surgeon: Arta Silence, MD;  Location: [facility] ENDOSCOPY;  Service: Endoscopy;  Laterality: N/A;  . EUS N/A 01/05/2013   Procedure: FULL UPPER ENDOSCOPIC ULTRASOUND (EUS) RADIAL;  Surgeon: Arta Silence, MD;  Location: [facility] ENDOSCOPY;  Service: Endoscopy;  Laterality: N/A;  EUS with possible FNA  . EUS N/A 03/14/2015   Procedure: UPPER ENDOSCOPIC ULTRASOUND (EUS) RADIAL;  Surgeon: Arta Silence, MD;  Location: [facility] ENDOSCOPY;  Service: Endoscopy;  Laterality: N/A;  . EYE SURGERY Left    "few weeks ago scar tissue laser surgery"  . FINE NEEDLE ASPIRATION N/A 01/05/2013   Procedure: FINE NEEDLE ASPIRATION (FNA) RADIAL;  Surgeon: Arta Silence, MD;  Location: [facility] ENDOSCOPY;  Service: Endoscopy;  Laterality: N/A;  . KNEE ARTHROSCOPY Left   . [place]  12/1990  . RIGHT/LEFT HEART CATH AND CORONARY ANGIOGRAPHY N/A 02/22/2018   Procedure: RIGHT/LEFT HEART CATH AND CORONARY ANGIOGRAPHY;  Surgeon: Larey Dresser, MD;  Location: [facility] INVASIVE CV LAB;;  Nonobstructive CAD.  Mild to moderate pulmonary hypertension (PA P 47/24 mmHg-mean 38 mmHg.  PCWP 22 mmHg with LVEDP 24 mmHg. CO/CI = 3.2/1.9  . TEE WITHOUT CARDIOVERSION N/A 02/24/2018    Procedure: TRANSESOPHAGEAL ECHOCARDIOGRAM (TEE) - WITH DCCV;  Surgeon: Sueanne Margarita, MD;  Location: [facility] ENDOSCOPY;; EF 35-40%.  Global HK.  No R WMA. Mild AI, Mild-Mod MR. Severe LA dilation (no LAA thrombus).  Severely reduced RV function.  Mild RA dilation --successful DCCV  . TRANSTHORACIC ECHOCARDIOGRAM  02/18/2018   EF 35 to 40%.  Unable to assess diastolic function because of A. fib.  No obvious regional wall motion abnormality.  Global hypokinesis.  Severe LA dilation.  Mild-moderate MR.  Mild AI without AS  . VAGINAL HYSTERECTOMY  1983     Prior to Admission medications   Medication Sig Start Date End Date Taking? Authorizing Provider  calcium-vitamin D (OSCAL WITH D) 500-200 MG-UNIT per tablet Take 1 tablet by mouth 2 (two) times daily.    Yes [provider]  ELIQUIS 5 MG TABS tablet TAKE 1 TABLET BY MOUTH TWICE DAILY TO REPLACE WARFARIN Patient taking differently: Take 5 mg by mouth 2 (two) times daily. To replace warfarin 06/21/19  Yes Leonie Man, MD  furosemide (LASIX) 40 MG tablet Take 1 tablet on Mon Wed and Fri ONLY Patient taking differently: Take 40 mg by mouth as needed  for fluid.  03/11/18  Yes Kilroy, Luke K, PA-C  levETIRAcetam (KEPPRA) 500 MG tablet Take 1 tablet (500 mg total) by mouth 2 (two) times daily. 06/28/19  Yes Lomax, Amy, NP  lisinopril (PRINIVIL,ZESTRIL) 2.5 MG tablet Take 1 tablet (2.5 mg total) by mouth at bedtime. 03/11/18  Yes Kilroy, Luke K, PA-C  metoprolol succinate (TOPROL-XL) 100 MG 24 hr tablet Take 1 tablet (100 mg total) by mouth daily. Take with or immediately following a meal. 02/26/18  Yes Bhagat, Bhavinkumar, PA  Multiple Vitamin (MULTIVITAMIN WITH MINERALS) TABS tablet Take 1 tablet by mouth daily.   Yes [provider]  pravastatin (PRAVACHOL) 40 MG tablet Take 40 mg by mouth daily.  05/05/13  Yes [provider]    Inpatient Medications: Scheduled Meds: . apixaban  5 mg Oral BID  . calcium-vitamin D  1 tablet Oral  BID  . ipratropium  0.5 mg Nebulization Q6H  . levETIRAcetam  500 mg Oral BID  . lisinopril  2.5 mg Oral QHS  . metoprolol tartrate  50 mg Oral BID  . multivitamin with minerals  1 tablet Oral Daily  . pravastatin  40 mg Oral Daily  . sodium chloride flush  3 mL Intravenous Q12H   Continuous Infusions: . sodium chloride    . amiodarone 60 mg/hr (12/10/19 1423)   Followed by  . amiodarone     PRN Meds: sodium chloride, acetaminophen, metoprolol tartrate, ondansetron (ZOFRAN) IV, sodium chloride flush  Allergies:    Allergies  Allergen Reactions  . Dilantin [Phenytoin Sodium Extended]      Stupor, ataxia.   Marland Kitchen Hydrocodone Nausea And Vomiting    Social History:   Social History   Socioeconomic History  . Marital status: Married    Spouse name: Jan  . Number of children: 2  . Years of education: Master's  . Highest education level: Not on file  Occupational History  . Occupation: retired    Comment: [employer] in [place]: RETIRED  Tobacco Use  . Smoking status: Never Smoker  . Smokeless tobacco: Never Used  Vaping Use  . Vaping Use: Never used  Substance and Sexual Activity  . Alcohol use: Yes    Alcohol/week: 0.0 standard drinks    Comment: once monthly  . Drug use: No  . Sexual activity: Not on file  Other Topics Concern  . Not on file  Social History Narrative   She retired in 1995 from [employer]. Lives at home with her husband, Jan. They have a son age 34 and a  son age 36. Cafffeine once daily. a week and alcohol once a month. Denies tobacco and illicit drug use.    Patient is left handed.   Patient has a Master's degree.   Social Determinants of Health   Financial Resource Strain:   . Difficulty of Paying Living Expenses: Not on file  Food Insecurity:   . Worried About Charity fundraiser in the Last Year: Not on file  . Ran Out of Food in the Last Year: Not on file  Transportation Needs:   . Lack of Transportation  (Medical): Not on file  . Lack of Transportation (Non-Medical): Not on file  Physical Activity:   . Days of Exercise per Week: Not on file  . Minutes of Exercise per Session: Not on file  Stress:   . Feeling of Stress : Not on file  Social Connections:   . Frequency of Communication with  Friends and Family: Not on file  . Frequency of Social Gatherings with Friends and Family: Not on file  . Attends Religious Services: Not on file  . Active Member of Clubs or Organizations: Not on file  . Attends Archivist Meetings: Not on file  . Marital Status: Not on file  Intimate Partner Violence:   . Fear of Current or Ex-Partner: Not on file  . Emotionally Abused: Not on file  . Physically Abused: Not on file  . Sexually Abused: Not on file    Family History:   Family History  Problem Relation Age of Onset  . Dementia Father        lewy body dementia  . Heart disease Other        PACEMAKER DEFIB.     Family Status:  Family Status  Relation Name Status  . Mother  Deceased at age 45       pancreatic cancer  . Father  Deceased at age 19       alzheimer's  . Other HUSBAND (Not Specified)    ROS:  Please see the history of present illness.  All other ROS reviewed and negative.     Physical Exam/Data:   Vitals:   12/10/19 1300 12/10/19 1340 12/10/19 1415 12/10/19 1430  BP: (!) 82/69 100/84 (!) 115/98 106/71  Pulse: 95 100 (!) 150 (!) 108  Resp: 18 20 20 18   Temp:      TempSrc:      SpO2: 99% 98% 98% 98%  Weight:      Height:        Intake/Output Summary (Last 24 hours) at 12/10/2019 1457 Last data filed at 12/10/2019 1425 Gross per 24 hour  Intake 2000 ml  Output --  Net 2000 ml   Filed Weights   12/10/19 0910  Weight: 72.6 kg   Body mass index is 29.26 kg/m.   General: Well developed, well nourished, NAD Neck: Negative for carotid bruits. No JVD Lungs: Diminished in bilateral lower lobes, wheezing on expiration. Breathing is labored with  speaking. Cardiovascular: Irregularly irregular. No murmurs, rubs, gallops, or LV heave appreciated. Abdomen: Soft, non-tender, non-distended. No obvious abdominal masses. Extremities: No edema.  Radial pulses 2+ bilaterally Neuro: Alert and oriented. No focal deficits. No facial asymmetry. MAE spontaneously. Psych: Responds to questions appropriately with normal affect.     EKG:  The EKG was personally reviewed and demonstrates: 12/10/2019 atrial fibrillation, HR 148 bpm Telemetry:  Telemetry was personally reviewed and demonstrates: 12/10/2019 atrial fibrillation with rates in the 100-120 BPM range  Relevant CV Studies:  Echocardiogram 02/19/2018:   1. The left ventricle has moderately reduced systolic function of 02-54%.  The cavity size is normal. There is no left ventricular wall thickness.  Echo evidence of Unable to assess diastolic filling patterns.  2. The right ventricle is in size. There is severely reduced systolic  function.  3. Severely dilated left atrial size.  4. Mildly dilated right atrial size.  5. The mitral valve normal in structure. Regurgitation is mild to  moderate by color flow Doppler.  6. Normal tricuspid valve.  7. Tricuspid regurgitation is mild.  8. The aortic valve tricuspid. Aortic valve regurgitation is mild by  color flow Doppler.  9. Pulmonic valve regurgitation is mild by color flow Doppler.  10. No atrial level shunt detected by color flow Doppler.   R/LHC 02/22/2018:   Elevated right and left heart filling pressures.  2. Low cardiac output.  3. Mild-moderate  pulmonary hypertension.  4. Nonobstructive CAD.   TEE cardioversion 02/24/2018:  Left ventricle: Systolic function was moderately reduced. The  estimated ejection fraction was in the range of 35% to 40%. Wall  motion was normal; there were no regional wall motion  abnormalities.  - Aortic valve: There was mild regurgitation.  - Mitral valve: There was mild to moderate  regurgitation.  - Left atrium: The atrium was severely dilated. No evidence of  thrombus in the atrial cavity or appendage.  - Right ventricle: Systolic function was severely reduced.  - Right atrium: The atrium was mildly dilated. No evidence of  thrombus in the atrial cavity or appendage.   Laboratory Data:  Chemistry Recent Labs  Lab 12/10/19 0921  NA 136  K 4.9  CL 103  CO2 21*  GLUCOSE 166*  BUN 48*  CREATININE 1.34*  CALCIUM 9.4  GFRNONAA 40*  ANIONGAP 12    Total Protein  Date Value Ref Range Status  08/31/2017 6.4 6.0 - 8.5 g/dL Final   Albumin  Date Value Ref Range Status  08/31/2017 4.5 3.5 - 4.8 g/dL Final   AST  Date Value Ref Range Status  08/31/2017 19 0 - 40 IU/L Final   ALT  Date Value Ref Range Status  08/31/2017 19 0 - 32 IU/L Final   Alkaline Phosphatase  Date Value Ref Range Status  08/31/2017 88 39 - 117 IU/L Final   Bilirubin Total  Date Value Ref Range Status  08/31/2017 <0.2 0.0 - 1.2 mg/dL Final   Hematology Recent Labs  Lab 12/10/19 0921  WBC 12.1*  RBC 4.71  HGB 13.2  HCT 42.4  MCV 90.0  MCH 28.0  MCHC 31.1  RDW 14.4  PLT 410*   Cardiac EnzymesNo results for input(s): TROPONINI in the last 168 hours. No results for input(s): TROPIPOC in the last 168 hours.  BNPNo results for input(s): BNP, PROBNP in the last 168 hours.  DDimer  Recent Labs  Lab 12/10/19 0921  DDIMER 1.60*   TSH:  Lab Results  Component Value Date   TSH 3.211 02/17/2018   Lipids: Lab Results  Component Value Date   CHOL 144 02/18/2018   HDL 55 02/18/2018   LDLCALC 75 02/18/2018   TRIG 71 02/18/2018   CHOLHDL 2.6 02/18/2018   HgbA1c:No results found for: HGBA1C  Radiology/Studies:  CT Angio Chest PE W and/or Wo Contrast  Result Date: 12/10/2019 CLINICAL DATA:  Shortness of breath with elevated D-dimer. Fall 1 week ago with left-sided pain. EXAM: CT ANGIOGRAPHY CHEST CT ABDOMEN AND PELVIS WITH CONTRAST TECHNIQUE: Multidetector CT  imaging of the chest was performed using the standard protocol during bolus administration of intravenous contrast. Multiplanar CT image reconstructions and MIPs were obtained to evaluate the vascular anatomy. Multidetector CT imaging of the abdomen and pelvis was performed using the standard protocol during bolus administration of intravenous contrast. CONTRAST:  91mL OMNIPAQUE IOHEXOL 350 MG/ML SOLN COMPARISON:  CT chest 11/18/2017 and CT abdomen 09/23/2013 FINDINGS: CTA CHEST FINDINGS Cardiovascular: Heart is normal size. Mild calcified plaque over the left anterior descending coronary artery. Minimal calcified plaque over the thoracic aorta. Pulmonary arterial system is adequately opacified and demonstrates no evidence of emboli. Mediastinum/Nodes: No evidence of mediastinal or hilar adenopathy. Remaining mediastinal structures are normal. Lungs/Pleura: Lungs are adequately inflated demonstrate a moderate size right pleural effusion and small left pleural effusion with minimal associated bibasilar dependent atelectasis. Airways are unremarkable. Musculoskeletal: No focal abnormality. Degenerative changes of the spine. Review of the  MIP images confirms the above findings. CT ABDOMEN and PELVIS FINDINGS Hepatobiliary: Known hemangioma slightly smaller over the right lobe of the liver. Previous cholecystectomy. Intrahepatic biliary tree is normal. Stable post cholecystectomy prominence of the common bile duct. Pancreas: Normal. Spleen: Normal. Adrenals/Urinary Tract: Adrenal glands are normal. Kidneys are normal size without hydronephrosis or nephrolithiasis. Subcentimeter hypodensity over the lower pole cortex of the left kidney too small to characterize but likely a cyst and unchanged. Ureters and bladder are normal. Stomach/Bowel: Stomach and small bowel are normal. Moderate diverticulosis of the colon specially over the sigmoid colon. Appendix not visualized. Vascular/Lymphatic: Mild calcified plaque over the  abdominal aorta without evidence of aneurysm. No adenopathy. Reproductive: Previous hysterectomy. Other: Mild-to-moderate amount of free fluid in the pelvis and inferior pericolic gutters. Musculoskeletal: No focal abnormality. Degenerative change of the spine with mild multilevel disc disease over the lumbar spine. Review of the MIP images confirms the above findings. IMPRESSION: 1. No evidence of pulmonary embolism. 2. Moderate size right pleural effusion and small left pleural effusion with associated bibasilar atelectasis. 3. No acute findings in the abdomen/pelvis. Mild-to-moderate amount of free fluid in the pelvis and inferior pericolic gutters. 4. Colonic diverticulosis. 5. Subcentimeter left renal cortical hypodensity too small to characterize, but likely a cyst and unchanged. 6. Aortic atherosclerosis. Minimal atherosclerotic coronary artery disease. 7. Known right liver hemangioma slightly smaller. Aortic Atherosclerosis (ICD10-I70.0). Electronically Signed   By: Marin Olp M.D.   On: 12/10/2019 13:12   CT ABDOMEN PELVIS W CONTRAST  Result Date: 12/10/2019 CLINICAL DATA:  Shortness of breath with elevated D-dimer. Fall 1 week ago with left-sided pain. EXAM: CT ANGIOGRAPHY CHEST CT ABDOMEN AND PELVIS WITH CONTRAST TECHNIQUE: Multidetector CT imaging of the chest was performed using the standard protocol during bolus administration of intravenous contrast. Multiplanar CT image reconstructions and MIPs were obtained to evaluate the vascular anatomy. Multidetector CT imaging of the abdomen and pelvis was performed using the standard protocol during bolus administration of intravenous contrast. CONTRAST:  57mL OMNIPAQUE IOHEXOL 350 MG/ML SOLN COMPARISON:  CT chest 11/18/2017 and CT abdomen 09/23/2013 FINDINGS: CTA CHEST FINDINGS Cardiovascular: Heart is normal size. Mild calcified plaque over the left anterior descending coronary artery. Minimal calcified plaque over the thoracic aorta. Pulmonary  arterial system is adequately opacified and demonstrates no evidence of emboli. Mediastinum/Nodes: No evidence of mediastinal or hilar adenopathy. Remaining mediastinal structures are normal. Lungs/Pleura: Lungs are adequately inflated demonstrate a moderate size right pleural effusion and small left pleural effusion with minimal associated bibasilar dependent atelectasis. Airways are unremarkable. Musculoskeletal: No focal abnormality. Degenerative changes of the spine. Review of the MIP images confirms the above findings. CT ABDOMEN and PELVIS FINDINGS Hepatobiliary: Known hemangioma slightly smaller over the right lobe of the liver. Previous cholecystectomy. Intrahepatic biliary tree is normal. Stable post cholecystectomy prominence of the common bile duct. Pancreas: Normal. Spleen: Normal. Adrenals/Urinary Tract: Adrenal glands are normal. Kidneys are normal size without hydronephrosis or nephrolithiasis. Subcentimeter hypodensity over the lower pole cortex of the left kidney too small to characterize but likely a cyst and unchanged. Ureters and bladder are normal. Stomach/Bowel: Stomach and small bowel are normal. Moderate diverticulosis of the colon specially over the sigmoid colon. Appendix not visualized. Vascular/Lymphatic: Mild calcified plaque over the abdominal aorta without evidence of aneurysm. No adenopathy. Reproductive: Previous hysterectomy. Other: Mild-to-moderate amount of free fluid in the pelvis and inferior pericolic gutters. Musculoskeletal: No focal abnormality. Degenerative change of the spine with mild multilevel disc disease over the lumbar spine.  Review of the MIP images confirms the above findings. IMPRESSION: 1. No evidence of pulmonary embolism. 2. Moderate size right pleural effusion and small left pleural effusion with associated bibasilar atelectasis. 3. No acute findings in the abdomen/pelvis. Mild-to-moderate amount of free fluid in the pelvis and inferior pericolic gutters. 4.  Colonic diverticulosis. 5. Subcentimeter left renal cortical hypodensity too small to characterize, but likely a cyst and unchanged. 6. Aortic atherosclerosis. Minimal atherosclerotic coronary artery disease. 7. Known right liver hemangioma slightly smaller. Aortic Atherosclerosis (ICD10-I70.0). Electronically Signed   By: Marin Olp M.D.   On: 12/10/2019 13:12   Assessment and Plan:   1.  Paroxysmal atrial fibrillation with RVR: -Patient initially seen in cardiology consultation 01/2018 for atrial fibrillation with RVR and acute systolic CHF.  Echocardiogram at that time showed an LVEF at 35 to 40% with severe dilated left atrium felt to be nonischemic cardiomyopathy per LHC with normal coronary arteries 02/2018.  She eventually underwent TEE cardioversion 02/2018 and was treated with Coumadin until she was able to be fully weaned off Tegretol and converted to [place]. Plan was for conversion to [place] after roughly 6 months of Coumadin therapy to ensure that she was stable on Keppra and off Tegretol.  She was seen on two separate occasions in OP follow-up and was maintaining NSR on Toprol XL.  Plan was for repeat echocardiogram however does not appear that this was performed. -She then presented today with a several week history of increasing shortness of breath and palpitations found to be in atrial fibrillation with RVR.  She was placed on IV amiodarone and remains in AF with rates in the 100-120 range.  She reports no missed doses of anticoagulation>> currently on Eliquis 5 mg twice daily -Plan for follow-up echocardiogram this admission  -May need rebolused with amiodarone.   -Given effusions on CTA, would benefit from IV Lasix -Trend weight, I&O -Continue metoprolol 50 mg twice daily -Plan to load with amiodarone over the weekend and if remains in Afib, will plan DCCV on Monday  2.  Nonischemic cardiomyopathy: -Echocardiogram from 01/2018 with LVEF at 35 to 40% felt to be tachycardia mediated  versus viral etiology. -LHC performed 02/22/2018 which showed normal coronary arteries -Plan was for low-dose ACEI and beta-blocker therapy with repeat echocardiogram several months later however it does not appear this has been performed -She has been lost to follow-up since this time -Patient reports compliance with lisinopril, Toprol-XL -Repeat echocardiogram  3.  Acute on chronic combined systolic and diastolic heart failure: -At last follow-up, Lasix was reduced to 3 times weekly given that she was fluid volume stable however patient reports that she is been taking Lasix on a as needed basis -Last echocardiogram with LVEF at 35 to 40% -Repeat echocardiogram -CTA on presentation with bilateral pleural effusions -Would start IV Lasix 40 mg twice daily and follow response -Monitor daily weight, strict I&O -Likely secondary to #1  4.  HLD: -Last LDL, 75 on 02/18/2018 -Continue pravastatin  5.  Seizure disorder: -Maintained on Keppra -Management per IM  6.  Wheezing: -Has expiratory wheezing on exam -Reports she was using her husband's nebulizer with albuterol prior to admission without much response -Currently on Atrovent   For questions or updates, please contact [place] Please consult [email or web address] for contact info under Cardiology/STEMI.   Lyndel Safe NP-[facility] Pager: [phone] 12/10/2019 2:57 PM   Patient seen and examined.  Agree with above documentation.  Maureen [name] is an 79 year old female with a history  of chronic combined systolic and diastolic heart failure, atrial fibrillation with RVR, hypertension, seizures who is being seen today for evaluation of atrial fibrillation at the request of Dr. Roosevelt Locks.  She was diagnosed with systolic/diastolic heart failure in AF with RVR in January 2020.  Echocardiogram at that time showed LVEF 35 to 40%.  Underwent left heart catheterization which showed normal coronary arteries.  In February 2020 she  underwent TEE/DCCV, which was successful in restoring sinus rhythm.  She was initially anticoagulated with Coumadin until she was weaned off Tegretol and converted to [place] for her seizures.  She was subsequently switched from Coumadin to Eliquis.  She last saw Dr. Ellyn Hack in April 2020, had planned to repeat echocardiogram to monitor for improvement since restoration of sinus rhythm.  She was subsequently lost to follow-up.  She does report though that she has been compliant with her Eliquis, denies any missed doses in last 3 weeks.  She reports that she has been having palpitations and shortness of breath over the last week.  She reported symptoms were worse this morning, prompting her to present to the ED.  In the ED, she was noted to have pulse up to 140s, BP 114/73, SPO2 100% on room air.  Labs notable for creatinine 1.3 (increased from 0.9), sodium 136, potassium 4.9, troponin 15 >14, WBC 12.1, hemoglobin 13.2, platelets 410, D-dimer 1.6, TSH 1.6.  EKG personally reviewed and shows atrial fibrillation, rate 148, poor R wave progression.  Telemetry personally reviewed and shows atrial fibrillation with rate 140s to 150s initially, with rates currently improved to 110s to 120s.  On exam, patient is alert and oriented, irregular rhythm, tachycardic, no murmurs, diffuse expiratory wheezing, no LE edema.  For her atrial fibrillation, would continue Eliquis 5 mg twice daily for anticoagulation.  Continue metoprolol 50 mg twice daily for rate control.  We will plan to load with amiodarone over the weekend and if remains in AF, plan cardioversion on Monday.  For her acute on chronic combined systolic and diastolic heart failure, will diurese with IV Lasix 40 mg twice daily.  Donato Heinz, MD

## 2019-12-10 NOTE — Progress Notes (Signed)
Admitted to room, self transferred to bed with standby, tele monitor applied, IV infusing without difficulty.  No skin breakdown noted, no dyspnea, denies pain.  O2@2L  per [facility] in place.  Offered snack, she would like to wait for meal tray.  Resting in bed without complaint at this time.

## 2019-12-10 NOTE — ED Provider Notes (Signed)
[facility] EMERGENCY DEPARTMENT Provider Note   CSN: [id number] Arrival date & time: 12/10/19  4098     History Chief Complaint  Patient presents with  . Shortness of Breath    Maureen [name] is a 79 y.o. female.   Shortness of Breath Severity:  Moderate Onset quality:  Gradual Timing:  Intermittent Progression:  Worsening Chronicity:  New Relieved by:  Nothing Worsened by:  Nothing Ineffective treatments:  None tried Associated symptoms: chest pain and cough (now resolved)   Associated symptoms: no fever, no headaches, no rash, no sputum production and no vomiting        Past Medical History:  Diagnosis Date  . Atrial fibrillation ([place])   . Benign tumor of meninges ([place])   . Chronic combined systolic and diastolic CHF, NYHA class 2 and ACA/AHA stage C 01/2018   Exacerbated by A. fib; EF 35 to 40% with global HK.  Reduced cardiac output and index (3.2/1.9).   -Associated moderate pulmonary hypertension-(mean PA P 38 mmHg)  . GERD (gastroesophageal reflux disease)    occ. in past  . Headache(784.0)   . HTN (hypertension), benign    pt. states no hypertension  . NICM (nonischemic cardiomyopathy) ([place]) 01/2018   Echo with EF of 35 to 40%.  Global HK.  Mild to moderate MR.  Mild nonobstructive CAD by cath.  Cardiac output/index 3.2/1.9  . Pneumonia    ? as child  . PONV (postoperative nausea and vomiting)   . Seizures ([place])    positive for HBP    Patient Active Problem List   Diagnosis Date Noted  . CHF (congestive heart failure) ([place]) 12/10/2019  . OSA (obstructive sleep apnea) 03/26/2018  . Chronic combined systolic and diastolic heart failure ([place]) 03/11/2018  . Normal coronary arteries 03/11/2018  . NICM (nonischemic cardiomyopathy) ([place]) 03/11/2018  . Long term (current) use of anticoagulants 03/01/2018  . A-fib ([place]) 02/17/2018  . Epistaxis 09/17/2017  . Partial symptomatic epilepsy with complex partial seizures, not intractable,  without status epilepticus ([place]) 11/10/2016  . Incongruous diplopia 11/10/2016  . Benign neoplasm of brain ([place]) 11/10/2016  . Seizure disorder, focal motor ([place]) 05/26/2013  . Medication monitoring encounter 05/26/2013  . Meningioma ([place]) 05/26/2012  . Seizures ([place]) 05/26/2012  . Focal (motor) epilepsy ([place]) 05/26/2012  . Essential hypertension     Past Surgical History:  Procedure Laterality Date  . CARDIOVERSION N/A 02/24/2018   Procedure: CARDIOVERSION;  Surgeon: Sueanne Margarita, MD;  Location: [facility] ENDOSCOPY;  Service: Cardiovascular;  Laterality: N/A;  . CATARACT EXTRACTION, BILATERAL Bilateral   . CHOLECYSTECTOMY    . COLONOSCOPY WITH PROPOFOL N/A 11/30/2012   Procedure: COLONOSCOPY WITH PROPOFOL;  Surgeon: Garlan Fair, MD;  Location: [facility] ENDOSCOPY;  Service: Endoscopy;  Laterality: N/A;  . ESOPHAGOGASTRODUODENOSCOPY (EGD) WITH PROPOFOL N/A 11/30/2012   Procedure: ESOPHAGOGASTRODUODENOSCOPY (EGD) WITH PROPOFOL;  Surgeon: Garlan Fair, MD;  Location: [facility] ENDOSCOPY;  Service: Endoscopy;  Laterality: N/A;  . ESOPHAGOGASTRODUODENOSCOPY (EGD) WITH PROPOFOL N/A 03/14/2015   Procedure: ESOPHAGOGASTRODUODENOSCOPY (EGD) WITH PROPOFOL;  Surgeon: Arta Silence, MD;  Location: [facility] ENDOSCOPY;  Service: Endoscopy;  Laterality: N/A;  . EUS N/A 01/05/2013   Procedure: FULL UPPER ENDOSCOPIC ULTRASOUND (EUS) RADIAL;  Surgeon: Arta Silence, MD;  Location: [facility] ENDOSCOPY;  Service: Endoscopy;  Laterality: N/A;  EUS with possible FNA  . EUS N/A 03/14/2015   Procedure: UPPER ENDOSCOPIC ULTRASOUND (EUS) RADIAL;  Surgeon: Arta Silence, MD;  Location: [facility] ENDOSCOPY;  Service: Endoscopy;  Laterality: N/A;  .  EYE SURGERY Left    "few weeks ago scar tissue laser surgery"  . FINE NEEDLE ASPIRATION N/A 01/05/2013   Procedure: FINE NEEDLE ASPIRATION (FNA) RADIAL;  Surgeon: Arta Silence, MD;  Location: [facility] ENDOSCOPY;  Service: Endoscopy;  Laterality: N/A;  . KNEE ARTHROSCOPY Left   . [place]  12/1990  . RIGHT/LEFT HEART CATH AND CORONARY ANGIOGRAPHY N/A 02/22/2018   Procedure: RIGHT/LEFT HEART CATH AND CORONARY ANGIOGRAPHY;  Surgeon: Larey Dresser, MD;  Location: [facility] INVASIVE CV LAB;;  Nonobstructive CAD.  Mild to moderate pulmonary hypertension (PA P 47/24 mmHg-mean 38 mmHg.  PCWP 22 mmHg with LVEDP 24 mmHg. CO/CI = 3.2/1.9  . TEE WITHOUT CARDIOVERSION N/A 02/24/2018   Procedure: TRANSESOPHAGEAL ECHOCARDIOGRAM (TEE) - WITH DCCV;  Surgeon: Sueanne Margarita, MD;  Location: [facility] ENDOSCOPY;; EF 35-40%.  Global HK.  No R WMA. Mild AI, Mild-Mod MR. Severe LA dilation (no LAA thrombus).  Severely reduced RV function.  Mild RA dilation --successful DCCV  . TRANSTHORACIC ECHOCARDIOGRAM  02/18/2018   EF 35 to 40%.  Unable to assess diastolic function because of A. fib.  No obvious regional wall motion abnormality.  Global hypokinesis.  Severe LA dilation.  Mild-moderate MR.  Mild AI without AS  . VAGINAL HYSTERECTOMY  1983     OB History   No obstetric history on file.     Family History  Problem Relation Age of Onset  . Dementia Father        lewy body dementia  . Heart disease Other        PACEMAKER DEFIB.      Social History   Tobacco Use  . Smoking status: Never Smoker  . Smokeless tobacco: Never Used  Vaping Use  . Vaping Use: Never used  Substance Use Topics  . Alcohol use: Yes    Alcohol/week: 0.0 standard drinks    Comment: once monthly  . Drug use: No    Home Medications Prior to Admission medications   Medication Sig Start Date End Date Taking? Authorizing Provider  calcium-vitamin D (OSCAL WITH D) 500-200 MG-UNIT per tablet Take 1 tablet by mouth 2 (two) times daily.    Yes [provider]  ELIQUIS 5 MG TABS tablet TAKE 1 TABLET BY MOUTH TWICE DAILY TO REPLACE WARFARIN Patient taking differently: Take 5 mg by mouth 2 (two) times daily. To replace warfarin 06/21/19  Yes Leonie Man, MD  furosemide (LASIX) 40 MG tablet Take 1 tablet on Mon Wed and  Fri ONLY Patient taking differently: Take 40 mg by mouth as needed for fluid.  03/11/18  Yes Kilroy, Luke K, PA-C  levETIRAcetam (KEPPRA) 500 MG tablet Take 1 tablet (500 mg total) by mouth 2 (two) times daily. 06/28/19  Yes Lomax, Amy, NP  lisinopril (PRINIVIL,ZESTRIL) 2.5 MG tablet Take 1 tablet (2.5 mg total) by mouth at bedtime. 03/11/18  Yes Kilroy, Luke K, PA-C  metoprolol succinate (TOPROL-XL) 100 MG 24 hr tablet Take 1 tablet (100 mg total) by mouth daily. Take with or immediately following a meal. 02/26/18  Yes Bhagat, Bhavinkumar, PA  Multiple Vitamin (MULTIVITAMIN WITH MINERALS) TABS tablet Take 1 tablet by mouth daily.   Yes [provider]  pravastatin (PRAVACHOL) 40 MG tablet Take 40 mg by mouth daily.  05/05/13  Yes [provider]    Allergies    Dilantin [phenytoin sodium extended] and Hydrocodone  Review of Systems   Review of Systems  Constitutional: Negative for chills and fever.  HENT: Negative  for congestion and rhinorrhea.   Respiratory: Positive for cough (now resolved) and shortness of breath. Negative for sputum production.   Cardiovascular: Positive for chest pain. Negative for palpitations.  Gastrointestinal: Positive for diarrhea. Negative for nausea and vomiting.  Genitourinary: Negative for difficulty urinating and dysuria.  Musculoskeletal: Negative for arthralgias and back pain.  Skin: Negative for rash and wound.  Neurological: Negative for light-headedness and headaches.    Physical Exam Updated Vital Signs BP (!) 143/73 (BP Location: Right Arm)   Pulse (!) 57   Temp 98.1 F (36.7 C) (Oral)   Resp 16   Ht 5\' 2"  (1.575 m)   Wt 73.1 kg   SpO2 96%   BMI 29.48 kg/m   Physical Exam Vitals and nursing note reviewed. Exam conducted with a chaperone present.  Constitutional:      General: She is not in acute distress.    Appearance: Normal appearance.  HENT:     Head: Normocephalic and atraumatic.     Nose: No rhinorrhea.      Mouth/Throat:     Mouth: Mucous membranes are dry.  Eyes:     General:        Right eye: No discharge.        Left eye: No discharge.     Conjunctiva/sclera: Conjunctivae normal.  Cardiovascular:     Rate and Rhythm: Tachycardia present. Rhythm irregular.  Pulmonary:     Effort: Pulmonary effort is normal. No respiratory distress.     Breath sounds: No stridor. No decreased breath sounds, wheezing or rhonchi.  Abdominal:     General: Abdomen is flat. There is no distension.     Palpations: Abdomen is soft.     Tenderness: There is no abdominal tenderness.  Musculoskeletal:        General: No tenderness or signs of injury.     Right lower leg: No edema.     Left lower leg: No edema.  Skin:    General: Skin is warm and dry.     Capillary Refill: Capillary refill takes less than 2 seconds.  Neurological:     General: No focal deficit present.     Mental Status: She is alert. Mental status is at baseline.     Motor: No weakness.  Psychiatric:        Mood and Affect: Mood normal.        Behavior: Behavior normal.     ED Results / Procedures / Treatments   Labs (all labs ordered are listed, but only abnormal results are displayed) Labs Reviewed  BASIC METABOLIC PANEL - Abnormal; Notable for the following components:      Result Value   CO2 21 (*)    Glucose, Bld 166 (*)    BUN 48 (*)    Creatinine, Ser 1.34 (*)    GFR, Estimated 40 (*)    All other components within normal limits  CBC - Abnormal; Notable for the following components:   WBC 12.1 (*)    Platelets 410 (*)    nRBC 0.4 (*)    All other components within normal limits  D-DIMER, QUANTITATIVE (NOT AT [facility]) - Abnormal; Notable for the following components:   D-Dimer, Quant 1.60 (*)    All other components within normal limits  RESPIRATORY PANEL BY RT PCR (FLU A&B, COVID)  MAGNESIUM  TSH  T4, FREE  T3, FREE  BASIC METABOLIC PANEL  TROPONIN I (HIGH SENSITIVITY)  TROPONIN I (HIGH SENSITIVITY)    EKG EKG  Interpretation  Date/Time:  Saturday December 10 2019 08:50:36 EST Ventricular Rate:  148 PR Interval:    QRS Duration: 114 QT Interval:  330 QTC Calculation: 518 R Axis:   -75 Text Interpretation: Atrial fibrillation with rapid ventricular response Left anterior fascicular block Minimal voltage criteria for LVH, may be normal variant ( Cornell product ) Septal infarct , age undetermined Abnormal ECG Confirmed by Dewaine Conger [phone]) on 12/10/2019 9:43:20 AM   Radiology CT Angio Chest PE W and/or Wo Contrast  Result Date: 12/10/2019 CLINICAL DATA:  Shortness of breath with elevated D-dimer. Fall 1 week ago with left-sided pain. EXAM: CT ANGIOGRAPHY CHEST CT ABDOMEN AND PELVIS WITH CONTRAST TECHNIQUE: Multidetector CT imaging of the chest was performed using the standard protocol during bolus administration of intravenous contrast. Multiplanar CT image reconstructions and MIPs were obtained to evaluate the vascular anatomy. Multidetector CT imaging of the abdomen and pelvis was performed using the standard protocol during bolus administration of intravenous contrast. CONTRAST:  82mL OMNIPAQUE IOHEXOL 350 MG/ML SOLN COMPARISON:  CT chest 11/18/2017 and CT abdomen 09/23/2013 FINDINGS: CTA CHEST FINDINGS Cardiovascular: Heart is normal size. Mild calcified plaque over the left anterior descending coronary artery. Minimal calcified plaque over the thoracic aorta. Pulmonary arterial system is adequately opacified and demonstrates no evidence of emboli. Mediastinum/Nodes: No evidence of mediastinal or hilar adenopathy. Remaining mediastinal structures are normal. Lungs/Pleura: Lungs are adequately inflated demonstrate a moderate size right pleural effusion and small left pleural effusion with minimal associated bibasilar dependent atelectasis. Airways are unremarkable. Musculoskeletal: No focal abnormality. Degenerative changes of the spine. Review of the MIP images confirms the above findings. CT ABDOMEN and  PELVIS FINDINGS Hepatobiliary: Known hemangioma slightly smaller over the right lobe of the liver. Previous cholecystectomy. Intrahepatic biliary tree is normal. Stable post cholecystectomy prominence of the common bile duct. Pancreas: Normal. Spleen: Normal. Adrenals/Urinary Tract: Adrenal glands are normal. Kidneys are normal size without hydronephrosis or nephrolithiasis. Subcentimeter hypodensity over the lower pole cortex of the left kidney too small to characterize but likely a cyst and unchanged. Ureters and bladder are normal. Stomach/Bowel: Stomach and small bowel are normal. Moderate diverticulosis of the colon specially over the sigmoid colon. Appendix not visualized. Vascular/Lymphatic: Mild calcified plaque over the abdominal aorta without evidence of aneurysm. No adenopathy. Reproductive: Previous hysterectomy. Other: Mild-to-moderate amount of free fluid in the pelvis and inferior pericolic gutters. Musculoskeletal: No focal abnormality. Degenerative change of the spine with mild multilevel disc disease over the lumbar spine. Review of the MIP images confirms the above findings. IMPRESSION: 1. No evidence of pulmonary embolism. 2. Moderate size right pleural effusion and small left pleural effusion with associated bibasilar atelectasis. 3. No acute findings in the abdomen/pelvis. Mild-to-moderate amount of free fluid in the pelvis and inferior pericolic gutters. 4. Colonic diverticulosis. 5. Subcentimeter left renal cortical hypodensity too small to characterize, but likely a cyst and unchanged. 6. Aortic atherosclerosis. Minimal atherosclerotic coronary artery disease. 7. Known right liver hemangioma slightly smaller. Aortic Atherosclerosis (ICD10-I70.0). Electronically Signed   By: Marin Olp M.D.   On: 12/10/2019 13:12   CT ABDOMEN PELVIS W CONTRAST  Result Date: 12/10/2019 CLINICAL DATA:  Shortness of breath with elevated D-dimer. Fall 1 week ago with left-sided pain. EXAM: CT ANGIOGRAPHY  CHEST CT ABDOMEN AND PELVIS WITH CONTRAST TECHNIQUE: Multidetector CT imaging of the chest was performed using the standard protocol during bolus administration of intravenous contrast. Multiplanar CT image reconstructions and MIPs were obtained to evaluate the vascular anatomy. Multidetector CT imaging  of the abdomen and pelvis was performed using the standard protocol during bolus administration of intravenous contrast. CONTRAST:  35mL OMNIPAQUE IOHEXOL 350 MG/ML SOLN COMPARISON:  CT chest 11/18/2017 and CT abdomen 09/23/2013 FINDINGS: CTA CHEST FINDINGS Cardiovascular: Heart is normal size. Mild calcified plaque over the left anterior descending coronary artery. Minimal calcified plaque over the thoracic aorta. Pulmonary arterial system is adequately opacified and demonstrates no evidence of emboli. Mediastinum/Nodes: No evidence of mediastinal or hilar adenopathy. Remaining mediastinal structures are normal. Lungs/Pleura: Lungs are adequately inflated demonstrate a moderate size right pleural effusion and small left pleural effusion with minimal associated bibasilar dependent atelectasis. Airways are unremarkable. Musculoskeletal: No focal abnormality. Degenerative changes of the spine. Review of the MIP images confirms the above findings. CT ABDOMEN and PELVIS FINDINGS Hepatobiliary: Known hemangioma slightly smaller over the right lobe of the liver. Previous cholecystectomy. Intrahepatic biliary tree is normal. Stable post cholecystectomy prominence of the common bile duct. Pancreas: Normal. Spleen: Normal. Adrenals/Urinary Tract: Adrenal glands are normal. Kidneys are normal size without hydronephrosis or nephrolithiasis. Subcentimeter hypodensity over the lower pole cortex of the left kidney too small to characterize but likely a cyst and unchanged. Ureters and bladder are normal. Stomach/Bowel: Stomach and small bowel are normal. Moderate diverticulosis of the colon specially over the sigmoid colon.  Appendix not visualized. Vascular/Lymphatic: Mild calcified plaque over the abdominal aorta without evidence of aneurysm. No adenopathy. Reproductive: Previous hysterectomy. Other: Mild-to-moderate amount of free fluid in the pelvis and inferior pericolic gutters. Musculoskeletal: No focal abnormality. Degenerative change of the spine with mild multilevel disc disease over the lumbar spine. Review of the MIP images confirms the above findings. IMPRESSION: 1. No evidence of pulmonary embolism. 2. Moderate size right pleural effusion and small left pleural effusion with associated bibasilar atelectasis. 3. No acute findings in the abdomen/pelvis. Mild-to-moderate amount of free fluid in the pelvis and inferior pericolic gutters. 4. Colonic diverticulosis. 5. Subcentimeter left renal cortical hypodensity too small to characterize, but likely a cyst and unchanged. 6. Aortic atherosclerosis. Minimal atherosclerotic coronary artery disease. 7. Known right liver hemangioma slightly smaller. Aortic Atherosclerosis (ICD10-I70.0). Electronically Signed   By: Marin Olp M.D.   On: 12/10/2019 13:12    Procedures .Critical Care Performed by: Breck Coons, MD Authorized by: Breck Coons, MD   Critical care provider statement:    Critical care time (minutes):  45   Critical care was necessary to treat or prevent imminent or life-threatening deterioration of the following conditions:  Circulatory failure and cardiac failure   Critical care was time spent personally by me on the following activities:  Discussions with consultants, evaluation of patient's response to treatment, examination of patient, ordering and performing treatments and interventions, ordering and review of laboratory studies, ordering and review of radiographic studies, pulse oximetry, re-evaluation of patient's condition, obtaining history from patient or surrogate, review of old charts, blood draw for specimens and development of treatment plan  with patient or surrogate   (including critical care time)  Medications Ordered in ED Medications  metoprolol tartrate (LOPRESSOR) injection 5 mg (5 mg Intravenous Given 12/10/19 1230)  amiodarone (NEXTERONE PREMIX) 360-4.14 MG/200ML-% (1.8 mg/mL) IV infusion (0 mg/hr Intravenous Stopped 12/10/19 2136)    Followed by  amiodarone (NEXTERONE PREMIX) 360-4.14 MG/200ML-% (1.8 mg/mL) IV infusion (30 mg/hr Intravenous New Bag/Given 12/11/19 0040)  metoprolol tartrate (LOPRESSOR) tablet 50 mg (50 mg Oral Given 12/10/19 2126)  pravastatin (PRAVACHOL) tablet 40 mg (has no administration in time range)  apixaban (ELIQUIS) tablet  5 mg (5 mg Oral Given 12/10/19 2127)  levETIRAcetam (KEPPRA) tablet 500 mg (500 mg Oral Given 12/10/19 2127)  calcium-vitamin D (OSCAL WITH D) 500-200 MG-UNIT per tablet 1 tablet (1 tablet Oral Given 12/10/19 2126)  multivitamin with minerals tablet 1 tablet (has no administration in time range)  sodium chloride flush (NS) 0.9 % injection 3 mL (3 mLs Intravenous Given 12/10/19 2128)  sodium chloride flush (NS) 0.9 % injection 3 mL (has no administration in time range)  0.9 %  sodium chloride infusion (has no administration in time range)  acetaminophen (TYLENOL) tablet 650 mg (has no administration in time range)  ondansetron (ZOFRAN) injection 4 mg (has no administration in time range)  furosemide (LASIX) injection 40 mg (40 mg Intravenous Given 12/10/19 1835)  ipratropium (ATROVENT) nebulizer solution 0.5 mg (has no administration in time range)  lactated ringers bolus 500 mL (0 mLs Intravenous Stopped 12/10/19 1051)  lactated ringers bolus 500 mL (0 mLs Intravenous Stopped 12/10/19 1226)  iohexol (OMNIPAQUE) 350 MG/ML injection 80 mL (80 mLs Intravenous Contrast Given 12/10/19 1144)  LORazepam (ATIVAN) injection 0.5 mg (0.5 mg Intravenous Given 12/10/19 1209)  sodium chloride 0.9 % bolus 1,000 mL (0 mLs Intravenous Stopped 12/10/19 1425)  amiodarone (NEXTERONE) 1.8 mg/mL  load via infusion 150 mg (150 mg Intravenous Bolus from Bag 12/10/19 1423)    ED Course  I have reviewed the triage vital signs and the nursing notes.  Pertinent labs & imaging results that were available during my care of the patient were reviewed by me and considered in my medical decision making (see chart for details).    MDM Rules/Calculators/A&P                          Waxing and waning chest tightness shortness of breath at home, worsening symptoms, history of atrial fibrillation controlled on beta-blockers and anticoagulated.  Comes today with worsening symptoms.  Is also had some diarrhea poor p.o. intake.  Screening labs at triage show a mild leukocytosis, elevation in creatinine from baseline 0.8 0.9 to approximately 1.34.  Other lab studies that reviewed by myself are unremarkable this time.  Patient will be screened for PE with a D-dimer we will get troponins will get IV Lopressor IV fluids as there may be some dehydration at [facility] atrial fibrillation.  No infectious signs or symptoms at this time.  She did have cough congestion a few days to a week ago but is resolved.  She will get a Covid test.  Initial boluses of Lopressor did not lower her rate significantly but did drop her blood pressure.  Total fluid bolus of 1 L was given.  After that for rate control amiodarone was started.  Viral testing was unremarkable but D-dimer was elevated so she did get a CT PE study that showed no venous thromboembolism but did show bilateral pleural effusions.  Concerns at this point for atrial fibrillation with rapid ventricular response not controlled, will likely need admission, also possible acute kidney injury.  Hospital team agrees to admit this patient for further management.  CRITICAL CARE Performed by: Breck Coons   Total critical care time: 45 minutes  Critical care time was exclusive of separately billable procedures and treating other patients.  Critical care was necessary to  treat or prevent imminent or life-threatening deterioration.  Critical care was time spent personally by me on the following activities: development of treatment plan with patient and/or surrogate as well as  nursing, discussions with consultants, evaluation of patient's response to treatment, examination of patient, obtaining history from patient or surrogate, ordering and performing treatments and interventions, ordering and review of laboratory studies, ordering and review of radiographic studies, pulse oximetry and re-evaluation of patient's condition.    Final Clinical Impression(s) / ED Diagnoses Final diagnoses:  Atrial fibrillation with RVR (HCC)  SOB (shortness of breath)  Pleural effusion  AKI (acute kidney injury) [facility])    Rx / DC Orders ED Discharge Orders    None       Breck Coons, MD 12/11/19 [phone]

## 2019-12-10 NOTE — ED Triage Notes (Signed)
Patient states she  [place] [address] 1 week ago was having pain in her left side started getting lightheaded at times ans has become sob , states her sob is getting worse, hard to lay down at night to sleep, states she the sob got back enough today that she had to use her husbands o2

## 2019-12-10 NOTE — H&P (Signed)
History and Physical    Maureen [name] WGN:[id number] DOB: [birth year] DOA: 12/10/2019  PCP: Mayra Neer, MD (Confirm with patient/family/NH records and if not entered, this has to be entered at [facility] point of entry) Patient coming from: Home  I have personally briefly reviewed patient's old medical records in [place]  Chief Complaint: Palpitations  HPI: Maureen [name] is a 79 y.o. female with medical history significant of status post cardioversion, nonischemic cardiomyopathy with LVEF 35 to 40% February 2020 on Lasix, hypertension, seizure disorder presented with worsening of shortness of breath and palpitations.  Patient reported her A. fib has not been well controlled recently, as for 2-3 weeks she has experienced frequent palpitations.  She did not seek any cardiology consultation (most recent cardiology visit was last year ) or increased her beta-blocker.  About 1 week ago, she started to feel increasing shortness of breath and wheezing.  This morning she woke up with new onset of chest pain she said it is pressure-like, constant and radiated to the left shoulder.  In time her wheezing and shortness of breath became worse.  She denied any cough no fever chills no edema. ED Course: Patient was found in rapid A. fib, received Lopressor IV push x2, blood pressure dropped.  Then patient was given 1 L of LR and started on amiodarone drip.  CT angiogram negative for PE but bilateral pleural effusion R>L.  Review of Systems: As per HPI otherwise 14 point review of systems negative.    Past Medical History:  Diagnosis Date  . Atrial fibrillation ([place])   . Benign tumor of meninges ([place])   . Chronic combined systolic and diastolic CHF, NYHA class 2 and ACA/AHA stage C 01/2018   Exacerbated by A. fib; EF 35 to 40% with global HK.  Reduced cardiac output and index (3.2/1.9).   -Associated moderate pulmonary hypertension-(mean PA P 38 mmHg)  . GERD (gastroesophageal reflux disease)     occ. in past  . Headache(784.0)   . HTN (hypertension), benign    pt. states no hypertension  . NICM (nonischemic cardiomyopathy) ([place]) 01/2018   Echo with EF of 35 to 40%.  Global HK.  Mild to moderate MR.  Mild nonobstructive CAD by cath.  Cardiac output/index 3.2/1.9  . Pneumonia    ? as child  . PONV (postoperative nausea and vomiting)   . Seizures ([place])    positive for HBP    Past Surgical History:  Procedure Laterality Date  . CARDIOVERSION N/A 02/24/2018   Procedure: CARDIOVERSION;  Surgeon: Sueanne Margarita, MD;  Location: [facility] ENDOSCOPY;  Service: Cardiovascular;  Laterality: N/A;  . CATARACT EXTRACTION, BILATERAL Bilateral   . CHOLECYSTECTOMY    . COLONOSCOPY WITH PROPOFOL N/A 11/30/2012   Procedure: COLONOSCOPY WITH PROPOFOL;  Surgeon: Garlan Fair, MD;  Location: [facility] ENDOSCOPY;  Service: Endoscopy;  Laterality: N/A;  . ESOPHAGOGASTRODUODENOSCOPY (EGD) WITH PROPOFOL N/A 11/30/2012   Procedure: ESOPHAGOGASTRODUODENOSCOPY (EGD) WITH PROPOFOL;  Surgeon: Garlan Fair, MD;  Location: [facility] ENDOSCOPY;  Service: Endoscopy;  Laterality: N/A;  . ESOPHAGOGASTRODUODENOSCOPY (EGD) WITH PROPOFOL N/A 03/14/2015   Procedure: ESOPHAGOGASTRODUODENOSCOPY (EGD) WITH PROPOFOL;  Surgeon: Arta Silence, MD;  Location: [facility] ENDOSCOPY;  Service: Endoscopy;  Laterality: N/A;  . EUS N/A 01/05/2013   Procedure: FULL UPPER ENDOSCOPIC ULTRASOUND (EUS) RADIAL;  Surgeon: Arta Silence, MD;  Location: [facility] ENDOSCOPY;  Service: Endoscopy;  Laterality: N/A;  EUS with possible FNA  . EUS N/A 03/14/2015   Procedure: UPPER ENDOSCOPIC ULTRASOUND (EUS) RADIAL;  Surgeon: Arta Silence, MD;  Location: [facility] ENDOS[facility];  Service: Endoscopy;  Laterality: N/A;  . EYE SURGERY Left    "few weeks ago scar tissue laser surgery"  . FINE NEEDLE ASPIRATION N/A 01/05/2013   Procedure: FINE NEEDLE ASPIRATION (FNA) RADIAL;  Surgeon: Arta Silence, MD;  Location: [facility] ENDOSCOPY;  Service: Endoscopy;  Laterality: N/A;  . KNEE  ARTHROSCOPY Left   . [place]  12/1990  . RIGHT/LEFT HEART CATH AND CORONARY ANGIOGRAPHY N/A 02/22/2018   Procedure: RIGHT/LEFT HEART CATH AND CORONARY ANGIOGRAPHY;  Surgeon: Larey Dresser, MD;  Location: [facility] INVASIVE CV LAB;;  Nonobstructive CAD.  Mild to moderate pulmonary hypertension (PA P 47/24 mmHg-mean 38 mmHg.  PCWP 22 mmHg with LVEDP 24 mmHg. CO/CI = 3.2/1.9  . TEE WITHOUT CARDIOVERSION N/A 02/24/2018   Procedure: TRANSESOPHAGEAL ECHOCARDIOGRAM (TEE) - WITH DCCV;  Surgeon: Sueanne Margarita, MD;  Location: [facility] ENDOSCOPY;; EF 35-40%.  Global HK.  No R WMA. Mild AI, Mild-Mod MR. Severe LA dilation (no LAA thrombus).  Severely reduced RV function.  Mild RA dilation --successful DCCV  . TRANSTHORACIC ECHOCARDIOGRAM  02/18/2018   EF 35 to 40%.  Unable to assess diastolic function because of A. fib.  No obvious regional wall motion abnormality.  Global hypokinesis.  Severe LA dilation.  Mild-moderate MR.  Mild AI without AS  . VAGINAL HYSTERECTOMY  1983     reports that she has never smoked. She has never used smokeless tobacco. She reports current alcohol use. She reports that she does not use drugs.  Allergies  Allergen Reactions  . Dilantin [Phenytoin Sodium Extended]      Stupor, ataxia.   Marland Kitchen Hydrocodone Nausea And Vomiting    Family History  Problem Relation Age of Onset  . Dementia Father        lewy body dementia  . Heart disease Other        PACEMAKER DEFIB.      Prior to Admission medications   Medication Sig Start Date End Date Taking? Authorizing Provider  calcium-vitamin D (OSCAL WITH D) 500-200 MG-UNIT per tablet Take 1 tablet by mouth 2 (two) times daily.    Yes [provider]  ELIQUIS 5 MG TABS tablet TAKE 1 TABLET BY MOUTH TWICE DAILY TO REPLACE WARFARIN Patient taking differently: Take 5 mg by mouth 2 (two) times daily. To replace warfarin 06/21/19  Yes Leonie Man, MD  furosemide (LASIX) 40 MG tablet Take 1 tablet on Mon Wed and Fri  ONLY Patient taking differently: Take 40 mg by mouth as needed for fluid.  03/11/18  Yes Kilroy, Luke K, PA-C  levETIRAcetam (KEPPRA) 500 MG tablet Take 1 tablet (500 mg total) by mouth 2 (two) times daily. 06/28/19  Yes Lomax, Amy, NP  lisinopril (PRINIVIL,ZESTRIL) 2.5 MG tablet Take 1 tablet (2.5 mg total) by mouth at bedtime. 03/11/18  Yes Kilroy, Luke K, PA-C  metoprolol succinate (TOPROL-XL) 100 MG 24 hr tablet Take 1 tablet (100 mg total) by mouth daily. Take with or immediately following a meal. 02/26/18  Yes Bhagat, Bhavinkumar, PA  Multiple Vitamin (MULTIVITAMIN WITH MINERALS) TABS tablet Take 1 tablet by mouth daily.   Yes [provider]  pravastatin (PRAVACHOL) 40 MG tablet Take 40 mg by mouth daily.  05/05/13  Yes [provider]    Physical Exam: Vitals:   12/10/19 1300 12/10/19 1340 12/10/19 1415 12/10/19 1430  BP: (!) 82/69 100/84 (!) 115/98 106/71  Pulse: 95 100 (!) 150 (!) 108  Resp: 18  20 20 18   Temp:      TempSrc:      SpO2: 99% 98% 98% 98%  Weight:      Height:        Constitutional: NAD, calm, comfortable Vitals:   12/10/19 1300 12/10/19 1340 12/10/19 1415 12/10/19 1430  BP: (!) 82/69 100/84 (!) 115/98 106/71  Pulse: 95 100 (!) 150 (!) 108  Resp: 18 20 20 18   Temp:      TempSrc:      SpO2: 99% 98% 98% 98%  Weight:      Height:       Eyes: PERRL, lids and conjunctivae normal ENMT: Mucous membranes are moist. Posterior pharynx clear of any exudate or lesions.Normal dentition.  Neck: normal, supple, no masses, no thyromegaly Respiratory: clear to auscultation bilaterally, diffused wheezing bilaterally no crackles.  Increased respiratory effort, talking in broken sentences. No accessory muscle use.  Cardiovascular: Tachycardia and irregular rhythm, no murmurs / rubs / gallops. No extremity edema. 2+ pedal pulses. No carotid bruits.  Abdomen: no tenderness, no masses palpated. No hepatosplenomegaly. Bowel sounds positive.  Musculoskeletal: no  clubbing / cyanosis. No joint deformity upper and lower extremities. Good ROM, no contractures. Normal muscle tone.  Skin: no rashes, lesions, ulcers. No induration Neurologic: CN 2-12 grossly intact. Sensation intact, DTR normal. Strength 5/5 in all 4.  Psychiatric: Normal judgment and insight. Alert and oriented x 3. Normal mood.     Labs on Admission: I have personally reviewed following labs and imaging studies  CBC: Recent Labs  Lab 12/10/19 0921  WBC 12.1*  HGB 13.2  HCT 42.4  MCV 90.0  PLT 371*   Basic Metabolic Panel: Recent Labs  Lab 12/10/19 0921  NA 136  K 4.9  CL 103  CO2 21*  GLUCOSE 166*  BUN 48*  CREATININE 1.34*  CALCIUM 9.4  MG 2.1   GFR: Estimated Creatinine Clearance: 30.7 mL/min (A) (by C-G formula based on SCr of 1.34 mg/dL (H)). Liver Function Tests: No results for input(s): AST, ALT, ALKPHOS, BILITOT, PROT, ALBUMIN in the last 168 hours. No results for input(s): LIPASE, AMYLASE in the last 168 hours. No results for input(s): AMMONIA in the last 168 hours. Coagulation Profile: No results for input(s): INR, PROTIME in the last 168 hours. Cardiac Enzymes: No results for input(s): CKTOTAL, CKMB, CKMBINDEX, TROPONINI in the last 168 hours. BNP (last 3 results) No results for input(s): PROBNP in the last 8760 hours. HbA1C: No results for input(s): HGBA1C in the last 72 hours. CBG: No results for input(s): GLUCAP in the last 168 hours. Lipid Profile: No results for input(s): CHOL, HDL, LDLCALC, TRIG, CHOLHDL, LDLDIRECT in the last 72 hours. Thyroid Function Tests: No results for input(s): TSH, T4TOTAL, FREET4, T3FREE, THYROIDAB in the last 72 hours. Anemia Panel: No results for input(s): VITAMINB12, FOLATE, FERRITIN, TIBC, IRON, RETICCTPCT in the last 72 hours. Urine analysis: No results found for: COLORURINE, APPEARANCEUR, LABSPEC, [place], GLUCOSEU, HGBUR, BILIRUBINUR, KETONESUR, PROTEINUR, UROBILINOGEN, NITRITE, LEUKOCYTESUR  Radiological  Exams on Admission: CT Angio Chest PE W and/or Wo Contrast  Result Date: 12/10/2019 CLINICAL DATA:  Shortness of breath with elevated D-dimer. Fall 1 week ago with left-sided pain. EXAM: CT ANGIOGRAPHY CHEST CT ABDOMEN AND PELVIS WITH CONTRAST TECHNIQUE: Multidetector CT imaging of the chest was performed using the standard protocol during bolus administration of intravenous contrast. Multiplanar CT image reconstructions and MIPs were obtained to evaluate the vascular anatomy. Multidetector CT imaging of the abdomen and pelvis was performed using the  standard protocol during bolus administration of intravenous contrast. CONTRAST:  50mL OMNIPAQUE IOHEXOL 350 MG/ML SOLN COMPARISON:  CT chest 11/18/2017 and CT abdomen 09/23/2013 FINDINGS: CTA CHEST FINDINGS Cardiovascular: Heart is normal size. Mild calcified plaque over the left anterior descending coronary artery. Minimal calcified plaque over the thoracic aorta. Pulmonary arterial system is adequately opacified and demonstrates no evidence of emboli. Mediastinum/Nodes: No evidence of mediastinal or hilar adenopathy. Remaining mediastinal structures are normal. Lungs/Pleura: Lungs are adequately inflated demonstrate a moderate size right pleural effusion and small left pleural effusion with minimal associated bibasilar dependent atelectasis. Airways are unremarkable. Musculoskeletal: No focal abnormality. Degenerative changes of the spine. Review of the MIP images confirms the above findings. CT ABDOMEN and PELVIS FINDINGS Hepatobiliary: Known hemangioma slightly smaller over the right lobe of the liver. Previous cholecystectomy. Intrahepatic biliary tree is normal. Stable post cholecystectomy prominence of the common bile duct. Pancreas: Normal. Spleen: Normal. Adrenals/Urinary Tract: Adrenal glands are normal. Kidneys are normal size without hydronephrosis or nephrolithiasis. Subcentimeter hypodensity over the lower pole cortex of the left kidney too small to  characterize but likely a cyst and unchanged. Ureters and bladder are normal. Stomach/Bowel: Stomach and small bowel are normal. Moderate diverticulosis of the colon specially over the sigmoid colon. Appendix not visualized. Vascular/Lymphatic: Mild calcified plaque over the abdominal aorta without evidence of aneurysm. No adenopathy. Reproductive: Previous hysterectomy. Other: Mild-to-moderate amount of free fluid in the pelvis and inferior pericolic gutters. Musculoskeletal: No focal abnormality. Degenerative change of the spine with mild multilevel disc disease over the lumbar spine. Review of the MIP images confirms the above findings. IMPRESSION: 1. No evidence of pulmonary embolism. 2. Moderate size right pleural effusion and small left pleural effusion with associated bibasilar atelectasis. 3. No acute findings in the abdomen/pelvis. Mild-to-moderate amount of free fluid in the pelvis and inferior pericolic gutters. 4. Colonic diverticulosis. 5. Subcentimeter left renal cortical hypodensity too small to characterize, but likely a cyst and unchanged. 6. Aortic atherosclerosis. Minimal atherosclerotic coronary artery disease. 7. Known right liver hemangioma slightly smaller. Aortic Atherosclerosis (ICD10-I70.0). Electronically Signed   By: Marin Olp M.D.   On: 12/10/2019 13:12   CT ABDOMEN PELVIS W CONTRAST  Result Date: 12/10/2019 CLINICAL DATA:  Shortness of breath with elevated D-dimer. Fall 1 week ago with left-sided pain. EXAM: CT ANGIOGRAPHY CHEST CT ABDOMEN AND PELVIS WITH CONTRAST TECHNIQUE: Multidetector CT imaging of the chest was performed using the standard protocol during bolus administration of intravenous contrast. Multiplanar CT image reconstructions and MIPs were obtained to evaluate the vascular anatomy. Multidetector CT imaging of the abdomen and pelvis was performed using the standard protocol during bolus administration of intravenous contrast. CONTRAST:  47mL OMNIPAQUE IOHEXOL 350  MG/ML SOLN COMPARISON:  CT chest 11/18/2017 and CT abdomen 09/23/2013 FINDINGS: CTA CHEST FINDINGS Cardiovascular: Heart is normal size. Mild calcified plaque over the left anterior descending coronary artery. Minimal calcified plaque over the thoracic aorta. Pulmonary arterial system is adequately opacified and demonstrates no evidence of emboli. Mediastinum/Nodes: No evidence of mediastinal or hilar adenopathy. Remaining mediastinal structures are normal. Lungs/Pleura: Lungs are adequately inflated demonstrate a moderate size right pleural effusion and small left pleural effusion with minimal associated bibasilar dependent atelectasis. Airways are unremarkable. Musculoskeletal: No focal abnormality. Degenerative changes of the spine. Review of the MIP images confirms the above findings. CT ABDOMEN and PELVIS FINDINGS Hepatobiliary: Known hemangioma slightly smaller over the right lobe of the liver. Previous cholecystectomy. Intrahepatic biliary tree is normal. Stable post cholecystectomy prominence of the common  bile duct. Pancreas: Normal. Spleen: Normal. Adrenals/Urinary Tract: Adrenal glands are normal. Kidneys are normal size without hydronephrosis or nephrolithiasis. Subcentimeter hypodensity over the lower pole cortex of the left kidney too small to characterize but likely a cyst and unchanged. Ureters and bladder are normal. Stomach/Bowel: Stomach and small bowel are normal. Moderate diverticulosis of the colon specially over the sigmoid colon. Appendix not visualized. Vascular/Lymphatic: Mild calcified plaque over the abdominal aorta without evidence of aneurysm. No adenopathy. Reproductive: Previous hysterectomy. Other: Mild-to-moderate amount of free fluid in the pelvis and inferior pericolic gutters. Musculoskeletal: No focal abnormality. Degenerative change of the spine with mild multilevel disc disease over the lumbar spine. Review of the MIP images confirms the above findings. IMPRESSION: 1. No  evidence of pulmonary embolism. 2. Moderate size right pleural effusion and small left pleural effusion with associated bibasilar atelectasis. 3. No acute findings in the abdomen/pelvis. Mild-to-moderate amount of free fluid in the pelvis and inferior pericolic gutters. 4. Colonic diverticulosis. 5. Subcentimeter left renal cortical hypodensity too small to characterize, but likely a cyst and unchanged. 6. Aortic atherosclerosis. Minimal atherosclerotic coronary artery disease. 7. Known right liver hemangioma slightly smaller. Aortic Atherosclerosis (ICD10-I70.0). Electronically Signed   By: Marin Olp M.D.   On: 12/10/2019 13:12    EKG: Independently reviewed.  Rapid A. Fib, no acute ST-T changes  Assessment/Plan Active Problems:   A-fib (HCC)   CHF (congestive heart failure) ([place])  (please populate well all problems here in Problem List. (For example, if patient is on BP meds at home and you resume or decide to hold them, it is a problem that needs to be her. Same for CAD, COPD, HLD and so on)  A. fib with RVR -Has history of refractory A. fib status post cardioversion x1 last year.  Appears to become poorly controlled again recently, which likely was made worse with the use of albuterol. -It appears that the patient was on amiodarone last year which was discontinued after cardioversion.  Not sure whether patient failed amiodarone before.  As patient was presented to IV beta-blocker pushes in the ED, amiodarone drip was started in the ED. consult cardiology, discussed with on-call cardiology did not admission regarding rate control medications, chronic stable see patient and decide. -Continue Eliquis  Acute on chronic systolic CHF decompensation -Postponed echo to tomorrow once heart rate more controlled -New onset wheezing appears to be related to heart failure resident lung etiology, as she does not have history of COPD or asthma. -Atropine as needed -Cardiology help [place] to decide when to  start diuresis -Chronic systolic failure likely secondary to nonischemic cardiomyopathy as patient had a normal left-sided cardiac cath last year.  Seizure disorder -Keppra  DVT prophylaxis: Eliquis Code Status: Full code Family Communication: None at bedside Disposition Plan: Expect 1 to 2 days hospital stay for amiodarone loading versus other rate control medications versus cardioversion as patient has history of refractory A. fib.  PT evaluation start from tomorrow Consults called: Cardiology Admission status: PCU   Lequita Halt MD [facility] Pager [phone] 12/10/2019, 2:47 PM

## 2019-12-10 NOTE — ED Notes (Signed)
Provider aware of patient's BP. Will medicate per MAR.

## 2019-12-11 ENCOUNTER — Inpatient Hospital Stay (HOSPITAL_COMMUNITY): Payer: Medicare PPO

## 2019-12-11 DIAGNOSIS — I5023 Acute on chronic systolic (congestive) heart failure: Secondary | ICD-10-CM | POA: Diagnosis not present

## 2019-12-11 DIAGNOSIS — I4819 Other persistent atrial fibrillation: Secondary | ICD-10-CM

## 2019-12-11 LAB — BASIC METABOLIC PANEL
Anion gap: 10 (ref 5–15)
BUN: 48 mg/dL — ABNORMAL HIGH (ref 8–23)
CO2: 21 mmol/L — ABNORMAL LOW (ref 22–32)
Calcium: 9.1 mg/dL (ref 8.9–10.3)
Chloride: 105 mmol/L (ref 98–111)
Creatinine, Ser: 1.33 mg/dL — ABNORMAL HIGH (ref 0.44–1.00)
GFR, Estimated: 40 mL/min — ABNORMAL LOW (ref >60)
Glucose, Bld: 133 mg/dL — ABNORMAL HIGH (ref 70–99)
Potassium: 4.2 mmol/L (ref 3.5–5.1)
Sodium: 136 mmol/L (ref 135–145)

## 2019-12-11 LAB — ECHOCARDIOGRAM COMPLETE
Area-P 1/2: 3.39 cm2
Calc EF: 45.3 %
Height: 62 in
MV M vel: 4.81 m/s
MV Peak grad: 92.5 mmHg
P 1/2 time: 543 msec
Radius: 0.45 cm
S' Lateral: 3.6 cm
Single Plane A2C EF: 43.4 %
Single Plane A4C EF: 41.8 %
Weight: 2579.2 oz

## 2019-12-11 LAB — MAGNESIUM: Magnesium: 2 mg/dL (ref 1.7–2.4)

## 2019-12-11 MED ORDER — METOPROLOL TARTRATE 25 MG PO TABS
25.0000 mg | ORAL_TABLET | Freq: Two times a day (BID) | ORAL | Status: DC
  Administered 2019-12-11 – 2019-12-12 (×2): 25 mg via ORAL
  Filled 2019-12-11 (×2): qty 1

## 2019-12-11 MED ORDER — IPRATROPIUM BROMIDE 0.02 % IN SOLN: 0.5000 mg | Freq: Four times a day (QID) | RESPIRATORY_TRACT | Status: DC | PRN

## 2019-12-11 MED ORDER — FUROSEMIDE 10 MG/ML IJ SOLN
40.0000 mg | Freq: Two times a day (BID) | INTRAMUSCULAR | Status: DC
  Administered 2019-12-11 – 2019-12-12 (×2): 40 mg via INTRAVENOUS
  Filled 2019-12-11 (×2): qty 4

## 2019-12-11 MED ORDER — AMIODARONE HCL 200 MG PO TABS
200.0000 mg | ORAL_TABLET | Freq: Two times a day (BID) | ORAL | Status: DC
  Administered 2019-12-11 – 2019-12-12 (×3): 200 mg via ORAL
  Filled 2019-12-11 (×3): qty 1

## 2019-12-11 NOTE — Progress Notes (Signed)
  Echocardiogram 2D Echocardiogram has been performed.  Maureen [name] 12/11/2019, 12:00 PM

## 2019-12-11 NOTE — Progress Notes (Signed)
Progress Note  Patient Name: Maureen [name] Date of Encounter: 12/11/2019  [facility] Cardiologist: Glenetta Hew, MD   Subjective   Reports dyspnea improving but remains short of breath.  Converted to sinus rhythm on IV amiodarone, currently normal sinus rhythm with rate 50s.  Stable renal function (creatinine 1.3 > 1.3).  I/os not recorded.  Weight down 2 pounds from yesterday.  Inpatient Medications    Scheduled Meds: . apixaban  5 mg Oral BID  . calcium-vitamin D  1 tablet Oral BID  . furosemide  40 mg Intravenous Daily  . levETIRAcetam  500 mg Oral BID  . metoprolol tartrate  50 mg Oral BID  . multivitamin with minerals  1 tablet Oral Daily  . pravastatin  40 mg Oral Daily  . sodium chloride flush  3 mL Intravenous Q12H   Continuous Infusions: . sodium chloride    . amiodarone 30 mg/hr (12/11/19 0040)   PRN Meds: sodium chloride, acetaminophen, ipratropium, metoprolol tartrate, ondansetron (ZOFRAN) IV, sodium chloride flush   Vital Signs    Vitals:   12/11/19 0036 12/11/19 0500 12/11/19 0516 12/11/19 0805  BP: 130/78  (!) 143/73 134/76  Pulse: (!) 59  (!) 57 61  Resp: 18  16 18   Temp: 97.6 F (36.4 C)  98.1 F (36.7 C) 97.7 F (36.5 C)  TempSrc: Oral  Oral Oral  SpO2: 94%  96% 96%  Weight:  73.1 kg    Height:        Intake/Output Summary (Last 24 hours) at 12/11/2019 1058 Last data filed at 12/11/2019 0040 Gross per 24 hour  Intake 1837.73 ml  Output --  Net 1837.73 ml   Last 3 Weights 12/11/2019 12/10/2019 12/10/2019  Weight (lbs) 161 lb 3.2 oz 163 lb 1.6 oz 160 lb  Weight (kg) 73.12 kg 73.982 kg 72.576 kg      Telemetry    Normal sinus rhythm, rate 50s to 60s- Personally Reviewed  ECG    No new ECG- Personally Reviewed  Physical Exam   GEN: No acute distress.   Neck: + JVD Cardiac: RRR, no murmurs Respiratory: Clear to auscultation bilaterally. GI: Soft, nontender, non-distended  MS: No edema; No deformity. Neuro:  Nonfocal   Psych: Normal affect   Labs    High Sensitivity Troponin:   Recent Labs  Lab 12/10/19 0921 12/10/19 1308  TROPONINIHS 15 14      Chemistry Recent Labs  Lab 12/10/19 0921 12/11/19 0818  NA 136 136  K 4.9 4.2  CL 103 105  CO2 21* 21*  GLUCOSE 166* 133*  BUN 48* 48*  CREATININE 1.34* 1.33*  CALCIUM 9.4 9.1  GFRNONAA 40* 40*  ANIONGAP 12 10     Hematology Recent Labs  Lab 12/10/19 0921  WBC 12.1*  RBC 4.71  HGB 13.2  HCT 42.4  MCV 90.0  MCH 28.0  MCHC 31.1  RDW 14.4  PLT 410*    BNPNo results for input(s): BNP, PROBNP in the last 168 hours.   DDimer  Recent Labs  Lab 12/10/19 0921  DDIMER 1.60*     Radiology    CT Angio Chest PE W and/or Wo Contrast  Result Date: 12/10/2019 CLINICAL DATA:  Shortness of breath with elevated D-dimer. Fall 1 week ago with left-sided pain. EXAM: CT ANGIOGRAPHY CHEST CT ABDOMEN AND PELVIS WITH CONTRAST TECHNIQUE: Multidetector CT imaging of the chest was performed using the standard protocol during bolus administration of intravenous contrast. Multiplanar CT image reconstructions and MIPs were  obtained to evaluate the vascular anatomy. Multidetector CT imaging of the abdomen and pelvis was performed using the standard protocol during bolus administration of intravenous contrast. CONTRAST:  70mL OMNIPAQUE IOHEXOL 350 MG/ML SOLN COMPARISON:  CT chest 11/18/2017 and CT abdomen 09/23/2013 FINDINGS: CTA CHEST FINDINGS Cardiovascular: Heart is normal size. Mild calcified plaque over the left anterior descending coronary artery. Minimal calcified plaque over the thoracic aorta. Pulmonary arterial system is adequately opacified and demonstrates no evidence of emboli. Mediastinum/Nodes: No evidence of mediastinal or hilar adenopathy. Remaining mediastinal structures are normal. Lungs/Pleura: Lungs are adequately inflated demonstrate a moderate size right pleural effusion and small left pleural effusion with minimal associated bibasilar  dependent atelectasis. Airways are unremarkable. Musculoskeletal: No focal abnormality. Degenerative changes of the spine. Review of the MIP images confirms the above findings. CT ABDOMEN and PELVIS FINDINGS Hepatobiliary: Known hemangioma slightly smaller over the right lobe of the liver. Previous cholecystectomy. Intrahepatic biliary tree is normal. Stable post cholecystectomy prominence of the common bile duct. Pancreas: Normal. Spleen: Normal. Adrenals/Urinary Tract: Adrenal glands are normal. Kidneys are normal size without hydronephrosis or nephrolithiasis. Subcentimeter hypodensity over the lower pole cortex of the left kidney too small to characterize but likely a cyst and unchanged. Ureters and bladder are normal. Stomach/Bowel: Stomach and small bowel are normal. Moderate diverticulosis of the colon specially over the sigmoid colon. Appendix not visualized. Vascular/Lymphatic: Mild calcified plaque over the abdominal aorta without evidence of aneurysm. No adenopathy. Reproductive: Previous hysterectomy. Other: Mild-to-moderate amount of free fluid in the pelvis and inferior pericolic gutters. Musculoskeletal: No focal abnormality. Degenerative change of the spine with mild multilevel disc disease over the lumbar spine. Review of the MIP images confirms the above findings. IMPRESSION: 1. No evidence of pulmonary embolism. 2. Moderate size right pleural effusion and small left pleural effusion with associated bibasilar atelectasis. 3. No acute findings in the abdomen/pelvis. Mild-to-moderate amount of free fluid in the pelvis and inferior pericolic gutters. 4. Colonic diverticulosis. 5. Subcentimeter left renal cortical hypodensity too small to characterize, but likely a cyst and unchanged. 6. Aortic atherosclerosis. Minimal atherosclerotic coronary artery disease. 7. Known right liver hemangioma slightly smaller. Aortic Atherosclerosis (ICD10-I70.0). Electronically Signed   By: Marin Olp M.D.   On:  12/10/2019 13:12   CT ABDOMEN PELVIS W CONTRAST  Result Date: 12/10/2019 CLINICAL DATA:  Shortness of breath with elevated D-dimer. Fall 1 week ago with left-sided pain. EXAM: CT ANGIOGRAPHY CHEST CT ABDOMEN AND PELVIS WITH CONTRAST TECHNIQUE: Multidetector CT imaging of the chest was performed using the standard protocol during bolus administration of intravenous contrast. Multiplanar CT image reconstructions and MIPs were obtained to evaluate the vascular anatomy. Multidetector CT imaging of the abdomen and pelvis was performed using the standard protocol during bolus administration of intravenous contrast. CONTRAST:  66mL OMNIPAQUE IOHEXOL 350 MG/ML SOLN COMPARISON:  CT chest 11/18/2017 and CT abdomen 09/23/2013 FINDINGS: CTA CHEST FINDINGS Cardiovascular: Heart is normal size. Mild calcified plaque over the left anterior descending coronary artery. Minimal calcified plaque over the thoracic aorta. Pulmonary arterial system is adequately opacified and demonstrates no evidence of emboli. Mediastinum/Nodes: No evidence of mediastinal or hilar adenopathy. Remaining mediastinal structures are normal. Lungs/Pleura: Lungs are adequately inflated demonstrate a moderate size right pleural effusion and small left pleural effusion with minimal associated bibasilar dependent atelectasis. Airways are unremarkable. Musculoskeletal: No focal abnormality. Degenerative changes of the spine. Review of the MIP images confirms the above findings. CT ABDOMEN and PELVIS FINDINGS Hepatobiliary: Known hemangioma slightly smaller over the right  lobe of the liver. Previous cholecystectomy. Intrahepatic biliary tree is normal. Stable post cholecystectomy prominence of the common bile duct. Pancreas: Normal. Spleen: Normal. Adrenals/Urinary Tract: Adrenal glands are normal. Kidneys are normal size without hydronephrosis or nephrolithiasis. Subcentimeter hypodensity over the lower pole cortex of the left kidney too small to  characterize but likely a cyst and unchanged. Ureters and bladder are normal. Stomach/Bowel: Stomach and small bowel are normal. Moderate diverticulosis of the colon specially over the sigmoid colon. Appendix not visualized. Vascular/Lymphatic: Mild calcified plaque over the abdominal aorta without evidence of aneurysm. No adenopathy. Reproductive: Previous hysterectomy. Other: Mild-to-moderate amount of free fluid in the pelvis and inferior pericolic gutters. Musculoskeletal: No focal abnormality. Degenerative change of the spine with mild multilevel disc disease over the lumbar spine. Review of the MIP images confirms the above findings. IMPRESSION: 1. No evidence of pulmonary embolism. 2. Moderate size right pleural effusion and small left pleural effusion with associated bibasilar atelectasis. 3. No acute findings in the abdomen/pelvis. Mild-to-moderate amount of free fluid in the pelvis and inferior pericolic gutters. 4. Colonic diverticulosis. 5. Subcentimeter left renal cortical hypodensity too small to characterize, but likely a cyst and unchanged. 6. Aortic atherosclerosis. Minimal atherosclerotic coronary artery disease. 7. Known right liver hemangioma slightly smaller. Aortic Atherosclerosis (ICD10-I70.0). Electronically Signed   By: Marin Olp M.D.   On: 12/10/2019 13:12    Cardiac Studies     Patient Profile     79 y.o. female with a hx of atrial fibrillation with RVR, HTN, and seizure disorder who is being seen today for the evaluation of atrial fibrillation with RVR  Assessment & Plan    Paroxysmal atrial fibrillation with RVR: Patient initially seen 01/2018 for atrial fibrillation with RVR and acute systolic CHF.  Echocardiogram at that time showed an LVEF at 35 to 40% with severe dilated left atrium.  Nonischemic cardiomyopathy per LHC with normal coronary arteries 02/2018.   She eventually underwent TEE cardioversion 02/2018.  Has been on Eliquis.  Presented with AF with RVR this  admission, converted to sinus rhythm with IV amiodarone -Echocardiogram -Discontinue IV amiodarone.  Will start PO amio 200 mg BID x 2 weeks, then reduce to 200 mg daily -Continue metoprolol, will decrease to 25 mg BID given bradycardia while on metoprolol and amio -Continue Eliquis 5 mg twice daily  Acute on chronic combined systolic and diastolic heart failure: Echocardiogram from 01/2018 with LVEF at 35 to 40% felt to be tachycardia mediated versus viral etiology.  LHC performed 02/22/2018 which showed normal coronary arteries.  Plan was for low-dose ACEI and beta-blocker therapy with repeat echocardiogram several months later but she was lost to follow-up -Repeat echocardiogram -Continue IV lasix 40 mg BID -Monitor daily weight, strict I&O -Will restart GDMT if EF remains reduced on echo  HLD: Last LDL, 75 on 02/18/2018 -Continue pravastatin    For questions or updates, please contact [facility] Please consult [email or web address] for contact info under        Signed, Donato Heinz, MD  12/11/2019, 10:58 AM

## 2019-12-11 NOTE — Progress Notes (Signed)
PROGRESS NOTE    [name]  ERX:[id number] DOB: Sep 22, 1940 DOA: 12/10/2019 PCP: Mayra Neer, MD   Brief Narrative:  Maureen [name] is a 79 y.o. female with medical history significant of status post cardioversion, nonischemic cardiomyopathy with LVEF 35 to 40% February 2020 on Lasix, thyroid nodules, hypertension, seizure disorder, OSA noncompliant with CPAP presented with worsening of shortness of breath and palpitations.  Patient reported her A. fib has not been well controlled recently, as for 2-3 weeks she has experienced frequent palpitations.  She did not seek any cardiology consultation (most recent cardiology visit was last year ) or increased her beta-blocker.  About 1 week ago, she started to feel increasing shortness of breath and wheezing.  This morning she woke up with new onset of chest pain she said it is pressure-like, constant and radiated to the left shoulder.  In time her wheezing and shortness of breath became worse.  She denied any cough no fever chills no edema. In ED: patient was found in rapid A. fib, received Lopressor IV push x2, blood pressure dropped.  Then patient was given 1 L of LR and started on amiodarone drip.  CT angiogram negative for PE but bilateral pleural effusion R>L.   Assessment & Plan:   Active Problems:   A-fib (HCC)   CHF (congestive heart failure) (HCC)   Acute symptomatic A. fib with RVR -Has history of refractory A. fib status post cardioversion x1 last year.    Poorly controlled, questionably provoked in the setting of albuterol -Previously on amiodarone per chart review, discontinued after cardioversion -Amiodarone drip was started in the ED- cardiology following -Continue Eliquis -Thyroid panel within normal limits  Acute on chronic systolic CHF decompensation - Moderate size right pleural effusion and small left pleural effusion with associated bibasilar atelectasis -continue diuresis per cardiology -Postponed echo to tomorrow  once heart rate more controlled -Cardiac wheeze noted yesterday, resolved with diuresis -Atropine as needed -Cardiology help [place] to decide when to start diuresis -Chronic systolic failure likely secondary to nonischemic cardiomyopathy as patient had a normal left-sided cardiac cath last year.  HTN -Hold ACEI given hypotensive episode in ED  Seizure disorder -Keppra  DVT prophylaxis: Eliquis Code Status: Full code Family Communication: None at bedside  Status is: Inpatient  Dispo: The patient is from: Home              Anticipated d/c is to: Home              Anticipated d/c date is: 48 to 72 hours              Patient currently not medically stable for discharge, given ongoing need for further evaluation work-up IV medication and close monitoring  Consultants:   Cardiology  Procedures:   None planned  Antimicrobials:  None indicated  Subjective: No acute issues or events overnight denies nausea, vomiting, diarrhea, constipation, headache, fevers, chills.  Currently tolerating amiodarone drip quite well, previous bradycardia has resolved.  Objective: Vitals:   12/11/19 0036 12/11/19 0500 12/11/19 0516 12/11/19 0805  BP: 130/78  (!) 143/73 134/76  Pulse: (!) 59  (!) 57 61  Resp: 18  16 18   Temp: 97.6 F (36.4 C)  98.1 F (36.7 C) 97.7 F (36.5 C)  TempSrc: Oral  Oral Oral  SpO2: 94%  96% 96%  Weight:  73.1 kg    Height:        Intake/Output Summary (Last 24 hours) at 12/11/2019 9509 Last data filed  at 12/11/2019 0040 Gross per 24 hour  Intake 2337.73 ml  Output --  Net 2337.73 ml   Filed Weights   12/10/19 0910 12/10/19 1700 12/11/19 0500  Weight: 72.6 kg 74 kg 73.1 kg    Examination:  General:  Pleasantly resting in bed, No acute distress. HEENT:  Normocephalic atraumatic.  Sclerae nonicteric, noninjected.  Extraocular movements intact bilaterally. Neck:  Without mass or deformity.  Trachea is midline. Lungs:  Clear to auscultate bilaterally  without rhonchi, wheeze, or rales. Heart:  Regular rate and rhythm.  Without murmurs, rubs, or gallops. Abdomen:  Soft, nontender, nondistended.  Without guarding or rebound. Extremities: Without cyanosis, clubbing, edema, or obvious deformity. Vascular:  Dorsalis pedis and posterior tibial pulses palpable bilaterally. Skin:  Warm and dry, no erythema, no ulcerations.   Data Reviewed: I have personally reviewed following labs and imaging studies  CBC: Recent Labs  Lab 12/10/19 0921  WBC 12.1*  HGB 13.2  HCT 42.4  MCV 90.0  PLT 962*   Basic Metabolic Panel: Recent Labs  Lab 12/10/19 0921  NA 136  K 4.9  CL 103  CO2 21*  GLUCOSE 166*  BUN 48*  CREATININE 1.34*  CALCIUM 9.4  MG 2.1   GFR: Estimated Creatinine Clearance: 30.8 mL/min (A) (by C-G formula based on SCr of 1.34 mg/dL (H)). Liver Function Tests: No results for input(s): AST, ALT, ALKPHOS, BILITOT, PROT, ALBUMIN in the last 168 hours. No results for input(s): LIPASE, AMYLASE in the last 168 hours. No results for input(s): AMMONIA in the last 168 hours. Coagulation Profile: No results for input(s): INR, PROTIME in the last 168 hours. Cardiac Enzymes: No results for input(s): CKTOTAL, CKMB, CKMBINDEX, TROPONINI in the last 168 hours. BNP (last 3 results) No results for input(s): PROBNP in the last 8760 hours. HbA1C: No results for input(s): HGBA1C in the last 72 hours. CBG: No results for input(s): GLUCAP in the last 168 hours. Lipid Profile: No results for input(s): CHOL, HDL, LDLCALC, TRIG, CHOLHDL, LDLDIRECT in the last 72 hours. Thyroid Function Tests: Recent Labs    12/10/19 1800  TSH 1.573  FREET4 1.07   Anemia Panel: No results for input(s): VITAMINB12, FOLATE, FERRITIN, TIBC, IRON, RETICCTPCT in the last 72 hours. Sepsis Labs: No results for input(s): PROCALCITON, LATICACIDVEN in the last 168 hours.  Recent Results (from the past 240 hour(s))  Respiratory Panel by RT PCR (Flu A&B, Covid) -  Nasopharyngeal Swab     Status: None   Collection Time: 12/10/19  9:16 AM   Specimen: Nasopharyngeal Swab; Nasopharyngeal(NP) swabs in vial transport medium  Result Value Ref Range Status   SARS Coronavirus 2 by RT PCR NEGATIVE NEGATIVE Final    Comment: (NOTE) SARS-CoV-2 target nucleic acids are NOT DETECTED.  The SARS-CoV-2 RNA is generally detectable in upper respiratoy specimens during the acute phase of infection. The lowest concentration of SARS-CoV-2 viral copies this assay can detect is 131 copies/mL. A negative result does not preclude SARS-Cov-2 infection and should not be used as the sole basis for treatment or other patient management decisions. A negative result may occur with  improper specimen collection/handling, submission of specimen other than nasopharyngeal swab, presence of viral mutation(s) within the areas targeted by this assay, and inadequate number of viral copies (<131 copies/mL). A negative result must be combined with clinical observations, patient history, and epidemiological information. The expected result is Negative.  Fact Sheet for Patients:  PinkCheek.be  Fact Sheet for Healthcare Providers:  GravelBags.it  This test  is no t yet approved or cleared by the [place] and  has been authorized for detection and/or diagnosis of SARS-CoV-2 by FDA under an Emergency Use Authorization (EUA). This EUA will remain  in effect (meaning this test can be used) for the duration of the COVID-19 declaration under Section 564(b)(1) of the Act, 21 U.S.C. section 360bbb-3(b)(1), unless the authorization is terminated or revoked sooner.     Influenza A by PCR NEGATIVE NEGATIVE Final   Influenza B by PCR NEGATIVE NEGATIVE Final    Comment: (NOTE) The Xpert Xpress SARS-CoV-2/FLU/RSV assay is intended as an aid in  the diagnosis of influenza from Nasopharyngeal swab specimens and  should not be used  as a sole basis for treatment. Nasal washings and  aspirates are unacceptable for Xpert Xpress SARS-CoV-2/FLU/RSV  testing.  Fact Sheet for Patients: PinkCheek.be  Fact Sheet for Healthcare Providers: GravelBags.it  This test is not yet approved or cleared by the [place] FDA and  has been authorized for detection and/or diagnosis of SARS-CoV-2 by  FDA under an Emergency Use Authorization (EUA). This EUA will remain  in effect (meaning this test can be used) for the duration of the  Covid-19 declaration under Section 564(b)(1) of the Act, 21  U.S.C. section 360bbb-3(b)(1), unless the authorization is  terminated or revoked. Performed at [facility], [place] [address] [place], [place] [address]          Radiology Studies: CT Angio Chest PE W and/or Wo Contrast  Result Date: 12/10/2019 CLINICAL DATA:  Shortness of breath with elevated D-dimer. Fall 1 week ago with left-sided pain. EXAM: CT ANGIOGRAPHY CHEST CT ABDOMEN AND PELVIS WITH CONTRAST TECHNIQUE: Multidetector CT imaging of the chest was performed using the standard protocol during bolus administration of intravenous contrast. Multiplanar CT image reconstructions and MIPs were obtained to evaluate the vascular anatomy. Multidetector CT imaging of the abdomen and pelvis was performed using the standard protocol during bolus administration of intravenous contrast. CONTRAST:  71mL OMNIPAQUE IOHEXOL 350 MG/ML SOLN COMPARISON:  CT chest 11/18/2017 and CT abdomen 09/23/2013 FINDINGS: CTA CHEST FINDINGS Cardiovascular: Heart is normal size. Mild calcified plaque over the left anterior descending coronary artery. Minimal calcified plaque over the thoracic aorta. Pulmonary arterial system is adequately opacified and demonstrates no evidence of emboli. Mediastinum/Nodes: No evidence of mediastinal or hilar adenopathy. Remaining mediastinal structures are normal. Lungs/Pleura:  Lungs are adequately inflated demonstrate a moderate size right pleural effusion and small left pleural effusion with minimal associated bibasilar dependent atelectasis. Airways are unremarkable. Musculoskeletal: No focal abnormality. Degenerative changes of the spine. Review of the MIP images confirms the above findings. CT ABDOMEN and PELVIS FINDINGS Hepatobiliary: Known hemangioma slightly smaller over the right lobe of the liver. Previous cholecystectomy. Intrahepatic biliary tree is normal. Stable post cholecystectomy prominence of the common bile duct. Pancreas: Normal. Spleen: Normal. Adrenals/Urinary Tract: Adrenal glands are normal. Kidneys are normal size without hydronephrosis or nephrolithiasis. Subcentimeter hypodensity over the lower pole cortex of the left kidney too small to characterize but likely a cyst and unchanged. Ureters and bladder are normal. Stomach/Bowel: Stomach and small bowel are normal. Moderate diverticulosis of the colon specially over the sigmoid colon. Appendix not visualized. Vascular/Lymphatic: Mild calcified plaque over the abdominal aorta without evidence of aneurysm. No adenopathy. Reproductive: Previous hysterectomy. Other: Mild-to-moderate amount of free fluid in the pelvis and inferior pericolic gutters. Musculoskeletal: No focal abnormality. Degenerative change of the spine with mild multilevel disc disease over the lumbar spine. Review  of the MIP images confirms the above findings. IMPRESSION: 1. No evidence of pulmonary embolism. 2. Moderate size right pleural effusion and small left pleural effusion with associated bibasilar atelectasis. 3. No acute findings in the abdomen/pelvis. Mild-to-moderate amount of free fluid in the pelvis and inferior pericolic gutters. 4. Colonic diverticulosis. 5. Subcentimeter left renal cortical hypodensity too small to characterize, but likely a cyst and unchanged. 6. Aortic atherosclerosis. Minimal atherosclerotic coronary artery  disease. 7. Known right liver hemangioma slightly smaller. Aortic Atherosclerosis (ICD10-I70.0). Electronically Signed   By: Marin Olp M.D.   On: 12/10/2019 13:12   CT ABDOMEN PELVIS W CONTRAST  Result Date: 12/10/2019 CLINICAL DATA:  Shortness of breath with elevated D-dimer. Fall 1 week ago with left-sided pain. EXAM: CT ANGIOGRAPHY CHEST CT ABDOMEN AND PELVIS WITH CONTRAST TECHNIQUE: Multidetector CT imaging of the chest was performed using the standard protocol during bolus administration of intravenous contrast. Multiplanar CT image reconstructions and MIPs were obtained to evaluate the vascular anatomy. Multidetector CT imaging of the abdomen and pelvis was performed using the standard protocol during bolus administration of intravenous contrast. CONTRAST:  6mL OMNIPAQUE IOHEXOL 350 MG/ML SOLN COMPARISON:  CT chest 11/18/2017 and CT abdomen 09/23/2013 FINDINGS: CTA CHEST FINDINGS Cardiovascular: Heart is normal size. Mild calcified plaque over the left anterior descending coronary artery. Minimal calcified plaque over the thoracic aorta. Pulmonary arterial system is adequately opacified and demonstrates no evidence of emboli. Mediastinum/Nodes: No evidence of mediastinal or hilar adenopathy. Remaining mediastinal structures are normal. Lungs/Pleura: Lungs are adequately inflated demonstrate a moderate size right pleural effusion and small left pleural effusion with minimal associated bibasilar dependent atelectasis. Airways are unremarkable. Musculoskeletal: No focal abnormality. Degenerative changes of the spine. Review of the MIP images confirms the above findings. CT ABDOMEN and PELVIS FINDINGS Hepatobiliary: Known hemangioma slightly smaller over the right lobe of the liver. Previous cholecystectomy. Intrahepatic biliary tree is normal. Stable post cholecystectomy prominence of the common bile duct. Pancreas: Normal. Spleen: Normal. Adrenals/Urinary Tract: Adrenal glands are normal. Kidneys are  normal size without hydronephrosis or nephrolithiasis. Subcentimeter hypodensity over the lower pole cortex of the left kidney too small to characterize but likely a cyst and unchanged. Ureters and bladder are normal. Stomach/Bowel: Stomach and small bowel are normal. Moderate diverticulosis of the colon specially over the sigmoid colon. Appendix not visualized. Vascular/Lymphatic: Mild calcified plaque over the abdominal aorta without evidence of aneurysm. No adenopathy. Reproductive: Previous hysterectomy. Other: Mild-to-moderate amount of free fluid in the pelvis and inferior pericolic gutters. Musculoskeletal: No focal abnormality. Degenerative change of the spine with mild multilevel disc disease over the lumbar spine. Review of the MIP images confirms the above findings. IMPRESSION: 1. No evidence of pulmonary embolism. 2. Moderate size right pleural effusion and small left pleural effusion with associated bibasilar atelectasis. 3. No acute findings in the abdomen/pelvis. Mild-to-moderate amount of free fluid in the pelvis and inferior pericolic gutters. 4. Colonic diverticulosis. 5. Subcentimeter left renal cortical hypodensity too small to characterize, but likely a cyst and unchanged. 6. Aortic atherosclerosis. Minimal atherosclerotic coronary artery disease. 7. Known right liver hemangioma slightly smaller. Aortic Atherosclerosis (ICD10-I70.0). Electronically Signed   By: Marin Olp M.D.   On: 12/10/2019 13:12        Scheduled Meds: . apixaban  5 mg Oral BID  . calcium-vitamin D  1 tablet Oral BID  . furosemide  40 mg Intravenous Daily  . levETIRAcetam  500 mg Oral BID  . metoprolol tartrate  50 mg Oral BID  .  multivitamin with minerals  1 tablet Oral Daily  . pravastatin  40 mg Oral Daily  . sodium chloride flush  3 mL Intravenous Q12H   Continuous Infusions: . sodium chloride    . amiodarone 30 mg/hr (12/11/19 0040)     LOS: 1 day   Time spent: 20min  Sharlett Lienemann C Tashana Haberl,  DO [facility]  If 7PM-7AM, please contact night-coverage [email or web address]  12/11/2019, 8:07 AM

## 2019-12-11 NOTE — Evaluation (Signed)
Physical Therapy Evaluation Patient Details Name: Maureen [name] MRN: [id number] DOB: 1941/01/18 Today's Date: 12/11/2019   History of Present Illness  Maureen [name] is a 79 y.o. female with medical history significant of status post cardioversion, nonischemic cardiomyopathy with LVEF 35 to 40% February 2020 on Lasix, thyroid nodules, hypertension, seizure disorder, OSA noncompliant with CPAP presented with worsening of shortness of breath and palpitations. Admitted with A. fib with RVR and acute on chronic systolic CHF decompensation.  Clinical Impression  Patient evaluated by Physical Therapy with no further acute PT needs identified. 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 Pt ambulating 500 feet with no assistive device independently. SpO2 90-93% on RA, HR 67-117 (irregular). Pt reports mildly decreased endurance and dyspnea on exertion compared to baseline. Encouraged 3x/day walks daily while inpatient. All education has been completed and the patient has no further questions. No follow-up Physical Therapy or equipment needs. PT is signing off. Thank you for this referral.    Follow Up Recommendations No PT follow up    Equipment Recommendations  None recommended by PT    Recommendations for Other Services       Precautions / Restrictions Precautions Precautions: Other (comment) Precaution Comments: watch HR Restrictions Weight Bearing Restrictions: No      Mobility  Bed Mobility               General bed mobility comments: Received standing in room    Transfers Overall transfer level: Independent Equipment used: None                Ambulation/Gait Ambulation/Gait assistance: Independent Gait Distance (Feet): 500 Feet Assistive device: None Gait Pattern/deviations: WFL(Within Functional Limits)     General Gait Details: Steady pace, no gross imbalance noted  Stairs            Wheelchair  Mobility    Modified Rankin (Stroke Patients Only)       Balance Overall balance assessment: No apparent balance deficits (not formally assessed)                                           Pertinent Vitals/Pain Pain Assessment: No/denies pain    Home Living Family/patient expects to be discharged to:: Private residence Living Arrangements: Spouse/significant other Available Help at Discharge: Family;Available PRN/intermittently (son) Type of Home: House Home Access: Ramped entrance     Home Layout: Able to live on main level with bedroom/bathroom Home Equipment: Tub bench;Grab bars - tub/shower;Walker - 2 wheels;Cane - single point      Prior Function Level of Independence: Independent         Comments: 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 Enjoys walking     Hand Dominance        Extremity/Trunk Assessment   Upper Extremity Assessment Upper Extremity Assessment: Overall WFL for tasks assessed    Lower Extremity Assessment Lower Extremity Assessment: Overall WFL for tasks assessed    Cervical / Trunk Assessment Cervical / Trunk Assessment: Normal  Communication   Communication: No difficulties  Cognition Arousal/Alertness: Awake/alert Behavior During Therapy: WFL for tasks assessed/performed Overall Cognitive Status: Within Functional Limits for tasks assessed  General Comments      Exercises     Assessment/Plan    PT Assessment Patent does not need any further PT services  PT Problem List         PT Treatment Interventions      PT Goals (Current goals can be found in the Care Plan section)  Acute Rehab PT Goals Patient Stated Goal: back to independence PT Goal Formulation: All assessment and education complete, DC therapy    Frequency     Barriers to discharge        Co-evaluation               AM-PAC PT "6 Clicks" Mobility  Outcome Measure Help needed  turning from your back to your side while in a flat bed without using bedrails?: None Help needed moving from lying on your back to sitting on the side of a flat bed without using bedrails?: None Help needed moving to and from a bed to a chair (including a wheelchair)?: None Help needed standing up from a chair using your arms (e.g., wheelchair or bedside chair)?: None Help needed to walk in hospital room?: None Help needed climbing 3-5 steps with a railing? : None 6 Click Score: 24    End of Session   Activity Tolerance: Patient tolerated treatment well Patient left: Other (comment) (in bathroom (independent)) Nurse Communication: Mobility status PT Visit Diagnosis: Difficulty in walking, not elsewhere classified (R26.2)    Time: 8841-6606 PT Time Calculation (min) (ACUTE ONLY): 17 min   Charges:   PT Evaluation $PT Eval Moderate Complexity: 1 Mod          Wyona Almas, PT, DPT Acute Rehabilitation Services Pager [phone] Office [phone]   Deno Etienne 12/11/2019, 10:11 AM

## 2019-12-12 LAB — BASIC METABOLIC PANEL
Anion gap: 10 (ref 5–15)
BUN: 47 mg/dL — ABNORMAL HIGH (ref 8–23)
CO2: 26 mmol/L (ref 22–32)
Calcium: 9.3 mg/dL (ref 8.9–10.3)
Chloride: 102 mmol/L (ref 98–111)
Creatinine, Ser: 1.36 mg/dL — ABNORMAL HIGH (ref 0.44–1.00)
GFR, Estimated: 39 mL/min — ABNORMAL LOW (ref >60)
Glucose, Bld: 137 mg/dL — ABNORMAL HIGH (ref 70–99)
Potassium: 3.5 mmol/L (ref 3.5–5.1)
Sodium: 138 mmol/L (ref 135–145)

## 2019-12-12 LAB — CBC
HCT: 35.5 % — ABNORMAL LOW (ref 36.0–46.0)
Hemoglobin: 11.4 g/dL — ABNORMAL LOW (ref 12.0–15.0)
MCH: 28.1 pg (ref 26.0–34.0)
MCHC: 32.1 g/dL (ref 30.0–36.0)
MCV: 87.7 fL (ref 80.0–100.0)
Platelets: 266 10*3/uL (ref 150–400)
RBC: 4.05 MIL/uL (ref 3.87–5.11)
RDW: 14.2 % (ref 11.5–15.5)
WBC: 8.3 10*3/uL (ref 4.0–10.5)
nRBC: 0.4 % — ABNORMAL HIGH (ref 0.0–0.2)

## 2019-12-12 LAB — T3, FREE: T3, Free: 1.8 pg/mL — ABNORMAL LOW (ref 2.0–4.4)

## 2019-12-12 MED ORDER — FUROSEMIDE 40 MG PO TABS
40.0000 mg | ORAL_TABLET | Freq: Every day | ORAL | 3 refills | Status: DC
Start: 2019-12-12 — End: 2021-10-23

## 2019-12-12 MED ORDER — LOSARTAN POTASSIUM 25 MG PO TABS
25.0000 mg | ORAL_TABLET | Freq: Every day | ORAL | Status: DC
  Administered 2019-12-12: 25 mg via ORAL
  Filled 2019-12-12: qty 1

## 2019-12-12 MED ORDER — AMIODARONE HCL 200 MG PO TABS: 200.0000 mg | ORAL_TABLET | Freq: Every day | ORAL | 3 refills | Status: DC

## 2019-12-12 MED ORDER — METOPROLOL SUCCINATE ER 50 MG PO TB24
50.0000 mg | ORAL_TABLET | Freq: Every day | ORAL | 3 refills | Status: DC
Start: 2019-12-12 — End: 2019-12-21

## 2019-12-12 MED ORDER — LOSARTAN POTASSIUM 25 MG PO TABS
25.0000 mg | ORAL_TABLET | Freq: Every day | ORAL | 3 refills | Status: DC
Start: 2019-12-13 — End: 2020-02-03

## 2019-12-12 MED ORDER — AMIODARONE HCL 200 MG PO TABS: 200.0000 mg | ORAL_TABLET | Freq: Two times a day (BID) | ORAL | 0 refills | Status: DC

## 2019-12-12 NOTE — Plan of Care (Signed)
°  Problem: Education: Goal: Ability to demonstrate management of disease process will improve Outcome: Adequate for Discharge Goal: Ability to verbalize understanding of medication therapies will improve Outcome: Adequate for Discharge Goal: Individualized Educational Video(s) Outcome: Adequate for Discharge

## 2019-12-12 NOTE — Progress Notes (Signed)
D/C instructions given and reviewed. No questions asked but encouraged to call with any concerns. IV's removed, tolerated well.

## 2019-12-12 NOTE — Discharge Instructions (Signed)

## 2019-12-12 NOTE — Progress Notes (Signed)
  Amiodarone Drug - Drug Interaction Consult Note  Recommendations: None  Amiodarone is metabolized by the cytochrome P450 system and therefore has the potential to cause many drug interactions. Amiodarone has an average plasma half-life of 50 days (range 20 to 100 days).   There is potential for drug interactions to occur several weeks or months after stopping treatment and the onset of drug interactions may be slow after initiating amiodarone.   [x]  Statins: Increased risk of myopathy. Simvastatin- restrict dose to 20mg  daily. Other statins: counsel patients to report any muscle pain or weakness immediately.  [x]  Anticoagulants: Amiodarone can increase anticoagulant effect. Consider warfarin dose reduction. Patients should be monitored closely and the dose of anticoagulant altered accordingly, remembering that amiodarone levels take several weeks to stabilize.  [x]  Antiepileptics: Amiodarone can increase plasma concentration of phenytoin, the dose should be reduced. Note that small changes in phenytoin dose can result in large changes in levels. Monitor patient and counsel on signs of toxicity.  [x]  Beta blockers: increased risk of bradycardia, AV block and myocardial depression. Sotalol - avoid concomitant use.  []   Calcium channel blockers (diltiazem and verapamil): increased risk of bradycardia, AV block and myocardial depression.  []   Cyclosporine: Amiodarone increases levels of cyclosporine. Reduced dose of cyclosporine is recommended.  []  Digoxin dose should be halved when amiodarone is started.  [x]  Diuretics: increased risk of cardiotoxicity if hypokalemia occurs.  []  Oral hypoglycemic agents (glyburide, glipizide, glimepiride): increased risk of hypoglycemia. Patient's glucose levels should be monitored closely when initiating amiodarone therapy.   []  Drugs that prolong the QT interval:  Torsades de pointes risk may be increased with concurrent use - avoid if possible.  Monitor  QTc, also keep magnesium/potassium WNL if concurrent therapy can't be avoided. Marland Kitchen Antibiotics: e.g. fluoroquinolones, erythromycin. . Antiarrhythmics: e.g. quinidine, procainamide, disopyramide, sotalol. . Antipsychotics: e.g. phenothiazines, haloperidol.  . Lithium, tricyclic antidepressants, and methadone.   Ammon Muscatello S. Alford Highland, PharmD, BCPS Clinical Staff Pharmacist [email or web address] Thank You,  Wayland Salinas  12/12/2019 11:03 AM

## 2019-12-12 NOTE — Progress Notes (Signed)
CARDIOLOGY OFFICE NOTE  Date:  12/21/2019    Maureen [name] Date of Birth: 01/17/1941 Medical Record #[id number]  PCP:  Mayra Neer, MD  Cardiologist:  Ellyn Hack  Chief Complaint  Patient presents with  . Hospitalization Follow-up    Seen for Dr. Ellyn Hack    History of Present Illness: Maureen [name] is a 79 y.o. female who presents today for a post hospital visit. Was to be TOC - however, no phone call noted.   Seen for Dr. Ellyn Hack.   She has a history of atrial fibrillation with RVR, NICM, chronic combined systolic and distolic HF, HTN, and seizure disorder. She has had extreme stress with caring for her demented husband. She has had prior echo with EF of 35 to 40% noted. She has had prior TEE cardioversion in 02/2018. She has been on Coumadin in the past - transitioned over to [place] after stable on Keppra for her seizure disorder.   Last seen by Dr. Ellyn Hack by a telehealth visit in April of 2020.   Presented earlier this month with AF with RVR and SOB. Placed on amiodarone. ACE changed to ARB. EF about 45%.   Comes in today. Here with her husband - he is in a neck brace. She feels like she is progressing. Less dyspnea. Less dizziness. Weight is back down. No chest pain. Cutting her dose of Amiodarone back next week to once a day. She has no real concerns today. We have reviewed her echo. She was never able to complete sleep study and declines CPAP.   Past Medical History:  Diagnosis Date  . Atrial fibrillation ([place])   . Benign tumor of meninges ([place])   . Chronic combined systolic and diastolic CHF, NYHA class 2 and ACA/AHA stage C 01/2018   Exacerbated by A. fib; EF 35 to 40% with global HK.  Reduced cardiac output and index (3.2/1.9).   -Associated moderate pulmonary hypertension-(mean PA P 38 mmHg)  . GERD (gastroesophageal reflux disease)    occ. in past  . Headache(784.0)   . HTN (hypertension), benign    pt. states no hypertension  . NICM (nonischemic  cardiomyopathy) ([place]) 01/2018   Echo with EF of 35 to 40%.  Global HK.  Mild to moderate MR.  Mild nonobstructive CAD by cath.  Cardiac output/index 3.2/1.9  . Pneumonia    ? as child  . PONV (postoperative nausea and vomiting)   . Seizures ([place])    positive for HBP    Past Surgical History:  Procedure Laterality Date  . CARDIOVERSION N/A 02/24/2018   Procedure: CARDIOVERSION;  Surgeon: Sueanne Margarita, MD;  Location: [facility] ENDOSCOPY;  Service: Cardiovascular;  Laterality: N/A;  . CATARACT EXTRACTION, BILATERAL Bilateral   . CHOLECYSTECTOMY    . COLONOSCOPY WITH PROPOFOL N/A 11/30/2012   Procedure: COLONOSCOPY WITH PROPOFOL;  Surgeon: Garlan Fair, MD;  Location: [facility] ENDOSCOPY;  Service: Endoscopy;  Laterality: N/A;  . ESOPHAGOGASTRODUODENOSCOPY (EGD) WITH PROPOFOL N/A 11/30/2012   Procedure: ESOPHAGOGASTRODUODENOSCOPY (EGD) WITH PROPOFOL;  Surgeon: Garlan Fair, MD;  Location: [facility] ENDOSCOPY;  Service: Endoscopy;  Laterality: N/A;  . ESOPHAGOGASTRODUODENOSCOPY (EGD) WITH PROPOFOL N/A 03/14/2015   Procedure: ESOPHAGOGASTRODUODENOSCOPY (EGD) WITH PROPOFOL;  Surgeon: Arta Silence, MD;  Location: [facility] ENDOSCOPY;  Service: Endoscopy;  Laterality: N/A;  . EUS N/A 01/05/2013   Procedure: FULL UPPER ENDOSCOPIC ULTRASOUND (EUS) RADIAL;  Surgeon: Arta Silence, MD;  Location: [facility] ENDOSCOPY;  Service: Endoscopy;  Laterality: N/A;  EUS with possible FNA  . EUS  N/A 03/14/2015   Procedure: UPPER ENDOSCOPIC ULTRASOUND (EUS) RADIAL;  Surgeon: Arta Silence, MD;  Location: [facility] ENDOSCOPY;  Service: Endoscopy;  Laterality: N/A;  . EYE SURGERY Left    "few weeks ago scar tissue laser surgery"  . FINE NEEDLE ASPIRATION N/A 01/05/2013   Procedure: FINE NEEDLE ASPIRATION (FNA) RADIAL;  Surgeon: Arta Silence, MD;  Location: [facility] ENDOSCOPY;  Service: Endoscopy;  Laterality: N/A;  . KNEE ARTHROSCOPY Left   . [place]  12/1990  . RIGHT/LEFT HEART CATH AND CORONARY ANGIOGRAPHY N/A 02/22/2018    Procedure: RIGHT/LEFT HEART CATH AND CORONARY ANGIOGRAPHY;  Surgeon: Larey Dresser, MD;  Location: [facility] INVASIVE CV LAB;;  Nonobstructive CAD.  Mild to moderate pulmonary hypertension (PA P 47/24 mmHg-mean 38 mmHg.  PCWP 22 mmHg with LVEDP 24 mmHg. CO/CI = 3.2/1.9  . TEE WITHOUT CARDIOVERSION N/A 02/24/2018   Procedure: TRANSESOPHAGEAL ECHOCARDIOGRAM (TEE) - WITH DCCV;  Surgeon: Sueanne Margarita, MD;  Location: [facility] ENDOSCOPY;; EF 35-40%.  Global HK.  No R WMA. Mild AI, Mild-Mod MR. Severe LA dilation (no LAA thrombus).  Severely reduced RV function.  Mild RA dilation --successful DCCV  . TRANSTHORACIC ECHOCARDIOGRAM  02/18/2018   EF 35 to 40%.  Unable to assess diastolic function because of A. fib.  No obvious regional wall motion abnormality.  Global hypokinesis.  Severe LA dilation.  Mild-moderate MR.  Mild AI without AS  . VAGINAL HYSTERECTOMY  1983     Medications: Current Meds  Medication Sig  . amiodarone (PACERONE) 200 MG tablet Take 1 tablet (200 mg total) by mouth 2 (two) times daily for 14 days.  Derrill Memo ON 12/27/2019] amiodarone (PACERONE) 200 MG tablet Take 1 tablet (200 mg total) by mouth daily.  . calcium-vitamin D (OSCAL WITH D) 500-200 MG-UNIT per tablet Take 1 tablet by mouth 2 (two) times daily.   Marland Kitchen ELIQUIS 5 MG TABS tablet TAKE 1 TABLET BY MOUTH TWICE DAILY TO REPLACE WARFARIN  . furosemide (LASIX) 40 MG tablet Take 1 tablet (40 mg total) by mouth daily.  Marland Kitchen levETIRAcetam (KEPPRA) 500 MG tablet Take 1 tablet (500 mg total) by mouth 2 (two) times daily.  Marland Kitchen losartan (COZAAR) 25 MG tablet Take 1 tablet (25 mg total) by mouth daily.  . Multiple Vitamin (MULTIVITAMIN WITH MINERALS) TABS tablet Take 1 tablet by mouth daily.  . pravastatin (PRAVACHOL) 40 MG tablet Take 40 mg by mouth daily.   Marland Kitchen triamcinolone ointment (KENALOG) 0.5 % Apply topically.  . [DISCONTINUED] metoprolol succinate (TOPROL-XL) 50 MG 24 hr tablet Take 1 tablet (50 mg total) by mouth daily.      Allergies: Allergies  Allergen Reactions  . Dilantin [Phenytoin Sodium Extended]      Stupor, ataxia.   Marland Kitchen Hydrocodone Nausea And Vomiting    Social History: The patient  reports that she has never smoked. She has never used smokeless tobacco. She reports current alcohol use. She reports that she does not use drugs.   Family History: The patient's family history includes Dementia in her father; Heart disease in an other family member.   Review of Systems: Please see the history of present illness.   All other systems are reviewed and negative.   Physical Exam: VS:  BP 120/80   Pulse (!) 48   Ht 5\' 1"  (1.549 m)   Wt 149 lb 12.8 oz (67.9 kg)   SpO2 99%   BMI 28.30 kg/m  .  BMI Body mass index is 28.3 kg/m.  Wt Readings from  Last 3 Encounters:  12/21/19 149 lb 12.8 oz (67.9 kg)  12/12/19 158 lb (71.7 kg)  06/28/19 155 lb (70.3 kg)    General: Alert and in no acute distress. Weight was 147 at her last visit in 2020.  She looks good.  Cardiac: Regular rate and rhythm. No murmurs, rubs, or gallops. No edema.  Respiratory:  Lungs are clear to auscultation bilaterally with normal work of breathing.  GI: Soft and nontender.  MS: No deformity or atrophy. Gait and ROM intact.  Skin: Warm and dry. Color is normal.  Neuro:  Strength and sensation are intact and no gross focal deficits noted.  Psych: Alert, appropriate and with normal affect.   LABORATORY DATA:  EKG:  EKG is ordered today.  Personally reviewed by me. This demonstrates sinus bradycardia - HR is 48. Septal Q's.   Lab Results  Component Value Date   WBC 8.3 12/12/2019   HGB 11.4 (L) 12/12/2019   HCT 35.5 (L) 12/12/2019   PLT 266 12/12/2019   GLUCOSE 137 (H) 12/12/2019   CHOL 144 02/18/2018   TRIG 71 02/18/2018   HDL 55 02/18/2018   LDLCALC 75 02/18/2018   ALT 19 08/31/2017   AST 19 08/31/2017   NA 138 12/12/2019   K 3.5 12/12/2019   CL 102 12/12/2019   CREATININE 1.36 (H) 12/12/2019   BUN 47 (H)  12/12/2019   CO2 26 12/12/2019   TSH 1.573 12/10/2019   INR 3.8 (A) 08/05/2018       BNP (last 3 results) No results for input(s): BNP in the last 8760 hours.  ProBNP (last 3 results) No results for input(s): PROBNP in the last 8760 hours.   Other Studies Reviewed Today:  Echo 11/2019:   1. Left ventricular ejection fraction, by estimation, is 45 to 50%. The  left ventricle has mildly decreased function. The left ventricle  demonstrates global hypokinesis. There is mild left ventricular  hypertrophy. Left ventricular diastolic parameters  are consistent with Grade III diastolic dysfunction (restrictive).  Elevated left atrial pressure.  2. Right ventricular systolic function is mildly reduced. The right  ventricular size is normal. There is severely elevated pulmonary artery  systolic pressure. The estimated right ventricular systolic pressure is  81.8 mmHg.  3. The mitral valve is abnormal. Moderate mitral valve regurgitation.  4. Tricuspid valve regurgitation is moderate.  5. Pulmonic valve regurgitation is mild to moderate.  6. The aortic valve is tricuspid. Aortic valve regurgitation is mild. No  aortic stenosis is present.  7. The inferior vena cava is dilated in size with <50% respiratory  variability, suggesting right atrial pressure of 15 mmHg.     Assessment & Plan    1. PAF - has had recent RVR with associated heart failure - now on tapering doses of Amiodarone. HR is pretty slow - cutting Toprol back to 25 mg - EKG on return. Needs baseline lab rechecked today. TSH was fine.   2. Chronic combined systolic and diastolic HF - EF 45 to 56% - weight back down - suspect some of this was due to her tachycardia.   3. Chronic anticoagulation - no problems noted - lab today.   4. Situational stress - caring for husband.   5. Profound bradycardia - cutting Toprol back.   Current medicines are reviewed with the patient today.  The patient does not have  concerns regarding medicines other than what has been noted above.  The following changes have been made:  See above.  Labs/ tests ordered today include:    Orders Placed This Encounter  Procedures  . Basic metabolic panel  . CBC  . Hepatic function panel  . EKG 12-Lead     Disposition:   FU with Dr. Ellyn Hack in a month with repeat EKG. Lab today. Toprol cut back.    Patient is agreeable to this plan and will call if any problems develop in the interim.   SignedTruitt Merle, NP  12/21/2019 11:13 AM  [address] [place], [place]  [address] Phone: [phone] Fax: [phone]

## 2019-12-12 NOTE — Hospital Course (Signed)
Maureen [name] is a 79 y.o. female with medical history significant of status post cardioversion, nonischemic cardiomyopathy with LVEF 35 to 40% February 2020 on Lasix, thyroid nodules, hypertension, seizure disorder, OSA noncompliant with CPAP who presented with worsening of shortness of breath and palpitations.   Patient reported her A. fib has not been well controlled recently, as for 2-3 weeks she has experienced frequent palpitations.  She did not seek any cardiology consultation (most recent cardiology visit was last year ) or increased her beta-blocker.  About 1 week ago, she started to feel increasing shortness of breath and wheezing.   On admission she had developed chest pain radiating to her shoulder and wheezing/shortness of breath.  On work-up she was found to be in A. fib with RVR.  She was treated with beta-blocker, fluids, and ultimately required amiodarone drip.  CTA was performed which was negative for PE but showed bilateral pleural effusions. She was evaluated by cardiology during hospitalization.  At discharge she was recommended to continue on oral amiodarone. Her Toprol dose was decreased to 50 mg daily, this may need to be further adjusted at follow-up as well. Per cardiology, her lisinopril was also discontinued and she was started on losartan instead. She is also continued on Lasix 40 mg daily at discharge.  This was previously as needed at home prior to admission. She was instructed to keep a weight log daily and bring this with her to follow-up.

## 2019-12-12 NOTE — Progress Notes (Signed)
Progress Note  Patient Name: Maureen [name] Date of Encounter: 12/12/2019  Primary Cardiologist:   Glenetta Hew, MD   Subjective   She feels good and is anxious to go home.  No pain, no SOB.   Inpatient Medications    Scheduled Meds: . amiodarone  200 mg Oral BID  . apixaban  5 mg Oral BID  . calcium-vitamin D  1 tablet Oral BID  . furosemide  40 mg Intravenous BID  . levETIRAcetam  500 mg Oral BID  . metoprolol tartrate  25 mg Oral BID  . multivitamin with minerals  1 tablet Oral Daily  . pravastatin  40 mg Oral Daily  . sodium chloride flush  3 mL Intravenous Q12H   Continuous Infusions: . sodium chloride     PRN Meds: sodium chloride, acetaminophen, ipratropium, metoprolol tartrate, ondansetron (ZOFRAN) IV, sodium chloride flush   Vital Signs    Vitals:   12/11/19 1632 12/11/19 2009 12/12/19 0451 12/12/19 0457  BP: 138/74 (!) 145/76 116/68   Pulse: 63 67 (!) 59   Resp: 18 20 18    Temp: 97.9 F (36.6 C) (!) 97.4 F (36.3 C) 97.6 F (36.4 C)   TempSrc: Oral Oral Oral   SpO2: 96% 95% 94%   Weight:    71.7 kg  Height:        Intake/Output Summary (Last 24 hours) at 12/12/2019 0750 Last data filed at 12/12/2019 0518 Gross per 24 hour  Intake 700 ml  Output 1000 ml  Net -300 ml   Filed Weights   12/10/19 1700 12/11/19 0500 12/12/19 0457  Weight: 74 kg 73.1 kg 71.7 kg    Telemetry    NSR - Personally Reviewed  ECG    NA - Personally Reviewed  Physical Exam   GEN: No acute distress.   Neck: No  JVD Cardiac: RRR, no murmurs, rubs, or gallops.  Respiratory: Clear  to auscultation bilaterally. GI: Soft, nontender, non-distended  MS:    Trace leg edema; No deformity. Neuro:  Nonfocal  Psych: Normal affect   Labs    Chemistry Recent Labs  Lab 12/10/19 0921 12/11/19 0818 12/12/19 0454  NA 136 136 138  K 4.9 4.2 3.5  CL 103 105 102  CO2 21* 21* 26  GLUCOSE 166* 133* 137*  BUN 48* 48* 47*  CREATININE 1.34* 1.33* 1.36*  CALCIUM 9.4  9.1 9.3  GFRNONAA 40* 40* 39*  ANIONGAP 12 10 10      Hematology Recent Labs  Lab 12/10/19 0921 12/12/19 0454  WBC 12.1* 8.3  RBC 4.71 4.05  HGB 13.2 11.4*  HCT 42.4 35.5*  MCV 90.0 87.7  MCH 28.0 28.1  MCHC 31.1 32.1  RDW 14.4 14.2  PLT 410* 266    Cardiac EnzymesNo results for input(s): TROPONINI in the last 168 hours. No results for input(s): TROPIPOC in the last 168 hours.   BNPNo results for input(s): BNP, PROBNP in the last 168 hours.   DDimer  Recent Labs  Lab 12/10/19 0921  DDIMER 1.60*     Radiology    CT Angio Chest PE W and/or Wo Contrast  Result Date: 12/10/2019 CLINICAL DATA:  Shortness of breath with elevated D-dimer. Fall 1 week ago with left-sided pain. EXAM: CT ANGIOGRAPHY CHEST CT ABDOMEN AND PELVIS WITH CONTRAST TECHNIQUE: Multidetector CT imaging of the chest was performed using the standard protocol during bolus administration of intravenous contrast. Multiplanar CT image reconstructions and MIPs were obtained to evaluate the vascular anatomy. Multidetector CT  imaging of the abdomen and pelvis was performed using the standard protocol during bolus administration of intravenous contrast. CONTRAST:  21mL OMNIPAQUE IOHEXOL 350 MG/ML SOLN COMPARISON:  CT chest 11/18/2017 and CT abdomen 09/23/2013 FINDINGS: CTA CHEST FINDINGS Cardiovascular: Heart is normal size. Mild calcified plaque over the left anterior descending coronary artery. Minimal calcified plaque over the thoracic aorta. Pulmonary arterial system is adequately opacified and demonstrates no evidence of emboli. Mediastinum/Nodes: No evidence of mediastinal or hilar adenopathy. Remaining mediastinal structures are normal. Lungs/Pleura: Lungs are adequately inflated demonstrate a moderate size right pleural effusion and small left pleural effusion with minimal associated bibasilar dependent atelectasis. Airways are unremarkable. Musculoskeletal: No focal abnormality. Degenerative changes of the spine.  Review of the MIP images confirms the above findings. CT ABDOMEN and PELVIS FINDINGS Hepatobiliary: Known hemangioma slightly smaller over the right lobe of the liver. Previous cholecystectomy. Intrahepatic biliary tree is normal. Stable post cholecystectomy prominence of the common bile duct. Pancreas: Normal. Spleen: Normal. Adrenals/Urinary Tract: Adrenal glands are normal. Kidneys are normal size without hydronephrosis or nephrolithiasis. Subcentimeter hypodensity over the lower pole cortex of the left kidney too small to characterize but likely a cyst and unchanged. Ureters and bladder are normal. Stomach/Bowel: Stomach and small bowel are normal. Moderate diverticulosis of the colon specially over the sigmoid colon. Appendix not visualized. Vascular/Lymphatic: Mild calcified plaque over the abdominal aorta without evidence of aneurysm. No adenopathy. Reproductive: Previous hysterectomy. Other: Mild-to-moderate amount of free fluid in the pelvis and inferior pericolic gutters. Musculoskeletal: No focal abnormality. Degenerative change of the spine with mild multilevel disc disease over the lumbar spine. Review of the MIP images confirms the above findings. IMPRESSION: 1. No evidence of pulmonary embolism. 2. Moderate size right pleural effusion and small left pleural effusion with associated bibasilar atelectasis. 3. No acute findings in the abdomen/pelvis. Mild-to-moderate amount of free fluid in the pelvis and inferior pericolic gutters. 4. Colonic diverticulosis. 5. Subcentimeter left renal cortical hypodensity too small to characterize, but likely a cyst and unchanged. 6. Aortic atherosclerosis. Minimal atherosclerotic coronary artery disease. 7. Known right liver hemangioma slightly smaller. Aortic Atherosclerosis (ICD10-I70.0). Electronically Signed   By: Marin Olp M.D.   On: 12/10/2019 13:12   CT ABDOMEN PELVIS W CONTRAST  Result Date: 12/10/2019 CLINICAL DATA:  Shortness of breath with elevated  D-dimer. Fall 1 week ago with left-sided pain. EXAM: CT ANGIOGRAPHY CHEST CT ABDOMEN AND PELVIS WITH CONTRAST TECHNIQUE: Multidetector CT imaging of the chest was performed using the standard protocol during bolus administration of intravenous contrast. Multiplanar CT image reconstructions and MIPs were obtained to evaluate the vascular anatomy. Multidetector CT imaging of the abdomen and pelvis was performed using the standard protocol during bolus administration of intravenous contrast. CONTRAST:  20mL OMNIPAQUE IOHEXOL 350 MG/ML SOLN COMPARISON:  CT chest 11/18/2017 and CT abdomen 09/23/2013 FINDINGS: CTA CHEST FINDINGS Cardiovascular: Heart is normal size. Mild calcified plaque over the left anterior descending coronary artery. Minimal calcified plaque over the thoracic aorta. Pulmonary arterial system is adequately opacified and demonstrates no evidence of emboli. Mediastinum/Nodes: No evidence of mediastinal or hilar adenopathy. Remaining mediastinal structures are normal. Lungs/Pleura: Lungs are adequately inflated demonstrate a moderate size right pleural effusion and small left pleural effusion with minimal associated bibasilar dependent atelectasis. Airways are unremarkable. Musculoskeletal: No focal abnormality. Degenerative changes of the spine. Review of the MIP images confirms the above findings. CT ABDOMEN and PELVIS FINDINGS Hepatobiliary: Known hemangioma slightly smaller over the right lobe of the liver. Previous cholecystectomy. Intrahepatic biliary  tree is normal. Stable post cholecystectomy prominence of the common bile duct. Pancreas: Normal. Spleen: Normal. Adrenals/Urinary Tract: Adrenal glands are normal. Kidneys are normal size without hydronephrosis or nephrolithiasis. Subcentimeter hypodensity over the lower pole cortex of the left kidney too small to characterize but likely a cyst and unchanged. Ureters and bladder are normal. Stomach/Bowel: Stomach and small bowel are normal. Moderate  diverticulosis of the colon specially over the sigmoid colon. Appendix not visualized. Vascular/Lymphatic: Mild calcified plaque over the abdominal aorta without evidence of aneurysm. No adenopathy. Reproductive: Previous hysterectomy. Other: Mild-to-moderate amount of free fluid in the pelvis and inferior pericolic gutters. Musculoskeletal: No focal abnormality. Degenerative change of the spine with mild multilevel disc disease over the lumbar spine. Review of the MIP images confirms the above findings. IMPRESSION: 1. No evidence of pulmonary embolism. 2. Moderate size right pleural effusion and small left pleural effusion with associated bibasilar atelectasis. 3. No acute findings in the abdomen/pelvis. Mild-to-moderate amount of free fluid in the pelvis and inferior pericolic gutters. 4. Colonic diverticulosis. 5. Subcentimeter left renal cortical hypodensity too small to characterize, but likely a cyst and unchanged. 6. Aortic atherosclerosis. Minimal atherosclerotic coronary artery disease. 7. Known right liver hemangioma slightly smaller. Aortic Atherosclerosis (ICD10-I70.0). Electronically Signed   By: Marin Olp M.D.   On: 12/10/2019 13:12   ECHOCARDIOGRAM COMPLETE  Result Date: 12/11/2019    ECHOCARDIOGRAM REPORT   Patient Name:   Maureen [name] Date of Exam: 12/11/2019 Medical Rec #:  [id number]      Height:       62.0 in Accession #:    [id number]     Weight:       161.2 lb Date of Birth:  1940-11-19      BSA:          1.744 m Patient Age:    79 years       BP:           134/76 mmHg Patient Gender: F              HR:           54 bpm. Exam Location:  Inpatient Procedure: 2D Echo, Cardiac Doppler and Color Doppler Indications:    I50.40* Unspecified combined systolic (congestive) and diastolic                 (congestive) heart failure  History:        Patient has prior history of Echocardiogram examinations, most                 recent 02/24/2018. CHF and Cardiomyopathy, Abnormal ECG,                  Arrythmias:Atrial Fibrillation; Signs/Symptoms:Dyspnea and                 Shortness of Breath.  Sonographer:    Roseanna Rainbow RDCS Referring Phys: [id number] [place] Comments: Technically difficult study due to poor echo windows. Image acquisition challenging due to patient body habitus. IMPRESSIONS  1. Left ventricular ejection fraction, by estimation, is 45 to 50%. The left ventricle has mildly decreased function. The left ventricle demonstrates global hypokinesis. There is mild left ventricular hypertrophy. Left ventricular diastolic parameters are consistent with Grade III diastolic dysfunction (restrictive). Elevated left atrial pressure.  2. Right ventricular systolic function is mildly reduced. The right ventricular size is normal. There is severely elevated pulmonary artery systolic pressure. The estimated right ventricular systolic pressure is  72.5 mmHg.  3. The mitral valve is abnormal. Moderate mitral valve regurgitation.  4. Tricuspid valve regurgitation is moderate.  5. Pulmonic valve regurgitation is mild to moderate.  6. The aortic valve is tricuspid. Aortic valve regurgitation is mild. No aortic stenosis is present.  7. The inferior vena cava is dilated in size with <50% respiratory variability, suggesting right atrial pressure of 15 mmHg. FINDINGS  Left Ventricle: Left ventricular ejection fraction, by estimation, is 45 to 50%. The left ventricle has mildly decreased function. The left ventricle demonstrates global hypokinesis. The left ventricular internal cavity size was normal in size. There is  mild left ventricular hypertrophy. Left ventricular diastolic parameters are consistent with Grade III diastolic dysfunction (restrictive). Elevated left atrial pressure. Right Ventricle: The right ventricular size is normal. Right vetricular wall thickness was not assessed. Right ventricular systolic function is mildly reduced. There is severely elevated pulmonary artery systolic pressure.  The tricuspid regurgitant velocity is 3.79 m/s, and with an assumed right atrial pressure of 15 mmHg, the estimated right ventricular systolic pressure is 24.4 mmHg. Left Atrium: Left atrial size was normal in size. Right Atrium: Right atrial size was normal in size. Pericardium: Trivial pericardial effusion is present. Mitral Valve: The mitral valve is abnormal. Moderate mitral valve regurgitation. Tricuspid Valve: The tricuspid valve is normal in structure. Tricuspid valve regurgitation is moderate. Aortic Valve: The aortic valve is tricuspid. Aortic valve regurgitation is mild. Aortic regurgitation PHT measures 543 msec. No aortic stenosis is present. Pulmonic Valve: The pulmonic valve was not well visualized. Pulmonic valve regurgitation is mild to moderate. Aorta: The aortic root and ascending aorta are structurally normal, with no evidence of dilitation. Venous: The inferior vena cava is dilated in size with less than 50% respiratory variability, suggesting right atrial pressure of 15 mmHg. IAS/Shunts: No atrial level shunt detected by color flow Doppler.  LEFT VENTRICLE PLAX 2D LVIDd:         4.30 cm     Diastology LVIDs:         3.60 cm     LV e' medial:    4.22 cm/s LV PW:         1.20 cm     LV E/e' medial:  18.8 LV IVS:        1.20 cm     LV e' lateral:   3.83 cm/s LVOT diam:     2.00 cm     LV E/e' lateral: 20.7 LV SV:         48 LV SV Index:   28 LVOT Area:     3.14 cm  LV Volumes (MOD) LV vol d, MOD A2C: 79.2 ml LV vol d, MOD A4C: 61.2 ml LV vol s, MOD A2C: 44.8 ml LV vol s, MOD A4C: 35.6 ml LV SV MOD A2C:     34.4 ml LV SV MOD A4C:     61.2 ml LV SV MOD BP:      33.0 ml RIGHT VENTRICLE            IVC RV S prime:     7.95 cm/s  IVC diam: 2.50 cm TAPSE (M-mode): 1.3 cm LEFT ATRIUM             Index       RIGHT ATRIUM           Index LA diam:        3.70 cm 2.12 cm/m  RA Area:     14.10 cm LA Vol (  A2C):   50.7 ml 29.07 ml/m RA Volume:   35.80 ml  20.52 ml/m LA Vol (A4C):   31.1 ml 17.83 ml/m LA  Biplane Vol: 42.0 ml 24.08 ml/m  AORTIC VALVE             PULMONIC VALVE LVOT Vmax:   72.70 cm/s  PR End Diast Vel: 2.15 msec LVOT Vmean:  53.700 cm/s LVOT VTI:    0.153 m AI PHT:      543 msec  AORTA Ao Root diam: 3.40 cm Ao Asc diam:  3.60 cm MITRAL VALVE                 TRICUSPID VALVE MV Area (PHT): 3.39 cm      TR Peak grad:   57.5 mmHg MV Decel Time: 224 msec      TR Vmax:        379.00 cm/s MR Peak grad:    92.5 mmHg MR Mean grad:    64.0 mmHg   SHUNTS MR Vmax:         481.00 cm/s Systemic VTI:  0.15 m MR Vmean:        388.0 cm/s  Systemic Diam: 2.00 cm MR PISA:         1.27 cm MR PISA Eff ROA: 11 mm MR PISA Radius:  0.45 cm MV E velocity: 79.40 cm/s Oswaldo Milian MD Electronically signed by Oswaldo Milian MD Signature Date/Time: 12/11/2019/1:05:27 PM    Final     Cardiac Studies   Echo:   1. Left ventricular ejection fraction, by estimation, is 45 to 50%. The  left ventricle has mildly decreased function. The left ventricle  demonstrates global hypokinesis. There is mild left ventricular  hypertrophy. Left ventricular diastolic parameters  are consistent with Grade III diastolic dysfunction (restrictive).  Elevated left atrial pressure.  2. Right ventricular systolic function is mildly reduced. The right  ventricular size is normal. There is severely elevated pulmonary artery  systolic pressure. The estimated right ventricular systolic pressure is  32.4 mmHg.  3. The mitral valve is abnormal. Moderate mitral valve regurgitation.  4. Tricuspid valve regurgitation is moderate.  5. Pulmonic valve regurgitation is mild to moderate.  6. The aortic valve is tricuspid. Aortic valve regurgitation is mild. No  aortic stenosis is present.  7. The inferior vena cava is dilated in size with <50% respiratory  variability, suggesting right atrial pressure of 15 mmHg.   Patient Profile     79 y.o. female with a hx of atrial fibrillation with RVR, HTN, and seizure  disorderwho is being seen today for the evaluation ofatrial fibrillation with RVR  Assessment & Plan    PAF with RVR:   Converted to NSR.  Transitioned to PO amiodarone.  On Eliquis and beta blocker.   I will start ARB (previously on low dose ACE.)  She has not presented in the past for med titration so we are just beginning this journey.     ACUTE ON CHRONIC SYSTOLIC AND DIASTOLIC HF:  Question intake and output not accurate.  Net negative 300 cc yesterday.  EF is mildly reduced as above. I would suggest Lasix 40 mg every day at discharge.    For questions or updates, please contact [place] Please consult [email or web address] for contact info under Cardiology/STEMI.   Signed, Minus Breeding, MD  12/12/2019, 7:50 AM

## 2019-12-12 NOTE — Discharge Summary (Addendum)
Physician Discharge Summary   Maureen [name] EVO:[id number] DOB: Oct 22, 1940 DOA: 12/10/2019  PCP: Mayra Neer, MD  Admit date: 12/10/2019 Discharge date: 12/12/2019  Admitted From: home Disposition:  home Discharging physician: Dwyane Dee, MD  Recommendations for Outpatient Follow-up:  1. Adjust BP meds as needed (Toprol, ARB) 2. Started on amiodarone during hospitalization   Patient discharged to home in Discharge Condition: stable CODE STATUS: Full3 Diet recommendation:  Diet Orders (From admission, onward)    Start     Ordered   12/12/19 0000  Diet - low sodium heart healthy        12/12/19 1300   12/10/19 1440  Diet Heart Room service appropriate? Yes; Fluid consistency: Thin; Fluid restriction: 1800 mL Fluid  Diet effective now       Question Answer Comment  Room service appropriate? Yes   Fluid consistency: Thin   Fluid restriction: 1800 mL Fluid      12/10/19 1440          Hospital Course: Maureen [name] is a 79 y.o. female with medical history significant of status post cardioversion, nonischemic cardiomyopathy with LVEF 35 to 40% February 2020 on Lasix, thyroid nodules, hypertension, seizure disorder, OSA noncompliant with CPAP who presented with worsening of shortness of breath and palpitations.   Patient reported her A. fib has not been well controlled recently, as for 2-3 weeks she has experienced frequent palpitations.  She did not seek any cardiology consultation (most recent cardiology visit was last year ) or increased her beta-blocker.  About 1 week ago, she started to feel increasing shortness of breath and wheezing.   On admission she had developed chest pain radiating to her shoulder and wheezing/shortness of breath.  On work-up she was found to be in A. fib with RVR.  She was treated with beta-blocker, fluids, and ultimately required amiodarone drip.  CTA was performed which was negative for PE but showed bilateral pleural effusions. She was  evaluated by cardiology during hospitalization.  At discharge she was recommended to continue on oral amiodarone. Her Toprol dose was decreased to 50 mg daily, this may need to be further adjusted at follow-up as well. Per cardiology, her lisinopril was also discontinued and she was started on losartan instead. She is also continued on Lasix 40 mg daily at discharge.  This was previously as needed at home prior to admission. She was instructed to keep a weight log daily and bring this with her to follow-up.  Acute symptomatic A. fib with RVR -Has history of refractory A. fib status post cardioversion x1 last year.  -Amiodarone drip was started in the ED- cardiology following -Continue Eliquis -Thyroid panel within normal limits - amiodarone 200 mg BID x 2 weeks then 200 mg daily at discharge  Acute on chronic combined systolic and diastolic CHF - Moderate size right pleural effusion and small left pleural effusion with associated bibasilar atelectasis -continue diuresis per cardiology -Chronic systolic failure likely secondary to nonischemic cardiomyopathy as patient had a normal left-sided cardiac cath last year. - continue lasix 40 mg daily at d/c; keep weight log  HTN -continue on new ARB (ACEi d/c per cardiology) - continue Toprol; decreased to 50 mg daily; may need to adjust at followup  Seizure disorder -Keppra   The patient's chronic medical conditions were treated accordingly per the patient's home medication regimen except as noted.  On day of discharge, patient was felt deemed stable for discharge. Patient/family member advised to call PCP or come back to  ER if needed.   Principal Diagnosis: A-fib [facility])  Discharge Diagnoses: Active Hospital Problems   Diagnosis Date Noted  . A-fib ([place]) 02/17/2018  . Acute on chronic combined systolic and diastolic CHF (congestive heart failure) ([place]) 12/10/2019    Resolved Hospital Problems  No resolved problems to display.     Discharge Instructions    Diet - low sodium heart healthy   Complete by: As directed    Increase activity slowly   Complete by: As directed      Allergies as of 12/12/2019      Reactions   Dilantin [phenytoin Sodium Extended]     Stupor, ataxia.    Hydrocodone Nausea And Vomiting      Medication List    STOP taking these medications   lisinopril 2.5 MG tablet Commonly known as: ZESTRIL     TAKE these medications   amiodarone 200 MG tablet Commonly known as: PACERONE Take 1 tablet (200 mg total) by mouth 2 (two) times daily for 14 days.   amiodarone 200 MG tablet Commonly known as: PACERONE Take 1 tablet (200 mg total) by mouth daily. Start taking on: December 27, 2019   calcium-vitamin D 500-200 MG-UNIT tablet Commonly known as: OSCAL WITH D Take 1 tablet by mouth 2 (two) times daily.   Eliquis 5 MG Tabs tablet Generic drug: apixaban TAKE 1 TABLET BY MOUTH TWICE DAILY TO REPLACE WARFARIN What changed: See the new instructions.   furosemide 40 MG tablet Commonly known as: LASIX Take 1 tablet (40 mg total) by mouth daily. What changed:   how much to take  how to take this  when to take this  additional instructions   levETIRAcetam 500 MG tablet Commonly known as: KEPPRA Take 1 tablet (500 mg total) by mouth 2 (two) times daily.   losartan 25 MG tablet Commonly known as: COZAAR Take 1 tablet (25 mg total) by mouth daily. Start taking on: December 13, 2019   metoprolol succinate 50 MG 24 hr tablet Commonly known as: TOPROL-XL Take 1 tablet (50 mg total) by mouth daily. What changed:   medication strength  how much to take  additional instructions   multivitamin with minerals Tabs tablet Take 1 tablet by mouth daily.   pravastatin 40 MG tablet Commonly known as: PRAVACHOL Take 40 mg by mouth daily.       Follow-up Information    Mayra Neer, MD.   Specialty: Family Medicine Why: The office will call patient Contact  information: [place] [place] [address] [phone]              Allergies  Allergen Reactions  . Dilantin [Phenytoin Sodium Extended]      Stupor, ataxia.   Marland Kitchen Hydrocodone Nausea And Vomiting    Consultations: Cardiology  Discharge Exam: BP 132/75 (BP Location: Right Arm)   Pulse 63   Temp 98.9 F (37.2 C) (Oral)   Resp 18   Ht 5\' 2"  (1.575 m)   Wt 71.7 kg   SpO2 96%   BMI 28.90 kg/m  General appearance: alert, cooperative and no distress Head: Normocephalic, without obvious abnormality, atraumatic Eyes: EOMI Lungs: clear to auscultation bilaterally Heart: regular rate and rhythm and S1, S2 normal Abdomen: normal findings: bowel sounds normal and soft, non-tender Extremities: trace LE edema Skin: mobility and turgor normal Neurologic: Grossly normal  The results of significant diagnostics from this hospitalization (including imaging, microbiology, ancillary and laboratory) are listed below for reference.   Microbiology:  Recent Results (from the past 240 hour(s))  Respiratory Panel by RT PCR (Flu A&B, Covid) - Nasopharyngeal Swab     Status: None   Collection Time: 12/10/19  9:16 AM   Specimen: Nasopharyngeal Swab; Nasopharyngeal(NP) swabs in vial transport medium  Result Value Ref Range Status   SARS Coronavirus 2 by RT PCR NEGATIVE NEGATIVE Final    Comment: (NOTE) SARS-CoV-2 target nucleic acids are NOT DETECTED.  The SARS-CoV-2 RNA is generally detectable in upper respiratoy specimens during the acute phase of infection. The lowest concentration of SARS-CoV-2 viral copies this assay can detect is 131 copies/mL. A negative result does not preclude SARS-Cov-2 infection and should not be used as the sole basis for treatment or other patient management decisions. A negative result may occur with  improper specimen collection/handling, submission of specimen other than nasopharyngeal swab, presence of viral mutation(s) within  the areas targeted by this assay, and inadequate number of viral copies (<131 copies/mL). A negative result must be combined with clinical observations, patient history, and epidemiological information. The expected result is Negative.  Fact Sheet for Patients:  PinkCheek.be  Fact Sheet for Healthcare Providers:  GravelBags.it  This test is no t yet approved or cleared by the [place] FDA and  has been authorized for detection and/or diagnosis of SARS-CoV-2 by FDA under an Emergency Use Authorization (EUA). This EUA will remain  in effect (meaning this test can be used) for the duration of the COVID-19 declaration under Section 564(b)(1) of the Act, 21 U.S.C. section 360bbb-3(b)(1), unless the authorization is terminated or revoked sooner.     Influenza A by PCR NEGATIVE NEGATIVE Final   Influenza B by PCR NEGATIVE NEGATIVE Final    Comment: (NOTE) The Xpert Xpress SARS-CoV-2/FLU/RSV assay is intended as an aid in  the diagnosis of influenza from Nasopharyngeal swab specimens and  should not be used as a sole basis for treatment. Nasal washings and  aspirates are unacceptable for Xpert Xpress SARS-CoV-2/FLU/RSV  testing.  Fact Sheet for Patients: PinkCheek.be  Fact Sheet for Healthcare Providers: GravelBags.it  This test is not yet approved or cleared by the [place] FDA and  has been authorized for detection and/or diagnosis of SARS-CoV-2 by  FDA under an Emergency Use Authorization (EUA). This EUA will remain  in effect (meaning this test can be used) for the duration of the  Covid-19 declaration under Section 564(b)(1) of the Act, 21  U.S.C. section 360bbb-3(b)(1), unless the authorization is  terminated or revoked. Performed at [facility], [place] [address] [place], [place] [address]      Labs: BNP (last 3 results) No results for  input(s): BNP in the last 8760 hours. Basic Metabolic Panel: Recent Labs  Lab 12/10/19 0921 12/11/19 0818 12/12/19 0454  NA 136 136 138  K 4.9 4.2 3.5  CL 103 105 102  CO2 21* 21* 26  GLUCOSE 166* 133* 137*  BUN 48* 48* 47*  CREATININE 1.34* 1.33* 1.36*  CALCIUM 9.4 9.1 9.3  MG 2.1 2.0  --    Liver Function Tests: No results for input(s): AST, ALT, ALKPHOS, BILITOT, PROT, ALBUMIN in the last 168 hours. No results for input(s): LIPASE, AMYLASE in the last 168 hours. No results for input(s): AMMONIA in the last 168 hours. CBC: Recent Labs  Lab 12/10/19 0921 12/12/19 0454  WBC 12.1* 8.3  HGB 13.2 11.4*  HCT 42.4 35.5*  MCV 90.0 87.7  PLT 410* 266   Cardiac Enzymes: No results for input(s):  CKTOTAL, CKMB, CKMBINDEX, TROPONINI in the last 168 hours. BNP: Invalid input(s): POCBNP CBG: No results for input(s): GLUCAP in the last 168 hours. D-Dimer Recent Labs    12/10/19 0921  DDIMER 1.60*   Hgb A1c No results for input(s): HGBA1C in the last 72 hours. Lipid Profile No results for input(s): CHOL, HDL, LDLCALC, TRIG, CHOLHDL, LDLDIRECT in the last 72 hours. Thyroid function studies Recent Labs    12/10/19 1800 12/10/19 1801  TSH 1.573  --   T3FREE  --  1.8*   Anemia work up No results for input(s): VITAMINB12, FOLATE, FERRITIN, TIBC, IRON, RETICCTPCT in the last 72 hours. Urinalysis No results found for: COLORURINE, APPEARANCEUR, [place], [place], [place], [place], [place], [place], PROTEINUR, UROBILINOGEN, NITRITE, LEUKOCYTESUR Sepsis Labs Invalid input(s): PROCALCITONIN,  WBC,  LACTICIDVEN Microbiology Recent Results (from the past 240 hour(s))  Respiratory Panel by RT PCR (Flu A&B, Covid) - Nasopharyngeal Swab     Status: None   Collection Time: 12/10/19  9:16 AM   Specimen: Nasopharyngeal Swab; Nasopharyngeal(NP) swabs in vial transport medium  Result Value Ref Range Status   SARS Coronavirus 2 by RT PCR NEGATIVE NEGATIVE Final    Comment:  (NOTE) SARS-CoV-2 target nucleic acids are NOT DETECTED.  The SARS-CoV-2 RNA is generally detectable in upper respiratoy specimens during the acute phase of infection. The lowest concentration of SARS-CoV-2 viral copies this assay can detect is 131 copies/mL. A negative result does not preclude SARS-Cov-2 infection and should not be used as the sole basis for treatment or other patient management decisions. A negative result may occur with  improper specimen collection/handling, submission of specimen other than nasopharyngeal swab, presence of viral mutation(s) within the areas targeted by this assay, and inadequate number of viral copies (<131 copies/mL). A negative result must be combined with clinical observations, patient history, and epidemiological information. The expected result is Negative.  Fact Sheet for Patients:  PinkCheek.be  Fact Sheet for Healthcare Providers:  GravelBags.it  This test is no t yet approved or cleared by the [place] FDA and  has been authorized for detection and/or diagnosis of SARS-CoV-2 by FDA under an Emergency Use Authorization (EUA). This EUA will remain  in effect (meaning this test can be used) for the duration of the COVID-19 declaration under Section 564(b)(1) of the Act, 21 U.S.C. section 360bbb-3(b)(1), unless the authorization is terminated or revoked sooner.     Influenza A by PCR NEGATIVE NEGATIVE Final   Influenza B by PCR NEGATIVE NEGATIVE Final    Comment: (NOTE) The Xpert Xpress SARS-CoV-2/FLU/RSV assay is intended as an aid in  the diagnosis of influenza from Nasopharyngeal swab specimens and  should not be used as a sole basis for treatment. Nasal washings and  aspirates are unacceptable for Xpert Xpress SARS-CoV-2/FLU/RSV  testing.  Fact Sheet for Patients: PinkCheek.be  Fact Sheet for Healthcare  Providers: GravelBags.it  This test is not yet approved or cleared by the [place] FDA and  has been authorized for detection and/or diagnosis of SARS-CoV-2 by  FDA under an Emergency Use Authorization (EUA). This EUA will remain  in effect (meaning this test can be used) for the duration of the  Covid-19 declaration under Section 564(b)(1) of the Act, 21  U.S.C. section 360bbb-3(b)(1), unless the authorization is  terminated or revoked. Performed at [facility], [place] [address] [place], [place] [address]     Procedures/Studies: CT Angio Chest PE W and/or Wo Contrast  Result Date: 12/10/2019 CLINICAL DATA:  Shortness  of breath with elevated D-dimer. Fall 1 week ago with left-sided pain. EXAM: CT ANGIOGRAPHY CHEST CT ABDOMEN AND PELVIS WITH CONTRAST TECHNIQUE: Multidetector CT imaging of the chest was performed using the standard protocol during bolus administration of intravenous contrast. Multiplanar CT image reconstructions and MIPs were obtained to evaluate the vascular anatomy. Multidetector CT imaging of the abdomen and pelvis was performed using the standard protocol during bolus administration of intravenous contrast. CONTRAST:  59mL OMNIPAQUE IOHEXOL 350 MG/ML SOLN COMPARISON:  CT chest 11/18/2017 and CT abdomen 09/23/2013 FINDINGS: CTA CHEST FINDINGS Cardiovascular: Heart is normal size. Mild calcified plaque over the left anterior descending coronary artery. Minimal calcified plaque over the thoracic aorta. Pulmonary arterial system is adequately opacified and demonstrates no evidence of emboli. Mediastinum/Nodes: No evidence of mediastinal or hilar adenopathy. Remaining mediastinal structures are normal. Lungs/Pleura: Lungs are adequately inflated demonstrate a moderate size right pleural effusion and small left pleural effusion with minimal associated bibasilar dependent atelectasis. Airways are unremarkable. Musculoskeletal: No focal  abnormality. Degenerative changes of the spine. Review of the MIP images confirms the above findings. CT ABDOMEN and PELVIS FINDINGS Hepatobiliary: Known hemangioma slightly smaller over the right lobe of the liver. Previous cholecystectomy. Intrahepatic biliary tree is normal. Stable post cholecystectomy prominence of the common bile duct. Pancreas: Normal. Spleen: Normal. Adrenals/Urinary Tract: Adrenal glands are normal. Kidneys are normal size without hydronephrosis or nephrolithiasis. Subcentimeter hypodensity over the lower pole cortex of the left kidney too small to characterize but likely a cyst and unchanged. Ureters and bladder are normal. Stomach/Bowel: Stomach and small bowel are normal. Moderate diverticulosis of the colon specially over the sigmoid colon. Appendix not visualized. Vascular/Lymphatic: Mild calcified plaque over the abdominal aorta without evidence of aneurysm. No adenopathy. Reproductive: Previous hysterectomy. Other: Mild-to-moderate amount of free fluid in the pelvis and inferior pericolic gutters. Musculoskeletal: No focal abnormality. Degenerative change of the spine with mild multilevel disc disease over the lumbar spine. Review of the MIP images confirms the above findings. IMPRESSION: 1. No evidence of pulmonary embolism. 2. Moderate size right pleural effusion and small left pleural effusion with associated bibasilar atelectasis. 3. No acute findings in the abdomen/pelvis. Mild-to-moderate amount of free fluid in the pelvis and inferior pericolic gutters. 4. Colonic diverticulosis. 5. Subcentimeter left renal cortical hypodensity too small to characterize, but likely a cyst and unchanged. 6. Aortic atherosclerosis. Minimal atherosclerotic coronary artery disease. 7. Known right liver hemangioma slightly smaller. Aortic Atherosclerosis (ICD10-I70.0). Electronically Signed   By: Marin Olp M.D.   On: 12/10/2019 13:12   CT ABDOMEN PELVIS W CONTRAST  Result Date:  12/10/2019 CLINICAL DATA:  Shortness of breath with elevated D-dimer. Fall 1 week ago with left-sided pain. EXAM: CT ANGIOGRAPHY CHEST CT ABDOMEN AND PELVIS WITH CONTRAST TECHNIQUE: Multidetector CT imaging of the chest was performed using the standard protocol during bolus administration of intravenous contrast. Multiplanar CT image reconstructions and MIPs were obtained to evaluate the vascular anatomy. Multidetector CT imaging of the abdomen and pelvis was performed using the standard protocol during bolus administration of intravenous contrast. CONTRAST:  59mL OMNIPAQUE IOHEXOL 350 MG/ML SOLN COMPARISON:  CT chest 11/18/2017 and CT abdomen 09/23/2013 FINDINGS: CTA CHEST FINDINGS Cardiovascular: Heart is normal size. Mild calcified plaque over the left anterior descending coronary artery. Minimal calcified plaque over the thoracic aorta. Pulmonary arterial system is adequately opacified and demonstrates no evidence of emboli. Mediastinum/Nodes: No evidence of mediastinal or hilar adenopathy. Remaining mediastinal structures are normal. Lungs/Pleura: Lungs are adequately inflated demonstrate a moderate size  right pleural effusion and small left pleural effusion with minimal associated bibasilar dependent atelectasis. Airways are unremarkable. Musculoskeletal: No focal abnormality. Degenerative changes of the spine. Review of the MIP images confirms the above findings. CT ABDOMEN and PELVIS FINDINGS Hepatobiliary: Known hemangioma slightly smaller over the right lobe of the liver. Previous cholecystectomy. Intrahepatic biliary tree is normal. Stable post cholecystectomy prominence of the common bile duct. Pancreas: Normal. Spleen: Normal. Adrenals/Urinary Tract: Adrenal glands are normal. Kidneys are normal size without hydronephrosis or nephrolithiasis. Subcentimeter hypodensity over the lower pole cortex of the left kidney too small to characterize but likely a cyst and unchanged. Ureters and bladder are normal.  Stomach/Bowel: Stomach and small bowel are normal. Moderate diverticulosis of the colon specially over the sigmoid colon. Appendix not visualized. Vascular/Lymphatic: Mild calcified plaque over the abdominal aorta without evidence of aneurysm. No adenopathy. Reproductive: Previous hysterectomy. Other: Mild-to-moderate amount of free fluid in the pelvis and inferior pericolic gutters. Musculoskeletal: No focal abnormality. Degenerative change of the spine with mild multilevel disc disease over the lumbar spine. Review of the MIP images confirms the above findings. IMPRESSION: 1. No evidence of pulmonary embolism. 2. Moderate size right pleural effusion and small left pleural effusion with associated bibasilar atelectasis. 3. No acute findings in the abdomen/pelvis. Mild-to-moderate amount of free fluid in the pelvis and inferior pericolic gutters. 4. Colonic diverticulosis. 5. Subcentimeter left renal cortical hypodensity too small to characterize, but likely a cyst and unchanged. 6. Aortic atherosclerosis. Minimal atherosclerotic coronary artery disease. 7. Known right liver hemangioma slightly smaller. Aortic Atherosclerosis (ICD10-I70.0). Electronically Signed   By: Marin Olp M.D.   On: 12/10/2019 13:12   ECHOCARDIOGRAM COMPLETE  Result Date: 12/11/2019    ECHOCARDIOGRAM REPORT   Patient Name:   ANNSLEIGH DRAGOO Date of Exam: 12/11/2019 Medical Rec #:  [id number]      Height:       62.0 in Accession #:    [id number]     Weight:       161.2 lb Date of Birth:  06/02/1940      BSA:          1.744 m Patient Age:    64 years       BP:           134/76 mmHg Patient Gender: F              HR:           54 bpm. Exam Location:  Inpatient Procedure: 2D Echo, Cardiac Doppler and Color Doppler Indications:    I50.40* Unspecified combined systolic (congestive) and diastolic                 (congestive) heart failure  History:        Patient has prior history of Echocardiogram examinations, most                 recent  02/24/2018. CHF and Cardiomyopathy, Abnormal ECG,                 Arrythmias:Atrial Fibrillation; Signs/Symptoms:Dyspnea and                 Shortness of Breath.  Sonographer:    Roseanna Rainbow RDCS Referring Phys: [id number] [place] Comments: Technically difficult study due to poor echo windows. Image acquisition challenging due to patient body habitus. IMPRESSIONS  1. Left ventricular ejection fraction, by estimation, is 45 to 50%. The left ventricle has mildly decreased function. The left  ventricle demonstrates global hypokinesis. There is mild left ventricular hypertrophy. Left ventricular diastolic parameters are consistent with Grade III diastolic dysfunction (restrictive). Elevated left atrial pressure.  2. Right ventricular systolic function is mildly reduced. The right ventricular size is normal. There is severely elevated pulmonary artery systolic pressure. The estimated right ventricular systolic pressure is 19.3 mmHg.  3. The mitral valve is abnormal. Moderate mitral valve regurgitation.  4. Tricuspid valve regurgitation is moderate.  5. Pulmonic valve regurgitation is mild to moderate.  6. The aortic valve is tricuspid. Aortic valve regurgitation is mild. No aortic stenosis is present.  7. The inferior vena cava is dilated in size with <50% respiratory variability, suggesting right atrial pressure of 15 mmHg. FINDINGS  Left Ventricle: Left ventricular ejection fraction, by estimation, is 45 to 50%. The left ventricle has mildly decreased function. The left ventricle demonstrates global hypokinesis. The left ventricular internal cavity size was normal in size. There is  mild left ventricular hypertrophy. Left ventricular diastolic parameters are consistent with Grade III diastolic dysfunction (restrictive). Elevated left atrial pressure. Right Ventricle: The right ventricular size is normal. Right vetricular wall thickness was not assessed. Right ventricular systolic function is mildly reduced.  There is severely elevated pulmonary artery systolic pressure. The tricuspid regurgitant velocity is 3.79 m/s, and with an assumed right atrial pressure of 15 mmHg, the estimated right ventricular systolic pressure is 79.0 mmHg. Left Atrium: Left atrial size was normal in size. Right Atrium: Right atrial size was normal in size. Pericardium: Trivial pericardial effusion is present. Mitral Valve: The mitral valve is abnormal. Moderate mitral valve regurgitation. Tricuspid Valve: The tricuspid valve is normal in structure. Tricuspid valve regurgitation is moderate. Aortic Valve: The aortic valve is tricuspid. Aortic valve regurgitation is mild. Aortic regurgitation PHT measures 543 msec. No aortic stenosis is present. Pulmonic Valve: The pulmonic valve was not well visualized. Pulmonic valve regurgitation is mild to moderate. Aorta: The aortic root and ascending aorta are structurally normal, with no evidence of dilitation. Venous: The inferior vena cava is dilated in size with less than 50% respiratory variability, suggesting right atrial pressure of 15 mmHg. IAS/Shunts: No atrial level shunt detected by color flow Doppler.  LEFT VENTRICLE PLAX 2D LVIDd:         4.30 cm     Diastology LVIDs:         3.60 cm     LV e' medial:    4.22 cm/s LV PW:         1.20 cm     LV E/e' medial:  18.8 LV IVS:        1.20 cm     LV e' lateral:   3.83 cm/s LVOT diam:     2.00 cm     LV E/e' lateral: 20.7 LV SV:         48 LV SV Index:   28 LVOT Area:     3.14 cm  LV Volumes (MOD) LV vol d, MOD A2C: 79.2 ml LV vol d, MOD A4C: 61.2 ml LV vol s, MOD A2C: 44.8 ml LV vol s, MOD A4C: 35.6 ml LV SV MOD A2C:     34.4 ml LV SV MOD A4C:     61.2 ml LV SV MOD BP:      33.0 ml RIGHT VENTRICLE            IVC RV S prime:     7.95 cm/s  IVC diam: 2.50 cm TAPSE (M-mode): 1.3 cm LEFT ATRIUM  Index       RIGHT ATRIUM           Index LA diam:        3.70 cm 2.12 cm/m  RA Area:     14.10 cm LA Vol (A2C):   50.7 ml 29.07 ml/m RA Volume:    35.80 ml  20.52 ml/m LA Vol (A4C):   31.1 ml 17.83 ml/m LA Biplane Vol: 42.0 ml 24.08 ml/m  AORTIC VALVE             PULMONIC VALVE LVOT Vmax:   72.70 cm/s  PR End Diast Vel: 2.15 msec LVOT Vmean:  53.700 cm/s LVOT VTI:    0.153 m AI PHT:      543 msec  AORTA Ao Root diam: 3.40 cm Ao Asc diam:  3.60 cm MITRAL VALVE                 TRICUSPID VALVE MV Area (PHT): 3.39 cm      TR Peak grad:   57.5 mmHg MV Decel Time: 224 msec      TR Vmax:        379.00 cm/s MR Peak grad:    92.5 mmHg MR Mean grad:    64.0 mmHg   SHUNTS MR Vmax:         481.00 cm/s Systemic VTI:  0.15 m MR Vmean:        388.0 cm/s  Systemic Diam: 2.00 cm MR PISA:         1.27 cm MR PISA Eff ROA: 11 mm MR PISA Radius:  0.45 cm MV E velocity: 79.40 cm/s Oswaldo Milian MD Electronically signed by Oswaldo Milian MD Signature Date/Time: 12/11/2019/1:05:27 PM    Final      Time coordinating discharge: Over 30 minutes    Dwyane Dee, MD  [facility] 12/12/2019, 5:39 PM

## 2019-12-21 ENCOUNTER — Encounter: Payer: Self-pay | Admitting: Nurse Practitioner

## 2019-12-21 ENCOUNTER — Other Ambulatory Visit: Payer: Self-pay

## 2019-12-21 ENCOUNTER — Ambulatory Visit: Payer: Medicare PPO | Admitting: Nurse Practitioner

## 2019-12-21 VITALS — BP 120/80 | HR 48 | Ht 61.0 in | Wt 149.8 lb

## 2019-12-21 DIAGNOSIS — I10 Essential (primary) hypertension: Secondary | ICD-10-CM | POA: Diagnosis not present

## 2019-12-21 DIAGNOSIS — I428 Other cardiomyopathies: Secondary | ICD-10-CM

## 2019-12-21 DIAGNOSIS — Z7901 Long term (current) use of anticoagulants: Secondary | ICD-10-CM

## 2019-12-21 DIAGNOSIS — I5042 Chronic combined systolic (congestive) and diastolic (congestive) heart failure: Secondary | ICD-10-CM | POA: Diagnosis not present

## 2019-12-21 DIAGNOSIS — I480 Paroxysmal atrial fibrillation: Secondary | ICD-10-CM | POA: Diagnosis not present

## 2019-12-21 LAB — HEPATIC FUNCTION PANEL
ALT: 29 IU/L (ref 0–32)
AST: 22 IU/L (ref 0–40)
Albumin: 4.8 g/dL — ABNORMAL HIGH (ref 3.6–4.6)
Alkaline Phosphatase: 76 IU/L (ref 44–121)
Bilirubin Total: 0.7 mg/dL (ref 0.0–1.2)
Bilirubin, Direct: 0.19 mg/dL (ref 0.00–0.40)
Total Protein: 6.7 g/dL (ref 6.0–8.5)

## 2019-12-21 LAB — BASIC METABOLIC PANEL
BUN/Creatinine Ratio: 26 (ref 12–28)
BUN: 26 mg/dL (ref 8–27)
CO2: 32 mmol/L — ABNORMAL HIGH (ref 20–29)
Calcium: 10.4 mg/dL — ABNORMAL HIGH (ref 8.7–10.3)
Chloride: 99 mmol/L (ref 96–106)
Creatinine, Ser: 1 mg/dL (ref 0.57–1.00)
GFR calc Af Amer: 61 mL/min/{1.73_m2} (ref >59)
GFR calc non Af Amer: 53 mL/min/{1.73_m2} — ABNORMAL LOW (ref >59)
Glucose: 83 mg/dL (ref 65–99)
Potassium: 4.6 mmol/L (ref 3.5–5.2)
Sodium: 142 mmol/L (ref 134–144)

## 2019-12-21 LAB — CBC
Hematocrit: 41.9 % (ref 34.0–46.6)
Hemoglobin: 14.3 g/dL (ref 11.1–15.9)
MCH: 28.2 pg (ref 26.6–33.0)
MCHC: 34.1 g/dL (ref 31.5–35.7)
MCV: 83 fL (ref 79–97)
Platelets: 322 10*3/uL (ref 150–450)
RBC: 5.07 x10E6/uL (ref 3.77–5.28)
RDW: 13.2 % (ref 11.7–15.4)
WBC: 6.9 10*3/uL (ref 3.4–10.8)

## 2019-12-21 MED ORDER — METOPROLOL SUCCINATE ER 25 MG PO TB24: 25.0000 mg | ORAL_TABLET | Freq: Every day | ORAL | 3 refills | Status: DC

## 2019-12-21 NOTE — Patient Instructions (Addendum)
After Visit Summary:  We will be checking the following labs today - BMET, CBC and HPF   Medication Instructions:    Continue with your current medicines. BUT  I am changing your Metoprolol to 25 mg a day - I have sent this to your pharmacy   If you need a refill on your cardiac medications before your next appointment, please call your pharmacy.     Testing/Procedures To Be Arranged:  N/A  Follow-Up:   See Dr. Ellyn Hack in about a month with an EKG    At [facility], you and your health needs are our priority.  As part of our continuing mission to provide you with exceptional heart care, we have created designated Provider Care Teams.  These Care Teams include your primary Cardiologist (physician) and Advanced Practice Providers (APPs -  Physician Assistants and Nurse Practitioners) who all work together to provide you with the care you need, when you need it.  Special Instructions:  . Stay safe, wash your hands for at least 20 seconds and wear a mask when needed.  . It was good to talk with you today.    Call the [place] office at [phone] if you have any questions, problems or concerns.

## 2019-12-22 NOTE — Progress Notes (Signed)
Pt has been made aware of normal result and verbalized understanding.  jw

## 2019-12-30 ENCOUNTER — Other Ambulatory Visit: Payer: Self-pay

## 2019-12-30 MED ORDER — AMIODARONE HCL 200 MG PO TABS
200.0000 mg | ORAL_TABLET | Freq: Every day | ORAL | 6 refills | Status: DC
Start: 2019-12-30 — End: 2023-03-04

## 2019-12-30 NOTE — Telephone Encounter (Signed)
This is Dr. Harding's pt. °

## 2020-01-11 ENCOUNTER — Other Ambulatory Visit: Payer: Self-pay | Admitting: Cardiology

## 2020-01-23 ENCOUNTER — Ambulatory Visit: Payer: Medicare PPO | Admitting: Cardiology

## 2020-02-03 ENCOUNTER — Encounter: Payer: Self-pay | Admitting: Cardiology

## 2020-02-03 ENCOUNTER — Telehealth: Payer: Self-pay | Admitting: *Deleted

## 2020-02-03 ENCOUNTER — Telehealth (INDEPENDENT_AMBULATORY_CARE_PROVIDER_SITE_OTHER): Payer: Medicare PPO | Admitting: Cardiology

## 2020-02-03 VITALS — BP 163/88 | HR 63 | Ht 61.0 in

## 2020-02-03 DIAGNOSIS — I428 Other cardiomyopathies: Secondary | ICD-10-CM | POA: Diagnosis not present

## 2020-02-03 DIAGNOSIS — Z5181 Encounter for therapeutic drug level monitoring: Secondary | ICD-10-CM | POA: Diagnosis not present

## 2020-02-03 DIAGNOSIS — I480 Paroxysmal atrial fibrillation: Secondary | ICD-10-CM | POA: Diagnosis not present

## 2020-02-03 DIAGNOSIS — I5042 Chronic combined systolic (congestive) and diastolic (congestive) heart failure: Secondary | ICD-10-CM

## 2020-02-03 DIAGNOSIS — Z7901 Long term (current) use of anticoagulants: Secondary | ICD-10-CM

## 2020-02-03 DIAGNOSIS — G4733 Obstructive sleep apnea (adult) (pediatric): Secondary | ICD-10-CM | POA: Diagnosis not present

## 2020-02-03 DIAGNOSIS — Z79899 Other long term (current) drug therapy: Secondary | ICD-10-CM | POA: Diagnosis not present

## 2020-02-03 DIAGNOSIS — I10 Essential (primary) hypertension: Secondary | ICD-10-CM | POA: Diagnosis not present

## 2020-02-03 MED ORDER — LOSARTAN POTASSIUM 50 MG PO TABS: 50.0000 mg | ORAL_TABLET | Freq: Every day | ORAL | 3 refills | Status: DC

## 2020-02-03 NOTE — Telephone Encounter (Signed)
RN spoke to patient. Instruction were given  from today's virtual visit 02/03/20 .  AVS SUMMARY has been sent by [facility] and mailed along with labslip.  Appointment schedule for April 25, 2020   Patient verbalized understanding

## 2020-02-03 NOTE — Progress Notes (Signed)
Virtual Visit via Video Note   This visit type was conducted due to national recommendations for restrictions regarding the COVID-19 Pandemic (e.g. social distancing) in an effort to limit this patient's exposure and mitigate transmission in our community.  Due to her co-morbid illnesses, this patient is at least at moderate risk for complications without adequate follow up.  This format is felt to be most appropriate for this patient at this time.  All issues noted in this document were discussed and addressed.  A limited physical exam was performed with this format.  Please refer to the patient's chart for her consent to telehealth for [facility].  Video Connection Lost Video connection was lost at > 50% of the duration of this visit, at which time the remainder of the visit was completed via audio only.  Patient was unable to complete video connection with initial attempt.  Evaluation Performed:  Follow-up visit  Date:  02/03/2020   ID:  Maureen [name], DOB [birth year], MRN [id number]  Patient Location: Home Provider Location: Office/Clinic  PCP:  Mayra Neer, MD  Cardiologist:  Glenetta Hew, MD  Electrophysiologist:  None   Chief Complaint:   Chief Complaint  Patient presents with  . Follow-up    1 month reassessment  . Atrial Fibrillation    Stable, remains on amiodarone.  No recurrence  . Hypertension    Blood pressures are running high since reducing dose of beta-blocker.    History of Present Illness:    Maureen [name] is a 80 y.o. female with PMH notable for PAF and HFrEF who presents via audio/video conferencing for a telehealth visit today as a 1 month follow-up.  Maureen [name] was last seen by me on May 03, 2018 via telemedicine.  I had recently seen here in late January early February for acute on chronic combined systolic and diastolic heart failure in the setting of A. fib RVR.  She felt like this was brought on by stress from taking care of her husband  with dementia. ->   --> Echocardiogram showed EF 35 to 40% with severe dilated left atrium.  This was found to be nonischemic cardiomyopathy with cardiac catheterization (R&LHC). --> She eventually had TEE cardioversion on February 5, and was treated with warfarin until she was able to be weaned fully off of Tegretol and converted to [place].  The plan was to convert to [place] after roughly 6 months of warfarin in order to assure that she is stable on Keppra and off of Tegretol. --> She was doing relatively well as this was her second visit post hospital -> she had just gotten back from a trip to the beach and maybe had a little more swelling than usual because of having probably more salt in her diet and drinking more fluids.  No orthopnea though.  No further rapid irregular heartbeats or palpitations.  Exertional dyspnea notably improved since being out of the hospital.  Energy better.  Plan was to convert from warfarin to Eliquis in August-September timeframe.  No medication changes otherwise.  ->  Plan was follow-up echo in June 2020 (not done)  Hospitalizations:  . 11/20-22/2021 : Admitted for A. fib RVR with heart failure.  She noted about 2 to 3 weeks of palpitations but did not seek cardiology evaluation.  1 week prior to presentation started noticing worsening dyspnea.  On admission she was noting chest pain radiating to her shoulder along with wheezing.-->  Was treated with beta-blocker, fluids and eventually amiodarone  drip.  CT scan showed bilateral pleural effusions.-->  Was continued on amiodarone following discharge and Toprol was reduced to 50 mg daily.Marland Kitchen  She was converted from lisinopril to losartan, and continued on 40 mg Lasix. o Cardiology was consulted.  She was seen in hospital follow-up by Truitt Merle, NP on 12/21/2019.  She felt as though she was progressing well.  Less dyspneic less dizzy.  Weight was back down.  She is due to cut back on her amiodarone to maintenance dosing.  No  concerns.  Declined use of CPAP.  Reiterated social stresses being caregiver for her husband.  Toprol reduced to 25 mg daily because of bradycardia.  Plan was 1 month follow-up with an EKG, however she was not able to make her original appointment and was rescheduled for telehealth.  Recent - Interim CV studies:    The following studies were reviewed today: . Echocardiogram 12/11/2019: EF 45 to 50%.  Mildly reduced function.  GR 3 DD with elevated LAP.  Estimated RVP/PAP 72 to 73 mmHg.  Moderate MR and TR.  Mild to moderate PR.  Suggested CVP 15 mmHg.  Interestingly, despite the severe elevated pressures.  Both right and left atria were both normal size.  Inerval History   Maureen [name] is doing relatively well overall from a cardiac standpoint.  She says that her energy level is definitely coming back, and she has not had any further episodes of atrial fibrillation-no more palpitations.  The main thing she is noting is that her blood pressure has been low but higher than it had been before.  She has had no recurrent heart failure symptoms or any symptoms to suggest recurrent atrial fibrillation.  She usually takes Lasix every day, but may miss a day if she is going out and about all day long & Edema is well controlled.  She says she has gained weight over Christmas because she has been eating more and has not been out walking as much.  When she does go out and walk with her husband, he walks very slowly so she feels as though she does not "get much exercise ".  She notes that she is not thinking is clearly and maybe having a little memory issues off and on.  She feels a little more tired than usual, but is otherwise doing okay.    Cardiovascular ROS: positive for - Maybe little more tired than usual, little exercise intolerance-partly because of deconditioning; she has become more sedentary.  Dyspnea but also related to some exertional dyspnea. negative for - chest pain, edema, irregular  heartbeat, orthopnea, palpitations, paroxysmal nocturnal dyspnea, rapid heart rate, shortness of breath or Syncope/near syncope or TIA/amaurosis fugax, claudication   ROS:  Please see the history of present illness.    The patient does not have symptoms concerning for COVID-19 infection (fever, chills, cough, or new shortness of breath).  Review of Systems  Constitutional: Positive for malaise/fatigue (Just a little bit). Negative for weight loss (Gained weight).  HENT: Positive for ear discharge. Negative for nosebleeds.   Respiratory: Positive for cough and shortness of breath.   Cardiovascular: Positive for leg swelling (Noted above).  Gastrointestinal: Negative for blood in stool and melena.  Musculoskeletal: Positive for back pain. Negative for joint pain.  Neurological: Positive for dizziness (Occasionally if she stands up too quickly). Negative for focal weakness and weakness.  Psychiatric/Behavioral: Positive for memory loss. Negative for depression. The patient is not nervous/anxious and does not have insomnia.  Some mild confusion/foggy headed.  Difficult concentration    The patient is practicing social distancing.  Past Medical History:  Diagnosis Date  . Atrial fibrillation (HCC)   . Benign tumor of meninges (HCC)   . Chronic combined systolic and diastolic CHF, NYHA class 2 and ACA/AHA stage C 01/2018   Exacerbated by A. fib; EF 35 to 40% with global HK.  Reduced cardiac output and index (3.2/1.9).   -Associated moderate pulmonary hypertension-(mean PA P 38 mmHg)  . GERD (gastroesophageal reflux disease)    occ. in past  . Headache(784.0)   . HTN (hypertension), benign    pt. states no hypertension  . NICM (nonischemic cardiomyopathy) (HCC) 01/2018   Echo with EF of 35 to 40%.  Global HK.  Mild to moderate MR.  Mild nonobstructive CAD by cath.  Cardiac output/index 3.2/1.9  . Pneumonia    ? as child  . PONV (postoperative nausea and vomiting)   . Seizures (HCC)     positive for HBP   Past Surgical History:  Procedure Laterality Date  . CARDIOVERSION N/A 02/24/2018   Procedure: CARDIOVERSION;  Surgeon: Quintella Reichert, MD;  Location: [facility] ENDOSCOPY;  Service: Cardiovascular;  Laterality: N/A;  . CATARACT EXTRACTION, BILATERAL Bilateral   . CHOLECYSTECTOMY    . COLONOSCOPY WITH PROPOFOL N/A 11/30/2012   Procedure: COLONOSCOPY WITH PROPOFOL;  Surgeon: Charolett Bumpers, MD;  Location: [facility] ENDOSCOPY;  Service: Endoscopy;  Laterality: N/A;  . ESOPHAGOGASTRODUODENOSCOPY (EGD) WITH PROPOFOL N/A 11/30/2012   Procedure: ESOPHAGOGASTRODUODENOSCOPY (EGD) WITH PROPOFOL;  Surgeon: Charolett Bumpers, MD;  Location: [facility] ENDOSCOPY;  Service: Endoscopy;  Laterality: N/A;  . ESOPHAGOGASTRODUODENOSCOPY (EGD) WITH PROPOFOL N/A 03/14/2015   Procedure: ESOPHAGOGASTRODUODENOSCOPY (EGD) WITH PROPOFOL;  Surgeon: Willis Modena, MD;  Location: [facility] ENDOSCOPY;  Service: Endoscopy;  Laterality: N/A;  . EUS N/A 01/05/2013   Procedure: FULL UPPER ENDOSCOPIC ULTRASOUND (EUS) RADIAL;  Surgeon: Willis Modena, MD;  Location: [facility] ENDOSCOPY;  Service: Endoscopy;  Laterality: N/A;  EUS with possible FNA  . EUS N/A 03/14/2015   Procedure: UPPER ENDOSCOPIC ULTRASOUND (EUS) RADIAL;  Surgeon: Willis Modena, MD;  Location: [facility] ENDOSCOPY;  Service: Endoscopy;  Laterality: N/A;  . EYE SURGERY Left    "few weeks ago scar tissue laser surgery"  . FINE NEEDLE ASPIRATION N/A 01/05/2013   Procedure: FINE NEEDLE ASPIRATION (FNA) RADIAL;  Surgeon: Willis Modena, MD;  Location: [facility] ENDOSCOPY;  Service: Endoscopy;  Laterality: N/A;  . KNEE ARTHROSCOPY Left   . MYELOMININGOCELE REPAIR  12/1990  . RIGHT/LEFT HEART CATH AND CORONARY ANGIOGRAPHY N/A 02/22/2018   Procedure: RIGHT/LEFT HEART CATH AND CORONARY ANGIOGRAPHY;  Surgeon: Laurey Morale, MD;  Location: [facility] INVASIVE CV LAB;;  Nonobstructive CAD.  Mild to moderate pulmonary hypertension (PA P 47/24 mmHg-mean 38 mmHg.  PCWP 22 mmHg with LVEDP 24 mmHg. CO/CI =  3.2/1.9  . TEE WITHOUT CARDIOVERSION N/A 02/24/2018   Procedure: TRANSESOPHAGEAL ECHOCARDIOGRAM (TEE) - WITH DCCV;  Surgeon: Quintella Reichert, MD;  Location: [facility] ENDOSCOPY;; EF 35-40%.  Global HK.  No R WMA. Mild AI, Mild-Mod MR. Severe LA dilation (no LAA thrombus).  Severely reduced RV function.  Mild RA dilation --successful DCCV  . TRANSTHORACIC ECHOCARDIOGRAM  02/18/2018   EF 35 to 40%.  Unable to assess diastolic function because of A. fib.  No obvious regional wall motion abnormality.  Global hypokinesis.  Severe LA dilation.  Mild-moderate MR.  Mild AI without AS  . VAGINAL HYSTERECTOMY  1983     Current Meds  Medication  Sig  . amiodarone (PACERONE) 200 MG tablet Take 1 tablet (200 mg total) by mouth daily.  . Calcium-Magnesium-Zinc (CAL-MAG-ZINC PO) Take 1 tablet by mouth daily at 12 noon.  . Cholecalciferol (VITAMIN D-3) 25 MCG (1000 UT) CAPS Take by mouth. Take 1 capsule daily  . ELIQUIS 5 MG TABS tablet TAKE 1 TABLET BY MOUTH TWICE DAILY (REPLACES  WARFARIN)  . furosemide (LASIX) 40 MG tablet Take 1 tablet (40 mg total) by mouth daily.  Marland Kitchen levETIRAcetam (KEPPRA) 500 MG tablet Take 1 tablet (500 mg total) by mouth 2 (two) times daily.  Marland Kitchen losartan (COZAAR) 50 MG tablet Take 1 tablet (50 mg total) by mouth daily.  . metoprolol succinate (TOPROL-XL) 50 MG 24 hr tablet Take 50 mg by mouth daily.  . Multiple Vitamin (MULTIVITAMIN WITH MINERALS) TABS tablet Take 1 tablet by mouth daily.  . pravastatin (PRAVACHOL) 40 MG tablet Take 40 mg by mouth daily.   . [DISCONTINUED] losartan (COZAAR) 25 MG tablet Take 1 tablet (25 mg total) by mouth daily.  taking Toprol 25 mg.    Allergies:   Dilantin [phenytoin sodium extended] and Hydrocodone   Social History   Tobacco Use  . Smoking status: Never Smoker  . Smokeless tobacco: Never Used  Vaping Use  . Vaping Use: Never used  Substance Use Topics  . Alcohol use: Yes    Alcohol/week: 0.0 standard drinks    Comment: once monthly  . Drug use:  No     Family Hx: The patient's family history includes Dementia in her father; Heart disease in an other family member.   Labs/Other Tests and Data Reviewed:   EKG:  No ECG reviewed.  Recent Labs: 12/10/2019: TSH 1.573 12/11/2019: Magnesium 2.0 12/21/2019: ALT 29; BUN 26; Creatinine, Ser 1.00; Hemoglobin 14.3; Platelets 322; Potassium 4.6; Sodium 142   Recent Lipid Panel Lab Results  Component Value Date/Time   CHOL 144 02/18/2018 12:27 AM   TRIG 71 02/18/2018 12:27 AM   HDL 55 02/18/2018 12:27 AM   CHOLHDL 2.6 02/18/2018 12:27 AM   LDLCALC 75 02/18/2018 12:27 AM    Wt Readings from Last 3 Encounters:  12/21/19 149 lb 12.8 oz (67.9 kg)  12/12/19 158 lb (71.7 kg)  06/28/19 155 lb (70.3 kg)     Objective:    Vital Signs:  BP (!) 163/88   Pulse 63   Ht _0  (1.549 m)   BMI 28.30 kg/m   VITAL SIGNS:  reviewed Well nourished, well developed female in no acute distress. A&O x 3.  normal Mood & Affect Non-labored respirations   ASSESSMENT & PLAN:    Problem List Items Addressed This Visit    Paroxysmal atrial fibrillation (HCC) (Chronic)    Recent breakthrough with difficult to control rates.  They chose to use amiodarone for chemical cardioversion.  Now remains in sinus rhythm/bradycardia.  I do not know that amiodarone needs to be a long-term medication, we will at least have it stay on for the short time in an attempt to try to avoid recurrent A. Fib --> In close follow-up, would like to see if we can potentially wean her off of amiodarone as a standing medication, and simply use as a PRN medication.  In that setting, we can potentially increase Toprol in the absence of amiodarone.  Continue Eliquis  This patients CHA2DS2-VASc Score and unadjusted Ischemic Stroke Rate (% per year) is equal to 9.7 % stroke rate/year from a score of 6  Above score calculated as  1 point each if present [CHF, HTN, DM, Vascular=MI/PAD/Aortic Plaque, Age if 65-74, or Female] Above score  calculated as 2 points each if present [Age > 75, or Stroke/TIA/TE]        Relevant Medications   metoprolol succinate (TOPROL-XL) 50 MG 24 hr tablet   losartan (COZAAR) 50 MG tablet   Chronic combined systolic and diastolic heart failure (HCC) - Primary (Chronic)    She has mild cardiomyopathy with chronic CHF exacerbated by A. fib.  Seems euvolemic now.  Possible diuretic regimen with the only recent exacerbation being in the setting of A. fib.  Usually when she is not in A. fib, she is controlled.  Plan: On reduced dose Toprol to 25 mg, we will increase losartan to 50 mg.  Continue current dose of furosemide with additional dosing as needed for edema.  Has not required any      Relevant Medications   metoprolol succinate (TOPROL-XL) 50 MG 24 hr tablet   losartan (COZAAR) 50 MG tablet   Other Relevant Orders   Lipid panel   Comprehensive metabolic panel   CBC   Sedimentation rate   C-reactive protein   Essential hypertension (Chronic)    Blood pressure is high today.  This is probably related to reducing Toprol dose and not increasing the medication. Plan: Agree with continuing 25 mg Toprol for now while she is on amiodarone.  We will increase losartan to 50 mg. She will monitor her blood pressures at home, if still averaging over 140/80 mmHg, we would increase to 10 mg amlodipine.  She is already on standing dose of diuretic/Lasix.  Avoid triggers that can increase blood pressure including caffeine sweets etc.      Relevant Medications   metoprolol succinate (TOPROL-XL) 50 MG 24 hr tablet   losartan (COZAAR) 50 MG tablet   Other Relevant Orders   Comprehensive metabolic panel   CBC   Sedimentation rate   C-reactive protein   Long term (current) use of anticoagulants; CHA2DS2-VASc score of 6 (Chronic)    Now on Eliquis with a CHA2DS2-VASc score of 6.  No signs of bleeding.  Tolerating well.      NICM (nonischemic cardiomyopathy) (HCC) (Chronic)   Relevant Medications    metoprolol succinate (TOPROL-XL) 50 MG 24 hr tablet   losartan (COZAAR) 50 MG tablet   Other Relevant Orders   Lipid panel   Comprehensive metabolic panel   CBC   Sedimentation rate   C-reactive protein   Medication monitoring encounter   Relevant Orders   Lipid panel   Comprehensive metabolic panel   CBC   Sedimentation rate   C-reactive protein   OSA (obstructive sleep apnea)    Other Visit Diagnoses    Encounter for monitoring amiodarone therapy       Relevant Orders   Sedimentation rate   C-reactive protein      COVID-19 Education: The signs and symptoms of COVID-19 were discussed with the patient and how to seek care for testing (follow up with PCP or arrange E-visit).   The importance of social distancing was discussed today.  Time:   Today, I have spent 22 minutes with the patient with telehealth technology discussing the above problems.  10 minutes charting.   Medication Adjustments/Labs and Tests Ordered: Current medicines are reviewed at length with the patient today.  Concerns regarding medicines are outlined above.   Patient Instructions  Medication Instructions:    Increase losartan dose to 50 mg daily (2 of current tablets)  ->  New Rx: Losartan 50 mg p.o. daily; #90 tabs, 3 refills.  *If you need a refill on your cardiac medications before your next appointment, please call your pharmacy*   Lab Work:  Labs: CMP, FLP, CBC, ESR and CRP -> to be checked at their convenience, 1 to 2 weeks.  If you have labs (blood work) drawn today and your tests are completely normal, you will receive your results only by: Marland Kitchen MyChart Message (if you have MyChart) OR . A paper copy in the mail If you have any lab test that is abnormal or we need to change your treatment, we will call you to review the results.   Testing/Procedures:  None for now   Follow-Up: At [facility], you and your health needs are our priority.  As part of our continuing mission to  provide you with exceptional heart care, we have created designated Provider Care Teams.  These Care Teams include your primary Cardiologist (physician) and Advanced Practice Providers (APPs -  Physician Assistants and Nurse Practitioners) who all work together to provide you with the care you need, when you need it.    Your next appointment:   3 month(s)- April 2022  The format for your next appointment:   In Person  Provider:   Glenetta Hew, MD   Other Instructions Continue to monitor your blood pressures.  If pressures average greater than 140/80 after about a week of the increased dose of losartan, let us know, we will likely increase the losartan further to 100 mg.    Signed, Glenetta Hew, MD  02/03/2020 1:36 PM    [facility]

## 2020-02-03 NOTE — Telephone Encounter (Signed)
  Patient Consent for Virtual Visit         Maureen [name] has provided verbal consent on 02/03/2020 for a virtual visit (video or telephone).   CONSENT FOR VIRTUAL VISIT FOR:  Maureen [name]  By participating in this virtual visit I agree to the following:  I hereby voluntarily request, consent and authorize [facility] and its employed or contracted physicians, physician assistants, nurse practitioners or other licensed health care professionals (the Practitioner), to provide me with telemedicine health care services (the "Services") as deemed necessary by the treating Practitioner. I acknowledge and consent to receive the Services by the Practitioner via telemedicine. I understand that the telemedicine visit will involve communicating with the Practitioner through live audiovisual communication technology and the disclosure of certain medical information by electronic transmission. I acknowledge that I have been given the opportunity to request an in-person assessment or other available alternative prior to the telemedicine visit and am voluntarily participating in the telemedicine visit.  I understand that I have the right to withhold or withdraw my consent to the use of telemedicine in the course of my care at any time, without affecting my right to future care or treatment, and that the Practitioner or I may terminate the telemedicine visit at any time. I understand that I have the right to inspect all information obtained and/or recorded in the course of the telemedicine visit and may receive copies of available information for a reasonable fee.  I understand that some of the potential risks of receiving the Services via telemedicine include:  Marland Kitchen Delay or interruption in medical evaluation due to technological equipment failure or disruption; . Information transmitted may not be sufficient (e.g. poor resolution of images) to allow for appropriate medical decision making by the Practitioner;  and/or  . In rare instances, security protocols could fail, causing a breach of personal health information.  Furthermore, I acknowledge that it is my responsibility to provide information about my medical history, conditions and care that is complete and accurate to the best of my ability. I acknowledge that Practitioner's advice, recommendations, and/or decision may be based on factors not within their control, such as incomplete or inaccurate data provided by me or distortions of diagnostic images or specimens that may result from electronic transmissions. I understand that the practice of medicine is not an exact science and that Practitioner makes no warranties or guarantees regarding treatment outcomes. I acknowledge that a copy of this consent can be made available to me via my patient portal ([facility]), or I can request a printed copy by calling the office of [facility].    I understand that my insurance will be billed for this visit.   I have read or had this consent read to me. . I understand the contents of this consent, which adequately explains the benefits and risks of the Services being provided via telemedicine.  . I have been provided ample opportunity to ask questions regarding this consent and the Services and have had my questions answered to my satisfaction. . I give my informed consent for the services to be provided through the use of telemedicine in my medical care

## 2020-02-03 NOTE — Assessment & Plan Note (Signed)
Now on Eliquis with a CHA2DS2-VASc score of 6.  No signs of bleeding.  Tolerating well.

## 2020-02-03 NOTE — Assessment & Plan Note (Signed)
Blood pressure is high today.  This is probably related to reducing Toprol dose and not increasing the medication. Plan: Agree with continuing 25 mg Toprol for now while she is on amiodarone.  We will increase losartan to 50 mg. She will monitor her blood pressures at home, if still averaging over 140/80 mmHg, we would increase to 10 mg amlodipine.  She is already on standing dose of diuretic/Lasix.  Avoid triggers that can increase blood pressure including caffeine sweets etc.

## 2020-02-03 NOTE — Patient Instructions (Addendum)
Medication Instructions:    Increase losartan dose to 50 mg daily (2 of current tablets)  -> New Rx: Losartan 50 mg p.o. daily; #90 tabs, 3 refills.  *If you need a refill on your cardiac medications before your next appointment, please call your pharmacy*   Lab Work:  Labs: CMP, FLP, CBC, ESR and CRP -> to be checked at their convenience, 1 to 2 weeks.  If you have labs (blood work) drawn today and your tests are completely normal, you will receive your results only by: Marland Kitchen MyChart Message (if you have MyChart) OR . A paper copy in the mail If you have any lab test that is abnormal or we need to change your treatment, we will call you to review the results.   Testing/Procedures:  None for now   Follow-Up: At [facility], you and your health needs are our priority.  As part of our continuing mission to provide you with exceptional heart care, we have created designated Provider Care Teams.  These Care Teams include your primary Cardiologist (physician) and Advanced Practice Providers (APPs -  Physician Assistants and Nurse Practitioners) who all work together to provide you with the care you need, when you need it.    Your next appointment:   3 month(s)- April 2022  The format for your next appointment:   In Person  Provider:   Glenetta Hew, MD   Other Instructions Continue to monitor your blood pressures.  If pressures average greater than 140/80 after about a week of the increased dose of losartan, let us know, we will likely increase the losartan further to 100 mg.

## 2020-02-03 NOTE — Assessment & Plan Note (Signed)
She has mild cardiomyopathy with chronic CHF exacerbated by A. fib.  Seems euvolemic now.  Possible diuretic regimen with the only recent exacerbation being in the setting of A. fib.  Usually when she is not in A. fib, she is controlled.  Plan: On reduced dose Toprol to 25 mg, we will increase losartan to 50 mg.  Continue current dose of furosemide with additional dosing as needed for edema.  Has not required any

## 2020-02-03 NOTE — Assessment & Plan Note (Addendum)
Recent breakthrough with difficult to control rates.  They chose to use amiodarone for chemical cardioversion.  Now remains in sinus rhythm/bradycardia.  I do not know that amiodarone needs to be a long-term medication, we will at least have it stay on for the short time in an attempt to try to avoid recurrent A. Fib --> In close follow-up, would like to see if we can potentially wean her off of amiodarone as a standing medication, and simply use as a PRN medication.  In that setting, we can potentially increase Toprol in the absence of amiodarone.  Continue Eliquis  This patients CHA2DS2-VASc Score and unadjusted Ischemic Stroke Rate (% per year) is equal to 9.7 % stroke rate/year from a score of 6  Above score calculated as 1 point each if present [CHF, HTN, DM, Vascular=MI/PAD/Aortic Plaque, Age if 65-74, or Female] Above score calculated as 2 points each if present [Age > 75, or Stroke/TIA/TE]

## 2020-02-22 DIAGNOSIS — I428 Other cardiomyopathies: Secondary | ICD-10-CM | POA: Diagnosis not present

## 2020-02-22 DIAGNOSIS — Z79899 Other long term (current) drug therapy: Secondary | ICD-10-CM | POA: Diagnosis not present

## 2020-02-22 DIAGNOSIS — I10 Essential (primary) hypertension: Secondary | ICD-10-CM | POA: Diagnosis not present

## 2020-02-22 DIAGNOSIS — I5042 Chronic combined systolic (congestive) and diastolic (congestive) heart failure: Secondary | ICD-10-CM | POA: Diagnosis not present

## 2020-02-22 DIAGNOSIS — Z5181 Encounter for therapeutic drug level monitoring: Secondary | ICD-10-CM | POA: Diagnosis not present

## 2020-02-23 LAB — CBC
Hematocrit: 36.8 % (ref 34.0–46.6)
Hemoglobin: 12.5 g/dL (ref 11.1–15.9)
MCH: 28.9 pg (ref 26.6–33.0)
MCHC: 34 g/dL (ref 31.5–35.7)
MCV: 85 fL (ref 79–97)
Platelets: 265 10*3/uL (ref 150–450)
RBC: 4.33 x10E6/uL (ref 3.77–5.28)
RDW: 14.3 % (ref 11.7–15.4)
WBC: 5.6 10*3/uL (ref 3.4–10.8)

## 2020-02-23 LAB — LIPID PANEL
Chol/HDL Ratio: 3 ratio (ref 0.0–4.4)
Cholesterol, Total: 194 mg/dL (ref 100–199)
HDL: 64 mg/dL (ref >39)
LDL Chol Calc (NIH): 114 mg/dL — ABNORMAL HIGH (ref 0–99)
Triglycerides: 91 mg/dL (ref 0–149)
VLDL Cholesterol Cal: 16 mg/dL (ref 5–40)

## 2020-02-23 LAB — COMPREHENSIVE METABOLIC PANEL
ALT: 32 IU/L (ref 0–32)
AST: 27 IU/L (ref 0–40)
Albumin/Globulin Ratio: 2.4 — ABNORMAL HIGH (ref 1.2–2.2)
Albumin: 4.5 g/dL (ref 3.6–4.6)
Alkaline Phosphatase: 62 IU/L (ref 44–121)
BUN/Creatinine Ratio: 20 (ref 12–28)
BUN: 20 mg/dL (ref 8–27)
Bilirubin Total: 0.5 mg/dL (ref 0.0–1.2)
CO2: 29 mmol/L (ref 20–29)
Calcium: 9.5 mg/dL (ref 8.7–10.3)
Chloride: 102 mmol/L (ref 96–106)
Creatinine, Ser: 0.98 mg/dL (ref 0.57–1.00)
GFR calc Af Amer: 63 mL/min/{1.73_m2} (ref >59)
GFR calc non Af Amer: 54 mL/min/{1.73_m2} — ABNORMAL LOW (ref >59)
Globulin, Total: 1.9 g/dL (ref 1.5–4.5)
Glucose: 116 mg/dL — ABNORMAL HIGH (ref 65–99)
Potassium: 4.6 mmol/L (ref 3.5–5.2)
Sodium: 141 mmol/L (ref 134–144)
Total Protein: 6.4 g/dL (ref 6.0–8.5)

## 2020-02-23 LAB — C-REACTIVE PROTEIN: CRP: 1 mg/L (ref 0–10)

## 2020-02-23 LAB — SEDIMENTATION RATE: Sed Rate: 5 mm/hr (ref 0–40)

## 2020-04-03 DIAGNOSIS — I509 Heart failure, unspecified: Secondary | ICD-10-CM | POA: Diagnosis not present

## 2020-04-03 DIAGNOSIS — F411 Generalized anxiety disorder: Secondary | ICD-10-CM | POA: Diagnosis not present

## 2020-04-03 DIAGNOSIS — E7800 Pure hypercholesterolemia, unspecified: Secondary | ICD-10-CM | POA: Diagnosis not present

## 2020-04-03 DIAGNOSIS — I4891 Unspecified atrial fibrillation: Secondary | ICD-10-CM | POA: Diagnosis not present

## 2020-04-03 DIAGNOSIS — D6869 Other thrombophilia: Secondary | ICD-10-CM | POA: Diagnosis not present

## 2020-04-03 DIAGNOSIS — Z0000 Encounter for general adult medical examination without abnormal findings: Secondary | ICD-10-CM | POA: Diagnosis not present

## 2020-04-03 DIAGNOSIS — R7301 Impaired fasting glucose: Secondary | ICD-10-CM | POA: Diagnosis not present

## 2020-04-03 DIAGNOSIS — I420 Dilated cardiomyopathy: Secondary | ICD-10-CM | POA: Diagnosis not present

## 2020-04-03 DIAGNOSIS — I700 Atherosclerosis of aorta: Secondary | ICD-10-CM | POA: Diagnosis not present

## 2020-04-03 DIAGNOSIS — I10 Essential (primary) hypertension: Secondary | ICD-10-CM | POA: Diagnosis not present

## 2020-04-03 DIAGNOSIS — E042 Nontoxic multinodular goiter: Secondary | ICD-10-CM | POA: Diagnosis not present

## 2020-04-18 DIAGNOSIS — H18519 Endothelial corneal dystrophy, unspecified eye: Secondary | ICD-10-CM | POA: Diagnosis not present

## 2020-04-18 DIAGNOSIS — H43393 Other vitreous opacities, bilateral: Secondary | ICD-10-CM | POA: Diagnosis not present

## 2020-04-24 ENCOUNTER — Telehealth: Payer: Self-pay | Admitting: Cardiology

## 2020-04-24 NOTE — Telephone Encounter (Signed)
Pt c/o medication issue:  1. Name of Medication:  losartan (COZAAR) 50 MG tablet  2. How are you currently taking this medication (dosage and times per day)? Need confirmation  3. Are you having a reaction (difficulty breathing--STAT)? no  4. What is your medication issue? Maureen [name] from Dr. Raul Del office states the patient told them her losartan was discontinued, but in the last office note they have it looks like it was increased. She states the patient also said she spoke with Dr. Ellyn Hack in February, but the last appointment they have her for was in January. They need to confirm whether the losartan is discontinued. Phone: [phone]

## 2020-04-24 NOTE — Telephone Encounter (Signed)
Spoke to [place] at [employer] office.Stated patient called her to discuss her medications.Stated she wanted to make sure Dr.Shaw and Dr.Harding were on the same page.Stated patient told her Dr.Harding stopped her Losartan.Stated Dr.Shaw read Dr.Hardings last tele visit note and it does not say to stop Losartan.Stated she wanted to know if she was suppose to stop taking Losartan.Advised Dr.Harding is out office today.I will send message to him for advice.

## 2020-04-24 NOTE — Telephone Encounter (Signed)
Patient has an appointment 04/25/20 tomorrow

## 2020-04-25 ENCOUNTER — Other Ambulatory Visit: Payer: Self-pay

## 2020-04-25 ENCOUNTER — Encounter: Payer: Self-pay | Admitting: Cardiology

## 2020-04-25 ENCOUNTER — Ambulatory Visit: Payer: Medicare PPO | Admitting: Cardiology

## 2020-04-25 VITALS — BP 130/74 | HR 56 | Ht 61.0 in | Wt 152.4 lb

## 2020-04-25 DIAGNOSIS — Z7901 Long term (current) use of anticoagulants: Secondary | ICD-10-CM | POA: Diagnosis not present

## 2020-04-25 DIAGNOSIS — I480 Paroxysmal atrial fibrillation: Secondary | ICD-10-CM

## 2020-04-25 DIAGNOSIS — I5042 Chronic combined systolic (congestive) and diastolic (congestive) heart failure: Secondary | ICD-10-CM

## 2020-04-25 DIAGNOSIS — I428 Other cardiomyopathies: Secondary | ICD-10-CM | POA: Diagnosis not present

## 2020-04-25 DIAGNOSIS — Z5181 Encounter for therapeutic drug level monitoring: Secondary | ICD-10-CM | POA: Diagnosis not present

## 2020-04-25 NOTE — Patient Instructions (Addendum)
Medication Instructions:    continue taking Losartan 50 mg daily     one month statin holiday - meaning do not take Pravastatin 40 mg  for 1 month if symptoms of memory confusion does not get better - it is not medication , if memory confusion gets better contact office for a change in medications  *If you need a refill on your cardiac medications before your next appointment, please call your pharmacy   Lab Work: Not needed  Testing/Procedures:  Not needed   Follow-Up: At [facility], you and your health needs are our priority.  As part of our continuing mission to provide you with exceptional heart care, we have created designated Provider Care Teams.  These Care Teams include your primary Cardiologist (physician) and Advanced Practice Providers (APPs -  Physician Assistants and Nurse Practitioners) who all work together to provide you with the care you need, when you need it.     Your next appointment:   12 month(s)  The format for your next appointment:   In Person  Provider:   Glenetta Hew, MD

## 2020-04-25 NOTE — Progress Notes (Signed)
Primary Care Provider: Mayra Neer, MD Cardiologist: Glenetta Hew, MD Electrophysiologist: None  Clinic Note: Chief Complaint  Patient presents with  . Follow-up    Doing well.  In person visit.    . Atrial Fibrillation    Stable.  On amiodarone  . Congestive Heart Failure    Stable.  Euvolemic  ===================================  ASSESSMENT/PLAN   Problem List Items Addressed This Visit    Medication monitoring encounter    On amiodarone, need to check annual TFTs, PFTs, LFTs and eye exam.,       Relevant Orders   EKG 12-Lead (Completed)   Paroxysmal atrial fibrillation ([place]): CHA2DS2-VASc score 8 - Primary (Chronic)    Seems to be maintaining sinus rhythm.  He is now on maintenance dose amiodarone based on her symptomatic she was.  Now on 50 mg of Toprol with borderline sinus bradycardia.  Would not titrate further.  On Eliquis for maintenance anticoagulation.  Stable with no bleeding issues.   Okay to hold Eliquis for procedures or surgeries-2 to 3 days preop.  (2 days for standard procedures, 3 days for high risk bleeding procedures/neuro/spinal)      Relevant Orders   EKG 12-Lead (Completed)   Chronic combined systolic and diastolic heart failure (HCC) (Chronic)    Mild cardiomyopathy exacerbated by A. fib.  Euvolemic.  On stable dose of beta-blocker, ARB and diuretic.  Can discuss sliding scale Lasix.  She seems to be doing well as long as she remains out of A. fib.  We therefore using amiodarone for rhythm control.      Relevant Orders   EKG 12-Lead (Completed)   Long term (current) use of anticoagulants; CHA2DS2-VASc score of 8 (Chronic)    Significantly elevated CHA2DS2-VASc score of 8 (previously reported score of 6 did not include CHF and aortic plaque).  Seems to be stable on current dose of Eliquis.  No bleeding issues.      NICM (nonischemic cardiomyopathy) (HCC) (Chronic)    EF improved from February 2020 until recent neck echo despite  being in A. fib RVR.  Still has an EF of roughly 45%.  Slowly improving symptomatically.  Definitely exacerbated by A. fib.  On ARB and Toprol. Unsteadiness Lasix, not requiring any breakthrough dosing.         ===================================  HPI:    Maureen [name] is a 80 y.o. female with a PMH below for PAF and HFrEF who presents today for 37-month in person follow-up.  Maureen [name] was last seen on February 03, 2020 via telemedicine as a 1 month follow-up for hospital follow-up visit after presenting in November 2021 with A. fib RVR.  Apparently she had about 2 to 3 weeks of palpitations, then started feeling worsening dyspnea with some chest discomfort for the last week prior to presentation.  Was found to be in atrial fibrillation with rapid rate and CHF.  She was discharged on amiodarone and reduced dose Toprol 50 mg daily, converted to losartan with Lasix 40 mg added.  But not really noticing significant edema.  supposed be on CPAP, but has declined use. => Amiodarone reduced to 200 mg standing dose, and Toprol reduced to 25 mg during initial follow-up visit. => She gained weight over Christmas, but no significant worsening edema.  Mild memory issues.  A little more tired than usual mostly deconditioned.  More sedentary.  Recent Hospitalizations: None since last visit  Reviewed  CV studies:    The following studies were reviewed today: (if  available, images/films reviewed: From Epic Chart or Care Everywhere) . None since last visit:  Interval History:   Maureen [name] returns here for 35-month follow-up stating that she is doing relatively well.  She has not had no further symptoms of A. fib.  She says he is working on getting into better shape, starting to do more activity but still does note some exertional dyspnea if she is walking for a while.  She is now building up her level of tolerance.  She had 1 episode where she felt lightheaded and dizzy as though she was about to  pass out, was found to be hypoglycemic.  Improved with eating something.  She really has not had any chest pain or pressure with rest or exertion.  No real PND orthopnea stable edema.  But she noticed some mild aching cramping sensations throughout.  Also some left sided paresthesias probably residual results of stroke.  No bleeding issues on DOAC.  CV Review of Symptoms (Summary): positive for - dyspnea on exertion and Exercise fatigue, but improving each day.  1 near syncopal episode related to hypoglycemia. negative for - edema, irregular heartbeat, loss of consciousness, orthopnea, palpitations, paroxysmal nocturnal dyspnea, rapid heart rate, shortness of breath or TIA/amaurosis fugax, claudication  The patient does not have symptoms concerning for COVID-19 infection (fever, chills, cough, or new shortness of breath).   REVIEWED OF SYSTEMS   Review of Systems  Constitutional: Positive for weight loss. Negative for malaise/fatigue (Mild exercise intolerance, but improving.).  HENT: Negative for congestion and nosebleeds.   Respiratory: Negative for cough and shortness of breath.   Gastrointestinal: Negative for blood in stool and melena.  Genitourinary: Negative for hematuria.  Musculoskeletal: Positive for joint pain and myalgias. Negative for falls.  Neurological: Positive for dizziness (Related to hypoglycemia). Negative for focal weakness and weakness.  Endo/Heme/Allergies: Does not bruise/bleed easily.  Psychiatric/Behavioral: Positive for memory loss (Mild memory loss and some confusion.). Negative for depression. The patient is nervous/anxious. The patient does not have insomnia.    I have reviewed and (if needed) personally updated the patient's problem list, medications, allergies, past medical and surgical history, social and family history.   PAST MEDICAL HISTORY   Past Medical History:  Diagnosis Date  . Atrial fibrillation ([place])   . Benign tumor of meninges ([place])   .  Chronic combined systolic and diastolic CHF, NYHA class 2 and ACA/AHA stage C 01/2018   Exacerbated by A. fib; EF 35 to 40% with global HK.  Reduced cardiac output and index (3.2/1.9).   -Associated moderate pulmonary hypertension-(mean PA P 38 mmHg)  . GERD (gastroesophageal reflux disease)    occ. in past  . Headache(784.0)   . HTN (hypertension), benign    pt. states no hypertension  . NICM (nonischemic cardiomyopathy) ([place]) 01/2018   Echo with EF of 35 to 40%.  Global HK.  Mild to moderate MR.  Mild nonobstructive CAD by cath.  Cardiac output/index 3.2/1.9  . Pneumonia    ? as child  . PONV (postoperative nausea and vomiting)   . Seizures ([place])    positive for HBP    PAST SURGICAL HISTORY   Past Surgical History:  Procedure Laterality Date  . CARDIOVERSION N/A 02/24/2018   Procedure: CARDIOVERSION;  Surgeon: Sueanne Margarita, MD;  Location: [facility] ENDOSCOPY;  Service: Cardiovascular;  Laterality: N/A;  . CATARACT EXTRACTION, BILATERAL Bilateral   . CHOLECYSTECTOMY    . COLONOSCOPY WITH PROPOFOL N/A 11/30/2012   Procedure: COLONOSCOPY WITH PROPOFOL;  Surgeon: Garlan Fair, MD;  Location: [facility] ENDOSCOPY;  Service: Endoscopy;  Laterality: N/A;  . ESOPHAGOGASTRODUODENOSCOPY (EGD) WITH PROPOFOL N/A 11/30/2012   Procedure: ESOPHAGOGASTRODUODENOSCOPY (EGD) WITH PROPOFOL;  Surgeon: Garlan Fair, MD;  Location: [facility] ENDOSCOPY;  Service: Endoscopy;  Laterality: N/A;  . ESOPHAGOGASTRODUODENOSCOPY (EGD) WITH PROPOFOL N/A 03/14/2015   Procedure: ESOPHAGOGASTRODUODENOSCOPY (EGD) WITH PROPOFOL;  Surgeon: Arta Silence, MD;  Location: [facility] ENDOSCOPY;  Service: Endoscopy;  Laterality: N/A;  . EUS N/A 01/05/2013   Procedure: FULL UPPER ENDOSCOPIC ULTRASOUND (EUS) RADIAL;  Surgeon: Arta Silence, MD;  Location: [facility] ENDOSCOPY;  Service: Endoscopy;  Laterality: N/A;  EUS with possible FNA  . EUS N/A 03/14/2015   Procedure: UPPER ENDOSCOPIC ULTRASOUND (EUS) RADIAL;  Surgeon: Arta Silence, MD;  Location:  [facility] ENDOSCOPY;  Service: Endoscopy;  Laterality: N/A;  . EYE SURGERY Left    "few weeks ago scar tissue laser surgery"  . FINE NEEDLE ASPIRATION N/A 01/05/2013   Procedure: FINE NEEDLE ASPIRATION (FNA) RADIAL;  Surgeon: Arta Silence, MD;  Location: [facility] ENDOSCOPY;  Service: Endoscopy;  Laterality: N/A;  . KNEE ARTHROSCOPY Left   . [place]  12/1990  . RIGHT/LEFT HEART CATH AND CORONARY ANGIOGRAPHY N/A 02/22/2018   Procedure: RIGHT/LEFT HEART CATH AND CORONARY ANGIOGRAPHY;  Surgeon: Larey Dresser, MD;  Location: [facility] INVASIVE CV LAB;;  Nonobstructive CAD.  Mild to moderate pulmonary hypertension (PA P 47/24 mmHg-mean 38 mmHg.  PCWP 22 mmHg with LVEDP 24 mmHg. CO/CI = 3.2/1.9  . TEE WITHOUT CARDIOVERSION N/A 02/24/2018   Procedure: TRANSESOPHAGEAL ECHOCARDIOGRAM (TEE) - WITH DCCV;  Surgeon: Sueanne Margarita, MD;  Location: [facility] ENDOSCOPY;; EF 35-40%.  Global HK.  No R WMA. Mild AI, Mild-Mod MR. Severe LA dilation (no LAA thrombus).  Severely reduced RV function.  Mild RA dilation --successful DCCV  . TRANSTHORACIC ECHOCARDIOGRAM  02/18/2018   EF 35 to 40%.  Unable to assess diastolic function because of A. fib.  No obvious regional wall motion abnormality.  Global hypokinesis.  Severe LA dilation.  Mild-moderate MR.  Mild AI without AS  . TRANSTHORACIC ECHOCARDIOGRAM  11/2019   CHF with A. fib RVR:  EF 45 to 50%.  Mildly reduced function.  GR 3 DD with elevated LAP.  Estimated RVP/PAP 72 to 73 mmHg.  Moderate MR and TR.  Mild to moderate PR.  Suggested CVP 15 mmHg.  Interestingly, despite the severe elevated pressures.  Both right and left atria were both normal size.  Marland Kitchen VAGINAL HYSTERECTOMY  1983    Immunization History  Administered Date(s) Administered  . PFIZER(Purple Top)SARS-COV-2 Vaccination 02/11/2019, 03/04/2019    MEDICATIONS/ALLERGIES   Current Meds  Medication Sig  . amiodarone (PACERONE) 200 MG tablet Take 1 tablet (200 mg total) by mouth daily.  .  Calcium-Magnesium-Zinc (CAL-MAG-ZINC PO) Take 1 tablet by mouth daily at 12 noon.  . Cholecalciferol (VITAMIN D-3) 25 MCG (1000 UT) CAPS Take by mouth. Take 1 capsule daily  . ELIQUIS 5 MG TABS tablet TAKE 1 TABLET BY MOUTH TWICE DAILY (REPLACES  WARFARIN)  . furosemide (LASIX) 40 MG tablet Take 1 tablet (40 mg total) by mouth daily.  Marland Kitchen levETIRAcetam (KEPPRA) 500 MG tablet Take 1 tablet (500 mg total) by mouth 2 (two) times daily.  Marland Kitchen losartan (COZAAR) 50 MG tablet Take 1 tablet (50 mg total) by mouth daily.  . metoprolol succinate (TOPROL-XL) 50 MG 24 hr tablet Take 50 mg by mouth daily.  . Multiple Vitamin (MULTIVITAMIN WITH MINERALS) TABS tablet Take 1 tablet by mouth  daily.  . pravastatin (PRAVACHOL) 40 MG tablet Take 40 mg by mouth daily.     Allergies  Allergen Reactions  . Dilantin [Phenytoin Sodium Extended]      Stupor, ataxia.   Marland Kitchen Hydrocodone Nausea And Vomiting    SOCIAL HISTORY/FAMILY HISTORY   Reviewed in Epic:  Pertinent findings:  Social History   Tobacco Use  . Smoking status: Never Smoker  . Smokeless tobacco: Never Used  Vaping Use  . Vaping Use: Never used  Substance Use Topics  . Alcohol use: Yes    Alcohol/week: 0.0 standard drinks    Comment: once monthly  . Drug use: No   Social History   Social History Narrative   She retired in 1995 from [employer]. Lives at home with her husband, Jan. They have a son age 30 and a  son age 92. Cafffeine once daily. a week and alcohol once a month. Denies tobacco and illicit drug use.    Patient is left handed.   Patient has a Master's degree.    OBJCTIVE -PE, EKG, labs   Wt Readings from Last 3 Encounters:  04/25/20 152 lb 6.4 oz (69.1 kg)  12/21/19 149 lb 12.8 oz (67.9 kg)  12/12/19 158 lb (71.7 kg)    Physical Exam: BP 130/74   Pulse (!) 56   Ht 5\' 1"  (1.549 m)   Wt 152 lb 6.4 oz (69.1 kg)   SpO2 (!) 7%   BMI 28.80 kg/m  Physical Exam Vitals reviewed.  Constitutional:      General:  She is not in acute distress.    Appearance: Normal appearance. She is normal weight. She is not ill-appearing, toxic-appearing or diaphoretic.  HENT:     Head: Normocephalic and atraumatic.  Neck:     Vascular: No carotid bruit or JVD.  Cardiovascular:     Rate and Rhythm: Regular rhythm. Bradycardia present.  No extrasystoles are present.    Chest Wall: PMI is not displaced.     Pulses: Intact distal pulses.     Heart sounds: Normal heart sounds. No murmur heard. No friction rub. No gallop.   Pulmonary:     Effort: Pulmonary effort is normal. No respiratory distress.     Breath sounds: Normal breath sounds. No wheezing, rhonchi or rales.  Chest:     Chest wall: No tenderness.  Musculoskeletal:        General: Swelling (Trivial) present. Normal range of motion.     Cervical back: Normal range of motion and neck supple.  Skin:    General: Skin is warm and dry.  Neurological:     General: No focal deficit present.     Mental Status: She is alert and oriented to person, place, and time.     Comments: A little less clear thought processes.  Psychiatric:        Mood and Affect: Mood normal.        Behavior: Behavior normal.        Thought Content: Thought content normal.        Judgment: Judgment normal.     Adult ECG Report  Rate: 56;  Rhythm: sinus bradycardia and Left axis deviation.  LVH with QRS widening.  Cannot exclude septal MI, age-indeterminate.;   Narrative Interpretation: Stable  Recent Labs: Reviewed Lab Results  Component Value Date   CHOL 194 02/22/2020   HDL 64 02/22/2020   LDLCALC 114 (H) 02/22/2020   TRIG 91 02/22/2020   CHOLHDL 3.0 02/22/2020  Lab Results  Component Value Date   CREATININE 0.98 02/22/2020   BUN 20 02/22/2020   NA 141 02/22/2020   K 4.6 02/22/2020   CL 102 02/22/2020   CO2 29 02/22/2020   CBC Latest Ref Rng & Units 02/22/2020 12/21/2019 12/12/2019  WBC 3.4 - 10.8 x10E3/uL 5.6 6.9 8.3  Hemoglobin 11.1 - 15.9 g/dL 12.5 14.3 11.4(L)   Hematocrit 34.0 - 46.6 % 36.8 41.9 35.5(L)  Platelets 150 - 450 x10E3/uL 265 322 266    Lab Results  Component Value Date   TSH 1.573 12/10/2019    ==================================================  COVID-19 Education: The signs and symptoms of COVID-19 were discussed with the patient and how to seek care for testing (follow up with PCP or arrange E-visit).   The importance of social distancing and COVID-19 vaccination was discussed today. The patient is practicing social distancing & Masking.   I spent a total of 18 minutes with the patient spent in direct patient consultation.  Additional time spent with chart review  / charting (studies, outside notes, etc): 13 min Total Time: 31 min  Current medicines are reviewed at length with the patient today.  (+/- concerns) having some mild aching and cramping  This visit occurred during the SARS-CoV-2 public health emergency.  Safety protocols were in place, including screening questions prior to the visit, additional usage of staff PPE, and extensive cleaning of exam room while observing appropriate contact time as indicated for disinfecting solutions.  Notice: This dictation was prepared with Dragon dictation along with smaller phrase technology. Any transcriptional errors that result from this process are unintentional and may not be corrected upon review.  Patient Instructions / Medication Changes & Studies & Tests Ordered   Patient Instructions  Medication Instructions:    continue taking Losartan 50 mg daily     one month statin holiday - meaning do not take Pravastatin 40 mg  for 1 month if symptoms of memory confusion does not get better - it is not medication , if memory confusion gets better contact office for a change in medications  *If you need a refill on your cardiac medications before your next appointment, please call your pharmacy   Lab Work: Not needed  Testing/Procedures:  Not needed   Follow-Up: At  [facility], you and your health needs are our priority.  As part of our continuing mission to provide you with exceptional heart care, we have created designated Provider Care Teams.  These Care Teams include your primary Cardiologist (physician) and Advanced Practice Providers (APPs -  Physician Assistants and Nurse Practitioners) who all work together to provide you with the care you need, when you need it.     Your next appointment:   12 month(s)  The format for your next appointment:   In Person  Provider:   Glenetta Hew, MD     Studies Ordered:   Orders Placed This Encounter  Procedures  . EKG 12-Lead     Glenetta Hew, M.D., M.S. Interventional Cardiologist   Pager # [phone] Phone # [phone] [address] [place],  [address]   Thank you for choosing Heartcare at [facility]!!

## 2020-05-13 ENCOUNTER — Encounter: Payer: Self-pay | Admitting: Cardiology

## 2020-05-13 NOTE — Assessment & Plan Note (Addendum)
Seems to be maintaining sinus rhythm.  He is now on maintenance dose amiodarone based on her symptomatic she was.  Now on 50 mg of Toprol with borderline sinus bradycardia.  Would not titrate further.  On Eliquis for maintenance anticoagulation.  Stable with no bleeding issues.   Okay to hold Eliquis for procedures or surgeries-2 to 3 days preop.  (2 days for standard procedures, 3 days for high risk bleeding procedures/neuro/spinal)

## 2020-05-13 NOTE — Assessment & Plan Note (Signed)
On amiodarone, need to check annual TFTs, PFTs, LFTs and eye exam.,

## 2020-05-13 NOTE — Assessment & Plan Note (Signed)
Significantly elevated CHA2DS2-VASc score of 8 (previously reported score of 6 did not include CHF and aortic plaque).  Seems to be stable on current dose of Eliquis.  No bleeding issues.

## 2020-05-13 NOTE — Assessment & Plan Note (Signed)
EF improved from February 2020 until recent neck echo despite being in A. fib RVR.  Still has an EF of roughly 45%.  Slowly improving symptomatically.  Definitely exacerbated by A. fib.  On ARB and Toprol. Unsteadiness Lasix, not requiring any breakthrough dosing.

## 2020-05-13 NOTE — Assessment & Plan Note (Signed)
Mild cardiomyopathy exacerbated by A. fib.  Euvolemic.  On stable dose of beta-blocker, ARB and diuretic.  Can discuss sliding scale Lasix.  She seems to be doing well as long as she remains out of A. fib.  We therefore using amiodarone for rhythm control.

## 2020-06-26 ENCOUNTER — Telehealth: Payer: Self-pay | Admitting: Family Medicine

## 2020-06-26 NOTE — Telephone Encounter (Signed)
6/14 OV cancelled due to np Amy being out of office.

## 2020-07-03 ENCOUNTER — Ambulatory Visit: Payer: Medicare PPO | Admitting: Family Medicine

## 2020-07-26 ENCOUNTER — Other Ambulatory Visit: Payer: Self-pay | Admitting: *Deleted

## 2020-07-26 MED ORDER — LEVETIRACETAM 500 MG PO TABS: 500.0000 mg | ORAL_TABLET | Freq: Two times a day (BID) | ORAL | 0 refills | Status: DC

## 2020-07-31 DIAGNOSIS — M85852 Other specified disorders of bone density and structure, left thigh: Secondary | ICD-10-CM | POA: Diagnosis not present

## 2020-08-08 ENCOUNTER — Other Ambulatory Visit: Payer: Self-pay | Admitting: Cardiology

## 2020-08-08 NOTE — Telephone Encounter (Signed)
Prescription refill request for Eliquis received. Indication:afib  Last office visit:[place] 04/25/20 Scr:0.98 02/22/20 Age: 23f Weight:69.1kg

## 2020-09-18 NOTE — Patient Instructions (Addendum)
Below is our plan:  We will continue levetiracetam '500mg'$  twice daily. I will order repeat imaging to monitor meningiomas. Please call me if you do not hear back to schedule this.   Please make sure you are staying well hydrated. I recommend 50-60 ounces daily. Well balanced diet and regular exercise encouraged. Consistent sleep schedule with 6-8 hours recommended.   Please continue follow up with care team as directed.   Follow up with me in 1 year   You may receive a survey regarding today's visit. I encourage you to leave honest feed back as I do use this information to improve patient care. Thank you for seeing me today!

## 2020-09-18 NOTE — Progress Notes (Signed)
PATIENT: Maureen [name] DOB: 1940/08/14  REASON FOR VISIT: follow up HISTORY FROM: patient  Chief Complaint  Patient presents with   Follow-up    New rm, alone, states she is stable reports no seizures, c/o headaches 1-2 a week     HISTORY OF PRESENT ILLNESS: 09/19/20 ALL:  [name] returns for follow up for seizures, history of meningioma and OSA. She continues levetiracetam '500mg'$  BID. No recent seizure activity.  She is tolerating medication well with no obvious adverse effects.   She continues to decline OSA treatment.   MRI was ordered last year for monitoring of meningiomas.  Last MRI in March 2020 did show enlargement of previous right and left frontal meningiomas and new right frontal lesion. She did not have imaging completed. She reports going to the wrong imaging center and can not remember if she ever rescheduled imaging.   06/28/2019 ALL: Maureen [name] is a 80 y.o. female here today for follow up for history of epilepsy. She continues levetiracetam 500 BID. She denies any seizure activity since last being seen. She does mention an episode three months ago where she had "tightening" of the muscles of right lower extremity for about 30 minutes. She was lying in bed and noted symptoms. She was completely alert. She reports her husband was asleep so she did not want to make noises but could have talked if she needed to. She tried rolling over th her left side but symptoms continued. She states spasms spontaneously resolved. She does report missing several doses (unsure exactly how long she had been out of medication) just prior to event. She also reports not taking levetiracetam for the past week due to not having refills. No other seizure concerns. She is tolerating medicaton well. She does have persistent aching pain of both hands. She feels it is related to arthritis. She uses a topical cream which helps.   She was diagnosed with sleep apnea last year. AHI 40/hr. She does not wish  to use CPAP therapy. She reports reading about sleep apnea and does not wish to start therapy. She is concerned that her husband "has gotten dependent" on his CPAP and she is adamant that she will not use it.   HISTORY: (copied from Dr Dohmeier's note on 03/03/2018)  Maureen [name] is an 80 year old female with a history of seizures and meningiomata. This patient is a left-handed Caucasian married female with a history of a left craniotomy for a left parasagittal meningioma resection on 01/04/1991.  Recent admission to hospital ( 02-22-2018)  for new onset atrial fibrillation, echo and cardioversion.  Medications were changed due to need for anticoagulation, and the patient placed on Keppra, Leviracetam.  She was weaned off Carbamazepine- completely .  She was only during her hs hospital stay placed on amiodarone,  this needed not  to be continued after succesfull cardioversion. She remains on TOPROL, feels tired.   She is here for her routine yearly seizure follow up , but needs a sleep study for atrial fib risk work up. Atrial hypertrophy.   She remains hoarse since intubation.    Interval history from 11/10/2016 I have the pleasure of seeing [name]. [name] today, meanwhile 80 years old, And followed in our practice for a remote history of seizures.Patient is tolerating Tegretol XL 4 years very well, she operates a motor vehicle without difficulty had no accidents. She denies any recent seizure events. The last possible seizure was 2002, years after her craniotomy. She reports  concern of recurrence of meningeomata. She had her last MRI 20 month ago. Reviewed today the MRI study from January 2017, which documented multiple small meningiomata. These measure in the millimeter range. They could however have grown in the meanwhile. I will repeat this brain MRI study in November or December of this year   08-03-2015 MM. Maureen [name] is a 80 year old female with a history of seizures and meningiomata. She  returns today for follow-up. She states that she is doing well. She continues to take Tegretol XL and tolerating it well. Denies any seizure events. She operates a Teacher, music without difficulty. Denies any changes with her gait or balance. Able to complete all ADLs independently. She returns today for an evaluation.   HISTORY 07/11/14 (MM): Maureen [name] is a 80 year old female with a history of seizures and meningioma. She returns today for follow-up. Patient is currently taken Tegretol XL and tolerating it well. She reports that she is not had a seizure since the last visit. She is able to complete all ADLs independently. She operates a Teacher, music without difficulty. Since the last visit she does notice a mild tremor in the left hand is intermittent. She denies any changes with her gait or balance. She continues to take calcium and vitamin D supplementation for osteopenia She returns today for an evaluation.   HISTORY 05/26/13 (CD):   Maureen [name] is a 80 y.o. female seen at [facility] here as a transfer of care from [facility]. She is a retired Tourist information centre manager , with a Scientist, water quality , taught elementary school.  Patient has been followed him as her primary care provider by Dr. Osvaldo Human, now Dr Serita Grammes.  This patient is a left-handed Caucasian married female with a history of a left craniotomy for a left parasagittal meningioma resection on 01/04/1991.  She has been followed by Dr. Morene Antu since July 1992 . Her last seizure is now over 10 years ago.   Preoperatively she was treated with Decadron and Phenobarbital after she developed a seizure , focal to the right body, this let to an evaluation and the discovery of the meningeoma. After the surgery she was started on Dilantin but developed a rash and became ataxic to this medication was discontinued. She continued for while to have abnormal sensations in her right leg ans sometimes right arm , which in most respects were identified a simple partial  seizures. Inspired Dr. Tressia Danas  last EEG was normal. She continued to the right foot and leg motor seizures that she describes as a spasm of the right foot.  Dr. Erling Cruz placed onTegretol since she had marked improvement. At the time of his last visit on 08/06/2009 the patient took brand nameTegretol-XR 200 mg twice a day she had a slight decrease in serum Na levels and 2008 underwent DEXA scan,  which was normal. She still reported about once a year  A symptom of leg spasm, no seizures per se. Possible  symptom of spasm beginning in the right  Foot and ankle up going to the right knee and then radiating downwards to the foot with a duration of less than a minute. A repeated DEXA scan revealed ion13 April 2014 newly diagnosed severe osteopenia. She begun taking vitamin D and calcium, recently in the last 6 month.     REVIEW OF SYSTEMS: Out of a complete 14 system review of symptoms, the patient complains only of the following symptoms, seizure, pain of both hands and all other reviewed systems are  negative.  ALLERGIES: Allergies  Allergen Reactions   Dilantin [Phenytoin Sodium Extended]      Stupor, ataxia.    Hydrocodone Nausea And Vomiting    HOME MEDICATIONS: Outpatient Medications Prior to Visit  Medication Sig Dispense Refill   amiodarone (PACERONE) 200 MG tablet Take 1 tablet (200 mg total) by mouth daily. 30 tablet 6   apixaban (ELIQUIS) 5 MG TABS tablet TAKE 1 TABLET BY MOUTH TWICE DAILY (  REPLACES  WARFARIN) 180 tablet 1   Calcium-Magnesium-Zinc (CAL-MAG-ZINC PO) Take 1 tablet by mouth daily at 12 noon.     Cholecalciferol (VITAMIN D-3) 25 MCG (1000 UT) CAPS Take by mouth. Take 1 capsule daily     furosemide (LASIX) 40 MG tablet Take 1 tablet (40 mg total) by mouth daily. 30 tablet 3   metoprolol succinate (TOPROL-XL) 50 MG 24 hr tablet Take 50 mg by mouth daily.     Multiple Vitamin (MULTIVITAMIN WITH MINERALS) TABS tablet Take 1 tablet by mouth daily.     pravastatin (PRAVACHOL) 40  MG tablet Take 40 mg by mouth daily.      levETIRAcetam (KEPPRA) 500 MG tablet Take 1 tablet (500 mg total) by mouth 2 (two) times daily. 180 tablet 0   losartan (COZAAR) 50 MG tablet Take 1 tablet (50 mg total) by mouth daily. 90 tablet 3   No facility-administered medications prior to visit.    PAST MEDICAL HISTORY: Past Medical History:  Diagnosis Date   Atrial fibrillation (HCC)    Benign tumor of meninges (HCC)    Chronic combined systolic and diastolic CHF, NYHA class 2 and ACA/AHA stage C 01/2018   Exacerbated by A. fib; EF 35 to 40% with global HK.  Reduced cardiac output and index (3.2/1.9).   -Associated moderate pulmonary hypertension-(mean PA P 38 mmHg)   GERD (gastroesophageal reflux disease)    occ. in past   Headache(784.0)    HTN (hypertension), benign    pt. states no hypertension   NICM (nonischemic cardiomyopathy) ([place]) 01/2018   Echo with EF of 35 to 40%.  Global HK.  Mild to moderate MR.  Mild nonobstructive CAD by cath.  Cardiac output/index 3.2/1.9   Pneumonia    ? as child   PONV (postoperative nausea and vomiting)    Seizures (HCC)    positive for HBP    PAST SURGICAL HISTORY: Past Surgical History:  Procedure Laterality Date   CARDIOVERSION N/A 02/24/2018   Procedure: CARDIOVERSION;  Surgeon: Sueanne Margarita, MD;  Location: [place] ENDOSCOPY;  Service: Cardiovascular;  Laterality: N/A;   CATARACT EXTRACTION, BILATERAL Bilateral    CHOLECYSTECTOMY     COLONOSCOPY WITH PROPOFOL N/A 11/30/2012   Procedure: COLONOSCOPY WITH PROPOFOL;  Surgeon: Garlan Fair, MD;  Location: [facility]OS[facility];  Service: Endoscopy;  Laterality: N/A;   ESOPHAGOGASTRODUODENOSCOPY (EGD) WITH PROPOFOL N/A 11/30/2012   Procedure: ESOPHAGOGASTRODUODENOSCOPY (EGD) WITH PROPOFOL;  Surgeon: Garlan Fair, MD;  Location: [facility] ENDOSCOPY;  Service: Endoscopy;  Laterality: N/A;   ESOPHAGOGASTRODUODENOSCOPY (EGD) WITH PROPOFOL N/A 03/14/2015   Procedure: ESOPHAGOGASTRODUODENOSCOPY (EGD) WITH  PROPOFOL;  Surgeon: Arta Silence, MD;  Location: [facility] END[facility]COPY;  Service: Endoscopy;  Laterality: N/A;   EUS N/A 01/05/2013   Procedure: FULL UPPER ENDOSCOPIC ULTRASOUND (EUS) RADIAL;  Surgeon: Arta Silence, MD;  Location: [facility];  Service: Endoscopy;  Laterality: N/A;  EUS with possible FNA   EUS N/A 03/14/2015   Procedure: UPPER ENDOSCOPIC ULTRASOUND (EUS) RADIAL;  Surgeon: Arta Silence, MD;  Location: [facility] ENDOS[facility];  Service: Endoscopy;  Laterality: N/A;   EYE SURGERY Left    "few weeks ago scar tissue laser surgery"   FINE NEEDLE ASPIRATION N/A 01/05/2013   Procedure: FINE NEEDLE ASPIRATION (FNA) RADIAL;  Surgeon: Arta Silence, MD;  Location: [facility] ENDOS[facility];  Service: Endoscopy;  Laterality: N/A;   KNEE ARTHROSCOPY Left    MYELOMININGOCELE REPAIR  12/1990   RIGHT/LEFT HEART CATH AND CORONARY ANGIOGRAPHY N/A 02/22/2018   Procedure: RIGHT/LEFT HEART CATH AND CORONARY ANGIOGRAPHY;  Surgeon: Larey Dresser, MD;  Location: [facility]ASIVE [facility] LAB;;  Nonobstructive CAD.  Mild to moderate pulmonary hypertension (PA P 47/24 mmHg-mean 38 mmHg.  PCWP 22 mmHg with LVEDP 24 mmHg. CO/CI = 3.2/1.9   TEE WITHOUT CARDIOVERSION N/A 02/24/2018   Procedure: TRANSESOPHAGEAL ECHOCARDIOGRAM (TEE) - WITH DCCV;  Surgeon: Sueanne Margarita, MD;  Location: [facility] ENDOSCOPY;; EF 35-40%.  Global HK.  No R WMA. Mild AI, Mild-Mod MR. Severe LA dilation (no LAA thrombus).  Severely reduced RV function.  Mild RA dilation --successful DCCV   TRANSTHORACIC ECHOCARDIOGRAM  02/18/2018   EF 35 to 40%.  Unable to assess diastolic function because of A. fib.  No obvious regional wall motion abnormality.  Global hypokinesis.  Severe LA dilation.  Mild-moderate MR.  Mild AI without AS   TRANSTHORACIC ECHOCARDIOGRAM  11/2019   CHF with A. fib RVR:  EF 45 to 50%.  Mildly reduced function.  GR 3 DD with elevated LAP.  Estimated RVP/PAP 72 to 73 mmHg.  Moderate MR and TR.  Mild to moderate PR.  Suggested CVP 15 mmHg.  Interestingly, despite  the severe elevated pressures.  Both right and left atria were both normal size.   VAGINAL HYSTERECTOMY  1983    FAMILY HISTORY: Family History  Problem Relation Age of Onset   Dementia Father        lewy body dementia   Heart disease Other        PACEMAKER DEFIB.      SOCIAL HISTORY: Social History   Socioeconomic History   Marital status: Married    Spouse name: Jan   Number of children: 2   Years of education: Master's   Highest education level: Not on file  Occupational History   Occupation: retired    Comment: [employer] in [place]: RETIRED  Tobacco Use   Smoking status: Never   Smokeless tobacco: Never  Vaping Use   Vaping Use: Never used  Substance and Sexual Activity   Alcohol use: Yes    Alcohol/week: 0.0 standard drinks    Comment: once monthly   Drug use: No   Sexual activity: Not on file  Other Topics Concern   Not on file  Social History Narrative   She retired in 1995 from [employer]. Lives at home with her husband, Jan. They have a son age 31 and a  son age 16. Cafffeine once daily. a week and alcohol once a month. Denies tobacco and illicit drug use.    Patient is left handed.   Patient has a Master's degree.   Social Determinants of Health   Financial Resource Strain: Not on file  Food Insecurity: Not on file  Transportation Needs: Not on file  Physical Activity: Not on file  Stress: Not on file  Social Connections: Not on file  Intimate Partner Violence: Not on file      PHYSICAL EXAM  Vitals:   09/19/20 0930  BP: (!) 174/70  Pulse: (!) 56  Weight: 149  lb (67.6 kg)  Height: '5\' 1"'$  (1.549 m)    Body mass index is 28.15 kg/m.  Generalized: Well developed, in no acute distress  Cardiology: normal rate and rhythm, no murmur noted Respiratory: clear to auscultation bilaterally  Neurological examination  Mentation: Alert oriented to time, place, history taking. Follows all commands speech and  language fluent Cranial nerve II-XII: Pupils were equal round reactive to light. Extraocular movements were full, visual field were full on confrontational test. Facial sensation and strength were normal. Head turning and shoulder shrug  were normal and symmetric. Motor: The motor testing reveals 5 over 5 strength of all 4 extremities. Good symmetric motor tone is noted throughout.  Sensory: Sensory testing is intact to soft touch on all 4 extremities. No evidence of extinction is noted.  Coordination: Cerebellar testing reveals good finger-nose-finger and heel-to-shin bilaterally.  Gait and station: Gait is normal.   DIAGNOSTIC DATA (LABS, IMAGING, TESTING) - I reviewed patient records, labs, notes, testing and imaging myself where available.  No flowsheet data found.   Lab Results  Component Value Date   WBC 5.6 02/22/2020   HGB 12.5 02/22/2020   HCT 36.8 02/22/2020   MCV 85 02/22/2020   PLT 265 02/22/2020      Component Value Date/Time   NA 141 02/22/2020 0946   K 4.6 02/22/2020 0946   CL 102 02/22/2020 0946   CO2 29 02/22/2020 0946   GLUCOSE 116 (H) 02/22/2020 0946   GLUCOSE 137 (H) 12/12/2019 0454   BUN 20 02/22/2020 0946   CREATININE 0.98 02/22/2020 0946   CALCIUM 9.5 02/22/2020 0946   PROT 6.4 02/22/2020 0946   ALBUMIN 4.5 02/22/2020 0946   AST 27 02/22/2020 0946   ALT 32 02/22/2020 0946   ALKPHOS 62 02/22/2020 0946   BILITOT 0.5 02/22/2020 0946   GFRNONAA 54 (L) 02/22/2020 0946   GFRNONAA 39 (L) 12/12/2019 0454   GFRAA 63 02/22/2020 0946   Lab Results  Component Value Date   CHOL 194 02/22/2020   HDL 64 02/22/2020   LDLCALC 114 (H) 02/22/2020   TRIG 91 02/22/2020   CHOLHDL 3.0 02/22/2020   No results found for: HGBA1C No results found for: VITAMINB12 Lab Results  Component Value Date   TSH 1.573 12/10/2019       ASSESSMENT AND PLAN 81 y.o. year old female  has a past medical history of Atrial fibrillation ([place]), Benign tumor of meninges ([place]),  Chronic combined systolic and diastolic CHF, NYHA class 2 and ACA/AHA stage C (01/2018), GERD (gastroesophageal reflux disease), Headache(784.0), HTN (hypertension), benign, NICM (nonischemic cardiomyopathy) ([place]) (01/2018), Pneumonia, PONV (postoperative nausea and vomiting), and Seizures ([place]). here with     ICD-10-CM   1. Seizures ([place])  R56.9     2. Meningioma (HCC)  D32.9 Creatinine, Serum    MR BRAIN W WO CONTRAST    3. OSA (obstructive sleep apnea)  G47.33        Mrs Lundie reports that she is doing well.  She is tolerating levetiracetam 500 mg twice daily.  We will continue current therapy.  Seizure precautions reviewed.  Adequate hydration discussed.  I will order a repeat MRI for evaluation of meningiomas. She reports going to wrong imaging site last year and was never rescheduled. Last MRI in March 2020 did show enlargement of previous right and left frontal meningiomas and new right frontal lesion.  We have reviewed her sleep study results.  She is adamant that she does not wish to use CPAP therapy.  I have provided her with information for her review via AVS.  She is aware that she may call should she change her mind.  She will continue close follow-up with primary care for management of comorbidities.    Orders Placed This Encounter  Procedures   MR BRAIN W WO CONTRAST    Standing Status:   Future    Standing Expiration Date:   09/19/2021    Order Specific Question:   If indicated for the ordered procedure, I authorize the administration of contrast media per Radiology protocol    Answer:   Yes    Order Specific Question:   What is the patient's sedation requirement?    Answer:   No Sedation    Order Specific Question:   Does the patient have a pacemaker or implanted devices?    Answer:   No    Order Specific Question:   Preferred imaging location?    Answer:   External   Creatinine, Serum      Meds ordered this encounter  Medications   levETIRAcetam (KEPPRA) 500 MG  tablet    Sig: Take 1 tablet (500 mg total) by mouth 2 (two) times daily.    Dispense:  180 tablet    Refill:  3    Order Specific Question:   Supervising Provider    Answer:   Bess Harvest, FNP-C 09/19/2020, 10:50 AM [facility] [address] [place], [place] [address] [phone]

## 2020-09-19 ENCOUNTER — Ambulatory Visit: Payer: Medicare PPO | Admitting: Family Medicine

## 2020-09-19 ENCOUNTER — Other Ambulatory Visit: Payer: Self-pay

## 2020-09-19 ENCOUNTER — Encounter: Payer: Self-pay | Admitting: Family Medicine

## 2020-09-19 VITALS — BP 174/70 | HR 56 | Ht 61.0 in | Wt 149.0 lb

## 2020-09-19 DIAGNOSIS — R569 Unspecified convulsions: Secondary | ICD-10-CM

## 2020-09-19 DIAGNOSIS — D329 Benign neoplasm of meninges, unspecified: Secondary | ICD-10-CM

## 2020-09-19 DIAGNOSIS — G4733 Obstructive sleep apnea (adult) (pediatric): Secondary | ICD-10-CM

## 2020-09-19 MED ORDER — LEVETIRACETAM 500 MG PO TABS: 500.0000 mg | ORAL_TABLET | Freq: Two times a day (BID) | ORAL | 3 refills | Status: DC

## 2020-09-20 ENCOUNTER — Telehealth: Payer: Self-pay | Admitting: Family Medicine

## 2020-09-20 LAB — CREATININE, SERUM
Creatinine, Ser: 0.89 mg/dL (ref 0.57–1.00)
eGFR: 65 mL/min/{1.73_m2} (ref >59)

## 2020-09-20 NOTE — Telephone Encounter (Signed)
[employer] pending uploaded notes on the portal  

## 2020-09-25 NOTE — Telephone Encounter (Signed)
Maureen [name] Josem Kaufmann: [id number] (exp. 09/20/20 o 10/20/20) order sent to GI. They will reach out to the patient to schedule.

## 2020-10-03 ENCOUNTER — Other Ambulatory Visit: Payer: Medicare PPO

## 2020-11-02 ENCOUNTER — Telehealth: Payer: Self-pay | Admitting: Family Medicine

## 2020-11-02 ENCOUNTER — Telehealth: Payer: Self-pay | Admitting: Cardiology

## 2020-11-02 MED ORDER — LOSARTAN POTASSIUM 50 MG PO TABS: 50.0000 mg | ORAL_TABLET | Freq: Every day | ORAL | 3 refills | Status: DC

## 2020-11-02 NOTE — Telephone Encounter (Signed)
Pharmacy has been updated.

## 2020-11-02 NOTE — Telephone Encounter (Signed)
Pt called wanting her pharmacy information updated. Pt states she would like the [place] taken off her chart and have the [place] added in it's place.

## 2020-11-02 NOTE — Telephone Encounter (Signed)
*  STAT* If patient is at the pharmacy, call can be transferred to refill team.   1. Which medications need to be refilled? (please list name of each medication and dose if known)  losartan (COZAAR) 50 MG tablet  2. Which pharmacy/location (including street and city if local pharmacy) is medication to be sent to?  [place], [place]  3. Do they need a 30 day or 90 day supply?  90 day supply  Patient states she is completely out of medication.

## 2020-12-08 ENCOUNTER — Encounter (HOSPITAL_BASED_OUTPATIENT_CLINIC_OR_DEPARTMENT_OTHER): Payer: Self-pay

## 2020-12-08 ENCOUNTER — Other Ambulatory Visit: Payer: Self-pay

## 2020-12-08 ENCOUNTER — Emergency Department (HOSPITAL_BASED_OUTPATIENT_CLINIC_OR_DEPARTMENT_OTHER): Payer: Medicare PPO | Admitting: Radiology

## 2020-12-08 ENCOUNTER — Emergency Department (HOSPITAL_BASED_OUTPATIENT_CLINIC_OR_DEPARTMENT_OTHER)
Admission: EM | Admit: 2020-12-08 | Discharge: 2020-12-08 | Disposition: A | Discharge status: Home / Self Care | Payer: Medicare PPO | Attending: Emergency Medicine | Admitting: Emergency Medicine

## 2020-12-08 DIAGNOSIS — Z7901 Long term (current) use of anticoagulants: Secondary | ICD-10-CM | POA: Diagnosis not present

## 2020-12-08 DIAGNOSIS — I110 Hypertensive heart disease with heart failure: Secondary | ICD-10-CM | POA: Insufficient documentation

## 2020-12-08 DIAGNOSIS — Z79899 Other long term (current) drug therapy: Secondary | ICD-10-CM | POA: Diagnosis not present

## 2020-12-08 DIAGNOSIS — S6992XA Unspecified injury of left wrist, hand and finger(s), initial encounter: Secondary | ICD-10-CM | POA: Diagnosis present

## 2020-12-08 DIAGNOSIS — I5043 Acute on chronic combined systolic (congestive) and diastolic (congestive) heart failure: Secondary | ICD-10-CM | POA: Insufficient documentation

## 2020-12-08 DIAGNOSIS — S60212A Contusion of left wrist, initial encounter: Secondary | ICD-10-CM | POA: Insufficient documentation

## 2020-12-08 DIAGNOSIS — W228XXA Striking against or struck by other objects, initial encounter: Secondary | ICD-10-CM | POA: Diagnosis not present

## 2020-12-08 DIAGNOSIS — Z86011 Personal history of benign neoplasm of the brain: Secondary | ICD-10-CM | POA: Diagnosis not present

## 2020-12-08 DIAGNOSIS — I4891 Unspecified atrial fibrillation: Secondary | ICD-10-CM | POA: Insufficient documentation

## 2020-12-08 DIAGNOSIS — R600 Localized edema: Secondary | ICD-10-CM | POA: Diagnosis not present

## 2020-12-08 MED ORDER — ACETAMINOPHEN 325 MG PO TABS
650.0000 mg | ORAL_TABLET | Freq: Once | ORAL | Status: AC
  Administered 2020-12-08: 650 mg via ORAL
  Filled 2020-12-08: qty 2

## 2020-12-08 NOTE — ED Triage Notes (Signed)
She states she "bumped" her left hand yesterday. Today her post. Left hand is edematous and ecchymotic with a red raised, tender area at post. Left wrist. She is in no distress.

## 2020-12-08 NOTE — ED Provider Notes (Signed)
[facility] DE[facility] Provider Note   CSN: [id number] Arrival date & time: 12/08/20  0947     History Chief Complaint  Patient presents with   Hand Problem    Maureen [name] is a 80 y.o. female.  HPI 80 year old female presents with left hand injury.  She was at her cardiologist office and accidentally bumped it on a metal railing.  Ice was applied and she elevated it.  However now it is still hurting and there is a bump over her wrist and she was to make sure this is not broken.  No numbness. She is on eliquis.  Past Medical History:  Diagnosis Date   Atrial fibrillation (HCC)    Benign tumor of meninges (HCC)    Chronic combined systolic and diastolic CHF, NYHA class 2 and ACA/AHA stage C 01/2018   Exacerbated by A. fib; EF 35 to 40% with global HK.  Reduced cardiac output and index (3.2/1.9).   -Associated moderate pulmonary hypertension-(mean PA P 38 mmHg)   GERD (gastroesophageal reflux disease)    occ. in past   Headache(784.0)    HTN (hypertension), benign    pt. states no hypertension   NICM (nonischemic cardiomyopathy) ([place]) 01/2018   Echo with EF of 35 to 40%.  Global HK.  Mild to moderate MR.  Mild nonobstructive CAD by cath.  Cardiac output/index 3.2/1.9   Pneumonia    ? as child   PONV (postoperative nausea and vomiting)    Seizures (HCC)    positive for HBP    Patient Active Problem List   Diagnosis Date Noted   Acute on chronic combined systolic and diastolic CHF (congestive heart failure) ([place]) 12/10/2019   OSA (obstructive sleep apnea) 03/26/2018   Chronic combined systolic and diastolic heart failure ([place]) 03/11/2018   Normal coronary arteries 03/11/2018   NICM (nonischemic cardiomyopathy) ([place]) 03/11/2018   Long term (current) use of anticoagulants; CHA2DS2-VASc score of 8 03/01/2018   Paroxysmal atrial fibrillation ([place]): CHA2DS2-VASc score 8 02/17/2018   Epistaxis 09/17/2017   Partial symptomatic epilepsy with complex partial  seizures, not intractable, without status epilepticus ([place]) 11/10/2016   Incongruous diplopia 11/10/2016   Benign neoplasm of brain ([place]) 11/10/2016   Seizure disorder, focal motor ([place]) 05/26/2013   Medication monitoring encounter 05/26/2013   Meningioma ([place]) 05/26/2012   Seizures ([place]) 05/26/2012   Focal (motor) epilepsy ([place]) 05/26/2012   Essential hypertension     Past Surgical History:  Procedure Laterality Date   CARDIOVERSION N/A 02/24/2018   Procedure: CARDIOVERSION;  Surgeon: Sueanne Margarita, MD;  Location: [facility];  Service: Cardiovascular;  Laterality: N/A;   CATARACT EXTRACTION, BILATERAL Bilateral    CHOLECYSTECTOMY     COLONOSCOPY WITH PROPOFOL N/A 11/30/2012   Procedure: COLONOSCOPY WITH PROPOFOL;  Surgeon: Garlan Fair, MD;  Location: [facility];  Service: Endoscopy;  Laterality: N/A;   ESOPHAGOGASTRODUODENOSCOPY (EGD) WITH PROPOFOL N/A 11/30/2012   Procedure: ESOPHAGOGASTRODUODENOSCOPY (EGD) WITH PROPOFOL;  Surgeon: Garlan Fair, MD;  Location: [facility];  Service: Endoscopy;  Laterality: N/A;   ESOPHAGOGASTRODUODENOSCOPY (EGD) WITH PROPOFOL N/A 03/14/2015   Procedure: ESOPHAGOGASTRODUODENOSCOPY (EGD) WITH PROPOFOL;  Surgeon: Arta Silence, MD;  Location: [facility];  Service: Endoscopy;  Laterality: N/A;   EUS N/A 01/05/2013   Procedure: FULL UPPER ENDOSCOPIC ULTRASOUND (EUS) RADIAL;  Surgeon: Arta Silence, MD;  Location: [facility];  Service: Endoscopy;  Laterality: N/A;  EUS with possible FNA   EUS N/A 03/14/2015   Procedure: UPPER ENDOSCOPIC ULTRASOUND (EUS) RADIAL;  Surgeon:  Arta Silence, MD;  Location: [facility];  Service: Endoscopy;  Laterality: N/A;   EYE SURGERY Left    "few weeks ago scar tissue laser surgery"   FINE NEEDLE ASPIRATION N/A 01/05/2013   Procedure: FINE NEEDLE ASPIRATION (FNA) RADIAL;  Surgeon: Arta Silence, MD;  Location: [facility];  Service: Endoscopy;  Laterality: N/A;   KNEE ARTHROSCOPY Left     MYELOMININGOCELE REPAIR  12/1990   RIGHT/LEFT HEART CATH AND CORONARY ANGIOGRAPHY N/A 02/22/2018   Procedure: RIGHT/LEFT HEART CATH AND CORONARY ANGIOGRAPHY;  Surgeon: Larey Dresser, MD;  Location: [facility];;  Nonobstructive CAD.  Mild to moderate pulmonary hypertension (PA P 47/24 mmHg-mean 38 mmHg.  PCWP 22 mmHg with LVEDP 24 mmHg. CO/CI = 3.2/1.9   TEE WITHOUT CARDIOVERSION N/A 02/24/2018   Procedure: TRANSESOPHAGEAL ECHOCARDIOGRAM (TEE) - WITH DCCV;  Surgeon: Sueanne Margarita, MD;  Location: [facility];; EF 35-40%.  Global HK.  No R WMA. Mild AI, Mild-Mod MR. Severe LA dilation (no LAA thrombus).  Severely reduced RV function.  Mild RA dilation --successful DCCV   TRANSTHORACIC ECHOCARDIOGRAM  02/18/2018   EF 35 to 40%.  Unable to assess diastolic function because of A. fib.  No obvious regional wall motion abnormality.  Global hypokinesis.  Severe LA dilation.  Mild-moderate MR.  Mild AI without AS   TRANSTHORACIC ECHOCARDIOGRAM  11/2019   CHF with A. fib RVR:  EF 45 to 50%.  Mildly reduced function.  GR 3 DD with elevated LAP.  Estimated RVP/PAP 72 to 73 mmHg.  Moderate MR and TR.  Mild to moderate PR.  Suggested CVP 15 mmHg.  Interestingly, despite the severe elevated pressures.  Both right and left atria were both normal size.   VAGINAL HYSTERECTOMY  1983     OB History   No obstetric history on file.     Family History  Problem Relation Age of Onset   Dementia Father        lewy body dementia   Heart disease Other        PACEMAKER DEFIB.      Social History   Tobacco Use   Smoking status: Never   Smokeless tobacco: Never  Vaping Use   Vaping Use: Never used  Substance Use Topics   Alcohol use: Yes    Alcohol/week: 0.0 standard drinks    Comment: once monthly   Drug use: No    Home Medications Prior to Admission medications   Medication Sig Start Date End Date Taking? Authorizing Provider  amiodarone (PACERONE) 200 MG tablet Take 1 tablet (200 mg total) by  mouth daily. 12/30/19   Leonie Man, MD  apixaban (ELIQUIS) 5 MG TABS tablet TAKE 1 TABLET BY MOUTH TWICE DAILY (  REPLACES  WARFARIN) 08/08/20   Leonie Man, MD  Calcium-Magnesium-Zinc (CAL-MAG-ZINC PO) Take 1 tablet by mouth daily at 12 noon.    [provider]  Cholecalciferol (VITAMIN D-3) 25 MCG (1000 UT) CAPS Take by mouth. Take 1 capsule daily    [provider]  furosemide (LASIX) 40 MG tablet Take 1 tablet (40 mg total) by mouth daily. 12/12/19   Dwyane Dee, MD  levETIRAcetam (KEPPRA) 500 MG tablet Take 1 tablet (500 mg total) by mouth 2 (two) times daily. 09/19/20   Lomax, Amy, NP  losartan (COZAAR) 50 MG tablet Take 1 tablet (50 mg total) by mouth daily. 11/02/20 01/31/21  Leonie Man, MD  metoprolol succinate (TOPROL-XL) 50 MG 24 hr tablet Take 50  mg by mouth daily.    [provider]  Multiple Vitamin (MULTIVITAMIN WITH MINERALS) TABS tablet Take 1 tablet by mouth daily.    [provider]  pravastatin (PRAVACHOL) 40 MG tablet Take 40 mg by mouth daily.  05/05/13   [provider]    Allergies    Dilantin [phenytoin sodium extended] and Hydrocodone  Review of Systems   Review of Systems  Musculoskeletal:  Positive for arthralgias and joint swelling.  Skin:  Positive for color change.   Physical Exam Updated Vital Signs BP (!) 174/70 (BP Location: Right Arm)   Pulse 88   Temp 98.2 F (36.8 C) (Oral)   Resp 18   SpO2 96%   Physical Exam Vitals and nursing note reviewed.  Constitutional:      Appearance: She is well-developed.  HENT:     Head: Normocephalic and atraumatic.     Right Ear: External ear normal.     Left Ear: External ear normal.     Nose: Nose normal.  Eyes:     General:        Right eye: No discharge.        Left eye: No discharge.  Cardiovascular:     Rate and Rhythm: Normal rate and regular rhythm.     Pulses:          Radial pulses are 2+ on the left side.  Pulmonary:     Effort:  Pulmonary effort is normal.  Abdominal:     General: There is no distension.  Musculoskeletal:     Right wrist: Tenderness present. Normal range of motion.     Right hand: Swelling present. No tenderness.     Comments: There is diffuse dorsal ecchymosis to the hand going up to the fingers.  There is a small raised area/ecchymosis over her dorsal wrist that is tender.  Normal strength and sensation in the hand  Skin:    General: Skin is warm and dry.  Neurological:     Mental Status: She is alert.  Psychiatric:        Mood and Affect: Mood is not anxious.    ED Results / Procedures / Treatments   Labs (all labs ordered are listed, but only abnormal results are displayed) Labs Reviewed - No data to display  EKG None  Radiology DG Wrist Complete Left  Result Date: 12/08/2020 CLINICAL DATA:  Dorsal injury EXAM: LEFT WRIST - COMPLETE 3+ VIEW COMPARISON:  None. FINDINGS: There is no evidence of fracture or dislocation. There is no evidence of arthropathy or other focal bone abnormality. Soft tissue edema about the dorsum of the wrist. IMPRESSION: 1. No fracture or dislocation of the left wrist. The carpus is normally aligned. 2.  Soft tissue edema about the dorsum of the wrist. Electronically Signed   By: Delanna Ahmadi M.D.   On: 12/08/2020 11:59    Procedures Procedures   Medications Ordered in ED Medications  acetaminophen (TYLENOL) tablet 650 mg (650 mg Oral Given 12/08/20 1110)    ED Course  I have reviewed the triage vital signs and the nursing notes.  Pertinent labs & imaging results that were available during my care of the patient were reviewed by me and considered in my medical decision making (see chart for details).    MDM Rules/Calculators/A&P                          Patient's x-ray is unremarkable.  Likely  just a contusion.  There is no scaphoid tenderness on repeat exam to suggest an occult fracture.  Discussed supportive care with ice and Tylenol.  Final  Clinical Impression(s) / ED Diagnoses Final diagnoses:  Contusion of left wrist, initial encounter    Rx / DC Orders ED Discharge Orders     None        Sherwood Gambler, MD 12/08/20 1248

## 2020-12-18 DIAGNOSIS — T148XXA Other injury of unspecified body region, initial encounter: Secondary | ICD-10-CM | POA: Diagnosis not present

## 2020-12-20 ENCOUNTER — Encounter: Payer: Self-pay | Admitting: Neurology

## 2020-12-20 ENCOUNTER — Telehealth: Payer: Self-pay | Admitting: Family Medicine

## 2020-12-20 NOTE — Telephone Encounter (Signed)
Can you please contact patient and see if she was able to get MRI completed? I do not see where it was performed in Epic. She had an abnormal reading in 2020 and missed MRI ordered for follow up last year. TY!

## 2020-12-24 NOTE — Telephone Encounter (Signed)
Noted, sent a mychart message to the pt and she states that she has not gotten the MRI completed yet. Encouraged her to get it so we could continue to monitor. Asked pt to let us know if she is ready to scheduled and we can get the insurance authorization again.

## 2020-12-25 DIAGNOSIS — M7989 Other specified soft tissue disorders: Secondary | ICD-10-CM | POA: Diagnosis not present

## 2021-01-07 DIAGNOSIS — J069 Acute upper respiratory infection, unspecified: Secondary | ICD-10-CM | POA: Diagnosis not present

## 2021-01-08 ENCOUNTER — Emergency Department (HOSPITAL_BASED_OUTPATIENT_CLINIC_OR_DEPARTMENT_OTHER): Payer: Medicare PPO

## 2021-01-08 ENCOUNTER — Other Ambulatory Visit: Payer: Self-pay

## 2021-01-08 ENCOUNTER — Inpatient Hospital Stay (HOSPITAL_BASED_OUTPATIENT_CLINIC_OR_DEPARTMENT_OTHER)
Admission: EM | Admit: 2021-01-08 | Discharge: 2021-01-10 | DRG: 917 | Disposition: A | Discharge status: Home / Self Care | Payer: Medicare PPO | Attending: Internal Medicine | Admitting: Internal Medicine

## 2021-01-08 ENCOUNTER — Encounter (HOSPITAL_BASED_OUTPATIENT_CLINIC_OR_DEPARTMENT_OTHER): Payer: Self-pay

## 2021-01-08 DIAGNOSIS — R1310 Dysphagia, unspecified: Secondary | ICD-10-CM

## 2021-01-08 DIAGNOSIS — T391X1A Poisoning by 4-Aminophenol derivatives, accidental (unintentional), initial encounter: Principal | ICD-10-CM | POA: Diagnosis present

## 2021-01-08 DIAGNOSIS — I5043 Acute on chronic combined systolic (congestive) and diastolic (congestive) heart failure: Secondary | ICD-10-CM | POA: Diagnosis present

## 2021-01-08 DIAGNOSIS — I480 Paroxysmal atrial fibrillation: Secondary | ICD-10-CM | POA: Diagnosis not present

## 2021-01-08 DIAGNOSIS — I5031 Acute diastolic (congestive) heart failure: Secondary | ICD-10-CM | POA: Diagnosis not present

## 2021-01-08 DIAGNOSIS — K224 Dyskinesia of esophagus: Secondary | ICD-10-CM | POA: Diagnosis not present

## 2021-01-08 DIAGNOSIS — Z888 Allergy status to other drugs, medicaments and biological substances status: Secondary | ICD-10-CM

## 2021-01-08 DIAGNOSIS — G40909 Epilepsy, unspecified, not intractable, without status epilepticus: Secondary | ICD-10-CM

## 2021-01-08 DIAGNOSIS — I10 Essential (primary) hypertension: Secondary | ICD-10-CM | POA: Diagnosis present

## 2021-01-08 DIAGNOSIS — I110 Hypertensive heart disease with heart failure: Secondary | ICD-10-CM | POA: Diagnosis present

## 2021-01-08 DIAGNOSIS — J9601 Acute respiratory failure with hypoxia: Secondary | ICD-10-CM | POA: Diagnosis present

## 2021-01-08 DIAGNOSIS — Z86011 Personal history of benign neoplasm of the brain: Secondary | ICD-10-CM

## 2021-01-08 DIAGNOSIS — I5023 Acute on chronic systolic (congestive) heart failure: Secondary | ICD-10-CM | POA: Diagnosis present

## 2021-01-08 DIAGNOSIS — Z79899 Other long term (current) drug therapy: Secondary | ICD-10-CM | POA: Diagnosis not present

## 2021-01-08 DIAGNOSIS — Z885 Allergy status to narcotic agent status: Secondary | ICD-10-CM

## 2021-01-08 DIAGNOSIS — R569 Unspecified convulsions: Secondary | ICD-10-CM

## 2021-01-08 DIAGNOSIS — J811 Chronic pulmonary edema: Secondary | ICD-10-CM | POA: Diagnosis not present

## 2021-01-08 DIAGNOSIS — R519 Headache, unspecified: Secondary | ICD-10-CM | POA: Diagnosis not present

## 2021-01-08 DIAGNOSIS — E782 Mixed hyperlipidemia: Secondary | ICD-10-CM | POA: Diagnosis not present

## 2021-01-08 DIAGNOSIS — Z20822 Contact with and (suspected) exposure to covid-19: Secondary | ICD-10-CM | POA: Diagnosis present

## 2021-01-08 DIAGNOSIS — R779 Abnormality of plasma protein, unspecified: Secondary | ICD-10-CM | POA: Diagnosis not present

## 2021-01-08 DIAGNOSIS — I2510 Atherosclerotic heart disease of native coronary artery without angina pectoris: Secondary | ICD-10-CM | POA: Diagnosis present

## 2021-01-08 DIAGNOSIS — T501X5A Adverse effect of loop [high-ceiling] diuretics, initial encounter: Secondary | ICD-10-CM | POA: Diagnosis not present

## 2021-01-08 DIAGNOSIS — R7989 Other specified abnormal findings of blood chemistry: Secondary | ICD-10-CM | POA: Diagnosis not present

## 2021-01-08 DIAGNOSIS — E876 Hypokalemia: Secondary | ICD-10-CM | POA: Diagnosis not present

## 2021-01-08 DIAGNOSIS — Z9049 Acquired absence of other specified parts of digestive tract: Secondary | ICD-10-CM | POA: Diagnosis not present

## 2021-01-08 DIAGNOSIS — R441 Visual hallucinations: Secondary | ICD-10-CM | POA: Diagnosis not present

## 2021-01-08 DIAGNOSIS — G4733 Obstructive sleep apnea (adult) (pediatric): Secondary | ICD-10-CM | POA: Diagnosis present

## 2021-01-08 DIAGNOSIS — I517 Cardiomegaly: Secondary | ICD-10-CM | POA: Diagnosis not present

## 2021-01-08 DIAGNOSIS — D329 Benign neoplasm of meninges, unspecified: Secondary | ICD-10-CM | POA: Diagnosis not present

## 2021-01-08 DIAGNOSIS — Z7901 Long term (current) use of anticoagulants: Secondary | ICD-10-CM

## 2021-01-08 DIAGNOSIS — I272 Pulmonary hypertension, unspecified: Secondary | ICD-10-CM | POA: Diagnosis not present

## 2021-01-08 DIAGNOSIS — J101 Influenza due to other identified influenza virus with other respiratory manifestations: Secondary | ICD-10-CM | POA: Diagnosis not present

## 2021-01-08 DIAGNOSIS — I428 Other cardiomyopathies: Secondary | ICD-10-CM | POA: Diagnosis present

## 2021-01-08 DIAGNOSIS — R778 Other specified abnormalities of plasma proteins: Secondary | ICD-10-CM

## 2021-01-08 DIAGNOSIS — R748 Abnormal levels of other serum enzymes: Secondary | ICD-10-CM | POA: Diagnosis not present

## 2021-01-08 DIAGNOSIS — R278 Other lack of coordination: Secondary | ICD-10-CM | POA: Diagnosis not present

## 2021-01-08 DIAGNOSIS — R9431 Abnormal electrocardiogram [ECG] [EKG]: Secondary | ICD-10-CM | POA: Diagnosis not present

## 2021-01-08 DIAGNOSIS — R1031 Right lower quadrant pain: Secondary | ICD-10-CM | POA: Diagnosis not present

## 2021-01-08 LAB — CBC WITH DIFFERENTIAL/PLATELET
Abs Immature Granulocytes: 0.02 10*3/uL (ref 0.00–0.07)
Basophils Absolute: 0 10*3/uL (ref 0.0–0.1)
Basophils Relative: 0 %
Eosinophils Absolute: 0 10*3/uL (ref 0.0–0.5)
Eosinophils Relative: 0 %
HCT: 42.9 % (ref 36.0–46.0)
Hemoglobin: 13.5 g/dL (ref 12.0–15.0)
Immature Granulocytes: 0 %
Lymphocytes Relative: 16 %
Lymphs Abs: 0.8 10*3/uL (ref 0.7–4.0)
MCH: 27.1 pg (ref 26.0–34.0)
MCHC: 31.5 g/dL (ref 30.0–36.0)
MCV: 86 fL (ref 80.0–100.0)
Monocytes Absolute: 0.7 10*3/uL (ref 0.1–1.0)
Monocytes Relative: 15 %
Neutro Abs: 3.3 10*3/uL (ref 1.7–7.7)
Neutrophils Relative %: 69 %
Platelets: 253 10*3/uL (ref 150–400)
RBC: 4.99 MIL/uL (ref 3.87–5.11)
RDW: 14.6 % (ref 11.5–15.5)
WBC: 4.9 10*3/uL (ref 4.0–10.5)
nRBC: 0 % (ref 0.0–0.2)

## 2021-01-08 LAB — URINALYSIS, ROUTINE W REFLEX MICROSCOPIC
Bilirubin Urine: NEGATIVE
Glucose, UA: NEGATIVE mg/dL
Hgb urine dipstick: NEGATIVE
Ketones, ur: NEGATIVE mg/dL
Leukocytes,Ua: NEGATIVE
Nitrite: NEGATIVE
Specific Gravity, Urine: 1.01 (ref 1.005–1.030)
pH: 6 (ref 5.0–8.0)

## 2021-01-08 LAB — LIPASE, BLOOD: Lipase: 32 U/L (ref 11–51)

## 2021-01-08 LAB — COMPREHENSIVE METABOLIC PANEL
ALT: 318 U/L — ABNORMAL HIGH (ref 0–44)
ALT: 268 U/L — ABNORMAL HIGH (ref 0–44)
AST: 255 U/L — ABNORMAL HIGH (ref 15–41)
AST: 177 U/L — ABNORMAL HIGH (ref 15–41)
Albumin: 4.5 g/dL (ref 3.5–5.0)
Albumin: 3.9 g/dL (ref 3.5–5.0)
Alkaline Phosphatase: 59 U/L (ref 38–126)
Alkaline Phosphatase: 47 U/L (ref 38–126)
Anion gap: 9 (ref 5–15)
Anion gap: 10 (ref 5–15)
BUN: 16 mg/dL (ref 8–23)
BUN: 15 mg/dL (ref 8–23)
CO2: 30 mmol/L (ref 22–32)
CO2: 31 mmol/L (ref 22–32)
Calcium: 9.6 mg/dL (ref 8.9–10.3)
Calcium: 8.7 mg/dL — ABNORMAL LOW (ref 8.9–10.3)
Chloride: 99 mmol/L (ref 98–111)
Chloride: 99 mmol/L (ref 98–111)
Creatinine, Ser: 0.89 mg/dL (ref 0.44–1.00)
Creatinine, Ser: 0.8 mg/dL (ref 0.44–1.00)
GFR, Estimated: >60 mL/min (ref >60)
GFR, Estimated: >60 mL/min (ref >60)
Glucose, Bld: 100 mg/dL — ABNORMAL HIGH (ref 70–99)
Glucose, Bld: 141 mg/dL — ABNORMAL HIGH (ref 70–99)
Potassium: 3.2 mmol/L — ABNORMAL LOW (ref 3.5–5.1)
Potassium: 3.3 mmol/L — ABNORMAL LOW (ref 3.5–5.1)
Sodium: 138 mmol/L (ref 135–145)
Sodium: 140 mmol/L (ref 135–145)
Total Bilirubin: 1 mg/dL (ref 0.3–1.2)
Total Bilirubin: 0.7 mg/dL (ref 0.3–1.2)
Total Protein: 6.7 g/dL (ref 6.5–8.1)
Total Protein: 5.8 g/dL — ABNORMAL LOW (ref 6.5–8.1)

## 2021-01-08 LAB — BRAIN NATRIURETIC PEPTIDE: B Natriuretic Peptide: 529.5 pg/mL — ABNORMAL HIGH (ref 0.0–100.0)

## 2021-01-08 LAB — ACETAMINOPHEN LEVEL: Acetaminophen (Tylenol), Serum: <10 ug/mL — ABNORMAL LOW (ref 10–30)

## 2021-01-08 LAB — RESP PANEL BY RT-PCR (FLU A&B, COVID) ARPGX2
Influenza A by PCR: POSITIVE — AB
Influenza B by PCR: NEGATIVE
SARS Coronavirus 2 by RT PCR: NEGATIVE

## 2021-01-08 LAB — CK: Total CK: 103 U/L (ref 38–234)

## 2021-01-08 LAB — D-DIMER, QUANTITATIVE: D-Dimer, Quant: 0.54 ug/mL-FEU — ABNORMAL HIGH (ref 0.00–0.50)

## 2021-01-08 LAB — PROTIME-INR
INR: 1.3 — ABNORMAL HIGH (ref 0.8–1.2)
Prothrombin Time: 16.3 seconds — ABNORMAL HIGH (ref 11.4–15.2)

## 2021-01-08 LAB — TROPONIN I (HIGH SENSITIVITY)
Troponin I (High Sensitivity): 44 ng/L — ABNORMAL HIGH (ref <18)
Troponin I (High Sensitivity): 36 ng/L — ABNORMAL HIGH (ref <18)

## 2021-01-08 LAB — MAGNESIUM: Magnesium: 2 mg/dL (ref 1.7–2.4)

## 2021-01-08 MED ORDER — ACETYLCYSTEINE 200 MG/ML IV SOLN
INTRAVENOUS | Status: AC
  Filled 2021-01-08: qty 90

## 2021-01-08 MED ORDER — HYDRALAZINE HCL 25 MG PO TABS
25.0000 mg | ORAL_TABLET | Freq: Four times a day (QID) | ORAL | Status: DC | PRN
  Administered 2021-01-08: 22:00:00 25 mg via ORAL
  Filled 2021-01-08 (×2): qty 1

## 2021-01-08 MED ORDER — ACETAMINOPHEN 325 MG PO TABS
650.0000 mg | ORAL_TABLET | Freq: Once | ORAL | Status: AC
  Administered 2021-01-08: 14:00:00 650 mg via ORAL
  Filled 2021-01-08: qty 2

## 2021-01-08 MED ORDER — ACETYLCYSTEINE LOAD VIA INFUSION
150.0000 mg/kg | Freq: Once | INTRAVENOUS | Status: AC
  Administered 2021-01-08: 18:00:00 10140 mg via INTRAVENOUS
  Filled 2021-01-08: qty 333

## 2021-01-08 MED ORDER — MORPHINE SULFATE (PF) 2 MG/ML IV SOLN
2.0000 mg | Freq: Once | INTRAVENOUS | Status: AC
  Administered 2021-01-08: 20:00:00 2 mg via INTRAVENOUS
  Filled 2021-01-08: qty 1

## 2021-01-08 MED ORDER — ONDANSETRON HCL 4 MG/2ML IJ SOLN
4.0000 mg | Freq: Once | INTRAMUSCULAR | Status: AC
  Administered 2021-01-08: 20:00:00 4 mg via INTRAVENOUS
  Filled 2021-01-08: qty 2

## 2021-01-08 MED ORDER — DEXTROSE 5 % IV SOLN
15.0000 mg/kg/h | INTRAVENOUS | Status: DC
  Administered 2021-01-08 – 2021-01-09 (×2): 15 mg/kg/h via INTRAVENOUS
  Filled 2021-01-08 (×2): qty 90

## 2021-01-08 MED ORDER — MORPHINE SULFATE (PF) 2 MG/ML IV SOLN
2.0000 mg | Freq: Once | INTRAVENOUS | Status: AC
  Administered 2021-01-08: 23:00:00 2 mg via INTRAVENOUS
  Filled 2021-01-08: qty 1

## 2021-01-08 NOTE — ED Notes (Signed)
PA at Bedside.

## 2021-01-08 NOTE — ED Notes (Signed)
Care Handoff/Patient Report given to [facility], RN at [facility].

## 2021-01-08 NOTE — ED Provider Notes (Signed)
[facility]ERGENC[facility] DEPT Provider Note   CSN: [id number] Arrival date & time: 01/08/21  1233     History Chief Complaint  Patient presents with   Multiple Complaints    Maureen [name] is a 80 y.o. female with a history of seizures, meningioma, atrial fibrillation, hypertension, CHF.  Presents to the emergency department with a chief complaint of headache, shortness of breath, URI symptoms, abdominal pain.  Patient reports that her symptoms have been present over the last 5 days.  Patient describes headache as a "shooting pain," located to the left side of her head.  States that pain has been constant over the last 3 days.  Patient rates pain 8/10 on the pain scale.  Denies any aggravating factors.  States that pain has been improved when taking Tylenol.  Is concerned because she has a history of meningiomas.  Denies any visual disturbance, dysarthria, aphasia, numbness, weakness, neck pain, neck stiffness, syncope, seizures, dizziness.  No recent falls or injuries.  Patient has been taking her prescribed Keppra medication.  Patient endorses wheezing, nonproductive cough, rhinorrhea, and sore throat.  States that the symptoms have been present over the last 5 days.  Patient also endorses feeling of chest congestion.  Patient has had decreased appetite over this time.  Has had intermittent pain to right lower quadrant.  No pain at present.  Denies any fever, chills, nausea, vomiting, diarrhea, constipation, blood in stool, melena, dysuria, hematuria, urinary urgency.  No known sick contacts.  States that she took a home COVID-19 test which was negative.  Patient reports that she had chest pain, shortness of breath, and "strange feeling in my heart," on Saturday evening.  States that chest pain felt like a "pinch."  Symptoms lasted approximately 1 minute and then resolved.  Patient has not had any further episodes of this pain since then.      HPI     Past Medical History:   Diagnosis Date   Atrial fibrillation (HCC)    Benign tumor of meninges ([place])    Chronic combined systolic and diastolic CHF, NYHA class 2 and ACA/AHA stage C 01/2018   Exacerbated by A. fib; EF 35 to 40% with global HK.  Reduced cardiac output and index (3.2/1.9).   -Associated moderate pulmonary hypertension-(mean PA P 38 mmHg)   GERD (gastroesophageal reflux disease)    occ. in past   Headache(784.0)    HTN (hypertension), benign    pt. states no hypertension   NICM (nonischemic cardiomyopathy) ([place]) 01/2018   Echo with EF of 35 to 40%.  Global HK.  Mild to moderate MR.  Mild nonobstructive CAD by cath.  Cardiac output/index 3.2/1.9   Pneumonia    ? as child   PONV (postoperative nausea and vomiting)    Seizures (HCC)    positive for HBP    Patient Active Problem List   Diagnosis Date Noted   Acute on chronic combined systolic and diastolic CHF (congestive heart failure) ([place]) 12/10/2019   OSA (obstructive sleep apnea) 03/26/2018   Chronic combined systolic and diastolic heart failure ([place]) 03/11/2018   Normal coronary arteries 03/11/2018   NICM (nonischemic cardiomyopathy) ([place]) 03/11/2018   Long term (current) use of anticoagulants; CHA2DS2-VASc score of 8 03/01/2018   Paroxysmal atrial fibrillation ([place]): CHA2DS2-VASc score 8 02/17/2018   Epistaxis 09/17/2017   Partial symptomatic epilepsy with complex partial seizures, not intractable, without status epilepticus ([place]) 11/10/2016   Incongruous diplopia 11/10/2016   Benign neoplasm of brain ([place]) 11/10/2016  Seizure disorder, focal motor ([place]) 05/26/2013   Medication monitoring encounter 05/26/2013   Meningioma ([place]) 05/26/2012   Seizures ([place]) 05/26/2012   Focal (motor) epilepsy ([place]) 05/26/2012   Essential hypertension     Past Surgical History:  Procedure Laterality Date   CARDIOVERSION N/A 02/24/2018   Procedure: CARDIOVERSION;  Surgeon: Sueanne Margarita, MD;  Location: [facility];  Service: Cardiovascular;   Laterality: N/A;   CATARACT EXTRACTION, BILATERAL Bilateral    CHOLECYSTECTOMY     COLONOSCOPY WITH PROPOFOL N/A 11/30/2012   Procedure: COLONOSCOPY WITH PROPOFOL;  Surgeon: Garlan Fair, MD;  Location: [facility];  Service: Endoscopy;  Laterality: N/A;   ESOPHAGOGASTRODUODENOSCOPY (EGD) WITH PROPOFOL N/A 11/30/2012   Procedure: ESOPHAGOGASTRODUODENOSCOPY (EGD) WITH PROPOFOL;  Surgeon: Garlan Fair, MD;  Location: [facility];  Service: Endoscopy;  Laterality: N/A;   ESOPHAGOGASTRODUODENOSCOPY (EGD) WITH PROPOFOL N/A 03/14/2015   Procedure: ESOPHAGOGASTRODUODENOSCOPY (EGD) WITH PROPOFOL;  Surgeon: Arta Silence, MD;  Location: [facility];  Service: Endoscopy;  Laterality: N/A;   EUS N/A 01/05/2013   Procedure: FULL UPPER ENDOSCOPIC ULTRASOUND (EUS) RADIAL;  Surgeon: Arta Silence, MD;  Location: [facility];  Service: Endoscopy;  Laterality: N/A;  EUS with possible FNA   EUS N/A 03/14/2015   Procedure: UPPER ENDOSCOPIC ULTRASOUND (EUS) RADIAL;  Surgeon: Arta Silence, MD;  Location: [facility];  Service: Endoscopy;  Laterality: N/A;   EYE SURGERY Left    "few weeks ago scar tissue laser surgery"   FINE NEEDLE ASPIRATION N/A 01/05/2013   Procedure: FINE NEEDLE ASPIRATION (FNA) RADIAL;  Surgeon: Arta Silence, MD;  Location: [facility];  Service: Endoscopy;  Laterality: N/A;   KNEE ARTHROSCOPY Left    MYELOMININGOCELE REPAIR  12/1990   RIGHT/LEFT HEART CATH AND CORONARY ANGIOGRAPHY N/A 02/22/2018   Procedure: RIGHT/LEFT HEART CATH AND CORONARY ANGIOGRAPHY;  Surgeon: Larey Dresser, MD;  Location: [facility];;  Nonobstructive CAD.  Mild to moderate pulmonary hypertension (PA P 47/24 mmHg-mean 38 mmHg.  PCWP 22 mmHg with LVEDP 24 mmHg. CO/CI = 3.2/1.9   TEE WITHOUT CARDIOVERSION N/A 02/24/2018   Procedure: TRANSESOPHAGEAL ECHOCARDIOGRAM (TEE) - WITH DCCV;  Surgeon: Sueanne Margarita, MD;  Location: [facility];; EF 35-40%.  Global HK.  No R WMA. Mild AI, Mild-Mod MR. Severe  LA dilation (no LAA thrombus).  Severely reduced RV function.  Mild RA dilation --successful DCCV   TRANSTHORACIC ECHOCARDIOGRAM  02/18/2018   EF 35 to 40%.  Unable to assess diastolic function because of A. fib.  No obvious regional wall motion abnormality.  Global hypokinesis.  Severe LA dilation.  Mild-moderate MR.  Mild AI without AS   TRANSTHORACIC ECHOCARDIOGRAM  11/2019   CHF with A. fib RVR:  EF 45 to 50%.  Mildly reduced function.  GR 3 DD with elevated LAP.  Estimated RVP/PAP 72 to 73 mmHg.  Moderate MR and TR.  Mild to moderate PR.  Suggested CVP 15 mmHg.  Interestingly, despite the severe elevated pressures.  Both right and left atria were both normal size.   VAGINAL HYSTERECTOMY  1983     OB History   No obstetric history on file.     Family History  Problem Relation Age of Onset   Dementia Father        lewy body dementia   Heart disease Other        PACEMAKER DEFIB.      Social History   Tobacco Use   Smoking status: Never   Smokeless tobacco: Never  Vaping Use  Vaping Use: Never used  Substance Use Topics   Alcohol use: Not Currently    Comment: once monthly   Drug use: No    Home Medications Prior to Admission medications   Medication Sig Start Date End Date Taking? Authorizing Provider  amiodarone (PACERONE) 200 MG tablet Take 1 tablet (200 mg total) by mouth daily. 12/30/19   Leonie Man, MD  apixaban (ELIQUIS) 5 MG TABS tablet TAKE 1 TABLET BY MOUTH TWICE DAILY (  REPLACES  WARFARIN) 08/08/20   Leonie Man, MD  Calcium-Magnesium-Zinc (CAL-MAG-ZINC PO) Take 1 tablet by mouth daily at 12 noon.    [provider]  Cholecalciferol (VITAMIN D-3) 25 MCG (1000 UT) CAPS Take by mouth. Take 1 capsule daily    [provider]  furosemide (LASIX) 40 MG tablet Take 1 tablet (40 mg total) by mouth daily. 12/12/19   Dwyane Dee, MD  levETIRAcetam (KEPPRA) 500 MG tablet Take 1 tablet (500 mg total) by mouth 2 (two) times daily. 09/19/20    Lomax, Amy, NP  losartan (COZAAR) 50 MG tablet Take 1 tablet (50 mg total) by mouth daily. 11/02/20 01/31/21  Leonie Man, MD  metoprolol succinate (TOPROL-XL) 50 MG 24 hr tablet Take 50 mg by mouth daily.    [provider]  Multiple Vitamin (MULTIVITAMIN WITH MINERALS) TABS tablet Take 1 tablet by mouth daily.    [provider]  pravastatin (PRAVACHOL) 40 MG tablet Take 40 mg by mouth daily.  05/05/13   [provider]    Allergies    Dilantin [phenytoin sodium extended] and Hydrocodone  Review of Systems   Review of Systems  Constitutional:  Positive for appetite change (decreased). Negative for chills and fever.  HENT:  Positive for rhinorrhea, sore throat and trouble swallowing (x24months). Negative for congestion, drooling and voice change.   Eyes:  Negative for visual disturbance.  Respiratory:  Positive for cough and shortness of breath.   Cardiovascular:  Positive for chest pain and palpitations. Negative for leg swelling.  Gastrointestinal:  Positive for abdominal pain. Negative for abdominal distention, anal bleeding, blood in stool, constipation, diarrhea, nausea, rectal pain and vomiting.  Genitourinary:  Negative for difficulty urinating, dysuria, flank pain, hematuria, urgency, vaginal bleeding, vaginal discharge and vaginal pain.  Musculoskeletal:  Negative for back pain and neck pain.  Skin:  Negative for color change and rash.  Neurological:  Negative for dizziness, syncope, light-headedness and headaches.  Psychiatric/Behavioral:  Negative for confusion.    Physical Exam Updated Vital Signs BP (!) 170/80 (BP Location: Right Arm)    Pulse 65    Temp 98.2 F (36.8 C) (Oral)    Resp 16    Ht 5\' 1"  (1.549 m)    Wt 67.6 kg    SpO2 98%    BMI 28.16 kg/m   Physical Exam Vitals and nursing note reviewed.  Constitutional:      General: She is not in acute distress.    Appearance: She is not ill-appearing, toxic-appearing or diaphoretic.   HENT:     Head: Normocephalic.     Mouth/Throat:     Mouth: Mucous membranes are moist.     Tongue: No lesions. Tongue does not deviate from midline.     Palate: No mass and lesions.     Pharynx: Oropharynx is clear. Uvula midline. No pharyngeal swelling, oropharyngeal exudate, posterior oropharyngeal erythema or uvula swelling.     Tonsils: No tonsillar exudate or tonsillar abscesses. 0 on the right. 0  on the left.     Comments: Handles oral secretions without difficulty Eyes:     General: No scleral icterus.       Right eye: No discharge.        Left eye: No discharge.     Extraocular Movements: Extraocular movements intact.     Pupils: Pupils are equal, round, and reactive to light.  Cardiovascular:     Rate and Rhythm: Normal rate.  Pulmonary:     Effort: Pulmonary effort is normal. No tachypnea, bradypnea or respiratory distress.     Breath sounds: Normal breath sounds. No stridor. No wheezing, rhonchi or rales.  Abdominal:     General: Abdomen is flat. Bowel sounds are normal. There is no distension. There are no signs of injury.     Palpations: Abdomen is soft. There is no mass or pulsatile mass.     Tenderness: There is no abdominal tenderness. There is no right CVA tenderness, left CVA tenderness, guarding or rebound. Negative signs include McBurney's sign.     Hernia: There is no hernia in the umbilical area or ventral area.  Musculoskeletal:     Cervical back: Normal range of motion and neck supple. No rigidity.     Right lower leg: Normal. No swelling. No edema.     Left lower leg: Normal. No swelling. No edema.  Skin:    General: Skin is warm and dry.  Neurological:     General: No focal deficit present.     Mental Status: She is alert.     GCS: GCS eye subscore is 4. GCS verbal subscore is 5. GCS motor subscore is 6.     Cranial Nerves: No cranial nerve deficit or facial asymmetry.     Sensory: Sensation is intact.     Motor: No weakness, tremor, seizure activity  or pronator drift.     Coordination: Romberg sign negative. Finger-Nose-Finger Test normal.     Gait: Gait is intact. Gait normal.     Comments: CN II-XII intact, equal grip strength, +5 strength to bilateral upper and lower extremities, sensation to light touch intact to bilateral upper and lower extremities  Psychiatric:        Behavior: Behavior is cooperative.    ED Results / Procedures / Treatments   Labs (all labs ordered are listed, but only abnormal results are displayed) Labs Reviewed  RESP PANEL BY RT-PCR (FLU A&B, COVID) ARPGX2 - Abnormal; Notable for the following components:      Result Value   Influenza A by PCR POSITIVE (*)    All other components within normal limits  COMPREHENSIVE METABOLIC PANEL - Abnormal; Notable for the following components:   Potassium 3.2 (*)    Glucose, Bld 100 (*)    AST 255 (*)    ALT 318 (*)    All other components within normal limits  URINALYSIS, ROUTINE W REFLEX MICROSCOPIC - Abnormal; Notable for the following components:   Protein, ur TRACE (*)    All other components within normal limits  BRAIN NATRIURETIC PEPTIDE - Abnormal; Notable for the following components:   B Natriuretic Peptide 529.5 (*)    All other components within normal limits  ACETAMINOPHEN LEVEL - Abnormal; Notable for the following components:   Acetaminophen (Tylenol), Serum <10 (*)    All other components within normal limits  PROTIME-INR - Abnormal; Notable for the following components:   Prothrombin Time 16.3 (*)    INR 1.3 (*)    All other components within  normal limits  D-DIMER, QUANTITATIVE (NOT AT [facility]) - Abnormal; Notable for the following components:   D-Dimer, Quant 0.54 (*)    All other components within normal limits  TROPONIN I (HIGH SENSITIVITY) - Abnormal; Notable for the following components:   Troponin I (High Sensitivity) 44 (*)    All other components within normal limits  TROPONIN I (HIGH SENSITIVITY) - Abnormal; Notable for the following  components:   Troponin I (High Sensitivity) 36 (*)    All other components within normal limits  LIPASE, BLOOD  CBC WITH DIFFERENTIAL/PLATELET  MAGNESIUM  LEVETIRACETAM LEVEL    EKG EKG Interpretation  Date/Time:  Tuesday January 08 2021 13:09:32 EST Ventricular Rate:  70 PR Interval:  164 QRS Duration: 137 QT Interval:  475 QTC Calculation: 513 R Axis:   -72 Text Interpretation: Sinus rhythm Nonspecific IVCD with LAD Left ventricular hypertrophy Probable anterior infarct, age indeterminate Confirmed by Godfrey Pick [phone]) on 01/08/2021 1:26:39 PM  Radiology CT Head Wo Contrast  Result Date: 01/08/2021 CLINICAL DATA:  Headache, new or worsening (Age >= 50y) EXAM: CT HEAD WITHOUT CONTRAST TECHNIQUE: Contiguous axial images were obtained from the base of the skull through the vertex without intravenous contrast. COMPARISON:  MRI 04/05/2018 FINDINGS: Brain: No evidence of acute intracranial hemorrhage or extra-axial collection. Prior left parietal craniotomy with underlying encephalomalacia in the high convexity parietal lobe.There are multiple dural based masses consistent with meningiomas which are unchanged from prior MRI in March 2020. For reference: Parafalcine meningioma measures 6 mm, right frontal meningioma measures 7 mm, and left frontal meningioma measures 5 mm (coronal images 32, 49, and 47 respectively).The ventricles are normal in size.Scattered subcortical and periventricular white matter hypodensities, nonspecific but likely sequela of chronic small vessel ischemic disease. Vascular: Vascular calcifications.  No hyperdense vessel. Skull: Prior craniotomy as above.  No acute fracture. Sinuses/Orbits: No acute finding. Other: None. IMPRESSION: No acute intracranial abnormality. Prior craniotomy with underlying chronic encephalomalacia in the high convexity parietal lobe. Multiple subcentimeter meningiomas which are unchanged from prior MRI in March 2020. Electronically Signed   By:  Maurine Simmering M.D.   On: 01/08/2021 14:17   DG Chest Portable 1 View  Result Date: 01/08/2021 CLINICAL DATA:  Shortness of breath EXAM: PORTABLE CHEST 1 VIEW COMPARISON:  Chest x-ray 02/17/2018 FINDINGS: Heart is enlarged. Mediastinum appears grossly stable. No focal consolidation identified. Mild pulmonary vascular prominence. No pleural effusion or pneumothorax identified. IMPRESSION: Cardiomegaly and mild pulmonary vascular congestion. Electronically Signed   By: Ofilia Neas M.D.   On: 01/08/2021 13:23    Procedures .Critical Care Performed by: Loni Beckwith, PA-C Authorized by: Loni Beckwith, PA-C   Critical care provider statement:    Critical care time (minutes):  30   Critical care was necessary to treat or prevent imminent or life-threatening deterioration of the following conditions:  Toxidrome   Critical care was time spent personally by me on the following activities:  Development of treatment plan with patient or surrogate, discussions with consultants, evaluation of patient's response to treatment, examination of patient, ordering and review of laboratory studies, ordering and review of radiographic studies, ordering and performing treatments and interventions, pulse oximetry, re-evaluation of patient's condition and review of old charts   Care discussed with: admitting provider   Comments:     Accidental Tylenol overdose requiring administration of NAC   Medications Ordered in ED Medications  acetaminophen (TYLENOL) tablet 650 mg (650 mg Oral Given 01/08/21 1346)    ED Course  I  have reviewed the triage vital signs and the nursing notes.  Pertinent labs & imaging results that were available during my care of the patient were reviewed by me and considered in my medical decision making (see chart for details).  Clinical Course as of 01/08/21 1926  Tue Jan 08, 2021  1711 Spoke to poison control who advised that due to patient's increased AST and ALT in  setting of likely supratherapeutic Tylenol doses to start patient on NAC therapy.  Advised that patient will need to have liver function reevaluated in 22 hours. [PB]  9675 Spoke to Dr. Roosevelt Locks who will see the patient for admission. [PB]    Clinical Course User Index [PB] Dyann Ruddle   MDM Rules/Calculators/A&P                          Alert 80 year old female no acute distress, nontoxic-appearing.  Presents with multiple complaints.  Patient is neuro exam is reassuring.  Due to history of meningiomas and new headache will obtain noncontrast head CT to evaluate for possible intracranial abnormality.  We will give patient Tylenol for her headache.  Patient was offered migraine cocktail however defers due to sedate of effects of medications.  The patient report of chest pain and shortness of breath on Saturday will obtain EKG and troponin.  Patient has CAD history of CHF and therefore we will obtain BMP to evaluate for fluid overload status.  Patient endorses URI-like symptoms.  Will obtain COVID-19 testing.  Patient reports intermittent right lower quadrant abdominal pain.  On exam abdomen soft, nondistended, nontender.  Will obtain CMP, lipase, urinalysis and CBC.  CMP shows elevated AST and ALT.  No previous elevated liver functions noted.  Patient reports that she has been taking Tylenol approximately 3 times daily, and 1-2 times at night.  Patient also reports that she has been taking NyQuil.  Patient states that she normally takes 1 pill however sometimes will take 2 pills of Tylenol at once.  In setting of increased AST and ALT concern for suprapubic Tylenol levels.  We will check Tylenol level and INR.  Tylenol level within normal limits.  INR slightly elevated.  Spoke to poison control who recommended initiating NAC therapy.  Troponin was initially elevated at 44, repeat troponin trending downwards at 36.  Patient has elevated BNP at 529.  Due to increased troponin and BNP  in setting of shortness of breath concern for possible PE.  D-dimer obtained within normal limits when age-adjusted.  Low sufficient for PE at this time.  Suspect patient's increased troponin may be due to demand ischemia versus episode of atrial fibrillation.  positive for influenza.  Due to patient's need for NAC therapy will consult to admit.  Spoke to Dr. Roosevelt Locks who agreed to accept the patient for admission.  Patient was discussed with and evaluated by Dr. Doren Custard.      Final Clinical Impression(s) / ED Diagnoses Final diagnoses:  Influenza A  Tylenol overdose, accidental or unintentional, initial encounter  Elevated troponin    Rx / DC Orders ED Discharge Orders     None        Dyann Ruddle 01/08/21 Kathyrn Drown    Godfrey Pick, MD 01/09/21 2204

## 2021-01-08 NOTE — ED Notes (Signed)
Patient placed on 3LNC. SPO2 decreased to 87% on RA shortly after IV Pain Medication given. RT aware. NAD Noted. Patient comfortable with [facility] within reach.

## 2021-01-08 NOTE — ED Notes (Signed)
Patient transported to CT at this Time. ?

## 2021-01-08 NOTE — ED Notes (Signed)
Pt was given her Tylenol PO. Pt got choked on her second pill and coughed the pill back up. Pt stated she has been doing this the past several months and thinks she has narrowing. Pt refused second Tylenol. Pt's PA was made aware.

## 2021-01-08 NOTE — ED Triage Notes (Signed)
Patient here POV from Home for Headache.  Patient states she has been having a Persistent Headache, SOB, and ABD Pain. Headache has been present for 2 days while other Symptoms have been present for 5 days.  No Fevers. Mild Productive Cough that is now Dry.  NAD Noted during Triage. No Neurological Deficits noted during Triage. A&Ox4. GCS 15.

## 2021-01-08 NOTE — ED Notes (Signed)
Patient returned form CT  

## 2021-01-08 NOTE — ED Notes (Signed)
Care Handoff/Patient Report given to [place], [place]. All Questions answered at this Time.

## 2021-01-08 NOTE — ED Notes (Signed)
Per Poison Control, repeat Liver Enzyme Panel to be analyzed approximately at 1600.

## 2021-01-08 NOTE — ED Notes (Signed)
[facility] at Bedside. 

## 2021-01-08 NOTE — ED Notes (Signed)
Patient feeling flushed at this Time. Pharmacist consulted and Patient is stable at this Time. Infusion will continue and Patient will be monitored for further changes.

## 2021-01-08 NOTE — ED Notes (Signed)
US at Bedside

## 2021-01-08 NOTE — ED Notes (Signed)
Family updated regarding Admission Status.

## 2021-01-08 NOTE — ED Notes (Signed)
Patient updated regarding Admission Status. Patient signed Transfer Consent Form willingly.

## 2021-01-08 NOTE — ED Notes (Signed)
Family Member, Son, at Bedside. Patient and Family updated regarding Admission.

## 2021-01-08 NOTE — ED Notes (Signed)
Patient ambulatory with Minimal Gait Difficulty to Restroom.

## 2021-01-09 ENCOUNTER — Encounter (HOSPITAL_COMMUNITY): Payer: Self-pay | Admitting: Internal Medicine

## 2021-01-09 ENCOUNTER — Inpatient Hospital Stay (HOSPITAL_COMMUNITY): Payer: Medicare PPO

## 2021-01-09 DIAGNOSIS — E876 Hypokalemia: Secondary | ICD-10-CM | POA: Diagnosis present

## 2021-01-09 DIAGNOSIS — R7989 Other specified abnormal findings of blood chemistry: Secondary | ICD-10-CM

## 2021-01-09 DIAGNOSIS — J101 Influenza due to other identified influenza virus with other respiratory manifestations: Secondary | ICD-10-CM | POA: Diagnosis present

## 2021-01-09 DIAGNOSIS — R1310 Dysphagia, unspecified: Secondary | ICD-10-CM

## 2021-01-09 DIAGNOSIS — I480 Paroxysmal atrial fibrillation: Secondary | ICD-10-CM

## 2021-01-09 DIAGNOSIS — E782 Mixed hyperlipidemia: Secondary | ICD-10-CM | POA: Diagnosis present

## 2021-01-09 DIAGNOSIS — G40909 Epilepsy, unspecified, not intractable, without status epilepticus: Secondary | ICD-10-CM

## 2021-01-09 DIAGNOSIS — I5031 Acute diastolic (congestive) heart failure: Secondary | ICD-10-CM

## 2021-01-09 DIAGNOSIS — I5043 Acute on chronic combined systolic (congestive) and diastolic (congestive) heart failure: Secondary | ICD-10-CM

## 2021-01-09 DIAGNOSIS — I10 Essential (primary) hypertension: Secondary | ICD-10-CM

## 2021-01-09 LAB — ECHOCARDIOGRAM COMPLETE
Area-P 1/2: 3.42 cm2
Height: 61 in
P 1/2 time: 398 msec
S' Lateral: 3 cm
Single Plane A4C EF: 58.2 %
Weight: 2270.4 oz

## 2021-01-09 LAB — COMPREHENSIVE METABOLIC PANEL
ALT: 256 U/L — ABNORMAL HIGH (ref 0–44)
AST: 153 U/L — ABNORMAL HIGH (ref 15–41)
Albumin: 3.3 g/dL — ABNORMAL LOW (ref 3.5–5.0)
Alkaline Phosphatase: 48 U/L (ref 38–126)
Anion gap: 9 (ref 5–15)
BUN: 13 mg/dL (ref 8–23)
CO2: 28 mmol/L (ref 22–32)
Calcium: 8.8 mg/dL — ABNORMAL LOW (ref 8.9–10.3)
Chloride: 99 mmol/L (ref 98–111)
Creatinine, Ser: 0.78 mg/dL (ref 0.44–1.00)
GFR, Estimated: >60 mL/min (ref >60)
Glucose, Bld: 118 mg/dL — ABNORMAL HIGH (ref 70–99)
Potassium: 3.6 mmol/L (ref 3.5–5.1)
Sodium: 136 mmol/L (ref 135–145)
Total Bilirubin: 1 mg/dL (ref 0.3–1.2)
Total Protein: 5.8 g/dL — ABNORMAL LOW (ref 6.5–8.1)

## 2021-01-09 LAB — CBC WITH DIFFERENTIAL/PLATELET
Abs Immature Granulocytes: 0.01 10*3/uL (ref 0.00–0.07)
Basophils Absolute: 0 10*3/uL (ref 0.0–0.1)
Basophils Relative: 0 %
Eosinophils Absolute: 0 10*3/uL (ref 0.0–0.5)
Eosinophils Relative: 0 %
HCT: 39.4 % (ref 36.0–46.0)
Hemoglobin: 12.6 g/dL (ref 12.0–15.0)
Immature Granulocytes: 0 %
Lymphocytes Relative: 15 %
Lymphs Abs: 0.7 10*3/uL (ref 0.7–4.0)
MCH: 27 pg (ref 26.0–34.0)
MCHC: 32 g/dL (ref 30.0–36.0)
MCV: 84.5 fL (ref 80.0–100.0)
Monocytes Absolute: 0.6 10*3/uL (ref 0.1–1.0)
Monocytes Relative: 12 %
Neutro Abs: 3.6 10*3/uL (ref 1.7–7.7)
Neutrophils Relative %: 73 %
Platelets: 226 10*3/uL (ref 150–400)
RBC: 4.66 MIL/uL (ref 3.87–5.11)
RDW: 14.4 % (ref 11.5–15.5)
WBC: 5 10*3/uL (ref 4.0–10.5)
nRBC: 0 % (ref 0.0–0.2)

## 2021-01-09 LAB — PROTIME-INR
INR: 1.3 — ABNORMAL HIGH (ref 0.8–1.2)
Prothrombin Time: 15.8 seconds — ABNORMAL HIGH (ref 11.4–15.2)

## 2021-01-09 LAB — HEPATITIS PANEL, ACUTE
HCV Ab: NONREACTIVE
Hep A IgM: NONREACTIVE
Hep B C IgM: NONREACTIVE
Hepatitis B Surface Ag: NONREACTIVE

## 2021-01-09 LAB — LEVETIRACETAM LEVEL: Levetiracetam Lvl: 17.7 ug/mL (ref 10.0–40.0)

## 2021-01-09 LAB — MAGNESIUM: Magnesium: 1.9 mg/dL (ref 1.7–2.4)

## 2021-01-09 MED ORDER — APIXABAN 5 MG PO TABS: 5.0000 mg | ORAL_TABLET | Freq: Two times a day (BID) | ORAL | Status: DC

## 2021-01-09 MED ORDER — THIAMINE HCL 100 MG PO TABS
100.0000 mg | ORAL_TABLET | Freq: Every day | ORAL | Status: DC
  Administered 2021-01-09: 17:00:00 100 mg via ORAL
  Filled 2021-01-09 (×2): qty 1

## 2021-01-09 MED ORDER — LORAZEPAM 1 MG PO TABS: 1.0000 mg | ORAL_TABLET | ORAL | Status: DC | PRN

## 2021-01-09 MED ORDER — IBUPROFEN 200 MG PO TABS: 400.0000 mg | ORAL_TABLET | Freq: Four times a day (QID) | ORAL | Status: DC | PRN

## 2021-01-09 MED ORDER — ONDANSETRON HCL 4 MG PO TABS: 4.0000 mg | ORAL_TABLET | Freq: Four times a day (QID) | ORAL | Status: DC | PRN

## 2021-01-09 MED ORDER — HYDRALAZINE HCL 20 MG/ML IJ SOLN
10.0000 mg | Freq: Four times a day (QID) | INTRAMUSCULAR | Status: DC | PRN
  Administered 2021-01-09: 06:00:00 10 mg via INTRAVENOUS
  Filled 2021-01-09: qty 1

## 2021-01-09 MED ORDER — LEVETIRACETAM 500 MG PO TABS
500.0000 mg | ORAL_TABLET | Freq: Two times a day (BID) | ORAL | Status: DC
  Administered 2021-01-09 – 2021-01-10 (×2): 500 mg via ORAL
  Filled 2021-01-09 (×2): qty 1

## 2021-01-09 MED ORDER — ALBUTEROL SULFATE (2.5 MG/3ML) 0.083% IN NEBU: 2.5000 mg | INHALATION_SOLUTION | RESPIRATORY_TRACT | Status: DC | PRN

## 2021-01-09 MED ORDER — METOPROLOL SUCCINATE ER 25 MG PO TB24
25.0000 mg | ORAL_TABLET | Freq: Every day | ORAL | Status: DC
  Administered 2021-01-09 – 2021-01-10 (×2): 25 mg via ORAL
  Filled 2021-01-09 (×2): qty 1

## 2021-01-09 MED ORDER — FOLIC ACID 1 MG PO TABS
1.0000 mg | ORAL_TABLET | Freq: Every day | ORAL | Status: DC
  Administered 2021-01-09 – 2021-01-10 (×2): 1 mg via ORAL
  Filled 2021-01-09 (×2): qty 1

## 2021-01-09 MED ORDER — ADULT MULTIVITAMIN W/MINERALS CH
1.0000 | ORAL_TABLET | Freq: Every day | ORAL | Status: DC
  Administered 2021-01-09 – 2021-01-10 (×2): 1 via ORAL
  Filled 2021-01-09 (×2): qty 1

## 2021-01-09 MED ORDER — METOPROLOL SUCCINATE ER 50 MG PO TB24: 50.0000 mg | ORAL_TABLET | Freq: Every day | ORAL | Status: DC

## 2021-01-09 MED ORDER — POLYETHYLENE GLYCOL 3350 17 G PO PACK: 17.0000 g | PACK | Freq: Every day | ORAL | Status: DC | PRN

## 2021-01-09 MED ORDER — BENZONATATE 100 MG PO CAPS: 100.0000 mg | ORAL_CAPSULE | Freq: Three times a day (TID) | ORAL | Status: DC | PRN

## 2021-01-09 MED ORDER — FUROSEMIDE 10 MG/ML IJ SOLN
40.0000 mg | Freq: Once | INTRAMUSCULAR | Status: AC
  Administered 2021-01-09: 05:00:00 40 mg via INTRAVENOUS
  Filled 2021-01-09: qty 4

## 2021-01-09 MED ORDER — ONDANSETRON HCL 4 MG/2ML IJ SOLN: 4.0000 mg | Freq: Four times a day (QID) | INTRAMUSCULAR | Status: DC | PRN

## 2021-01-09 MED ORDER — FUROSEMIDE 40 MG PO TABS
40.0000 mg | ORAL_TABLET | Freq: Every day | ORAL | Status: DC
  Administered 2021-01-09: 17:00:00 40 mg via ORAL
  Filled 2021-01-09: qty 1

## 2021-01-09 MED ORDER — THIAMINE HCL 100 MG/ML IJ SOLN
100.0000 mg | Freq: Every day | INTRAMUSCULAR | Status: DC
  Administered 2021-01-10: 12:00:00 100 mg via INTRAVENOUS
  Filled 2021-01-09: qty 2

## 2021-01-09 MED ORDER — LOSARTAN POTASSIUM 50 MG PO TABS
50.0000 mg | ORAL_TABLET | Freq: Every day | ORAL | Status: DC
  Administered 2021-01-09 – 2021-01-10 (×2): 50 mg via ORAL
  Filled 2021-01-09 (×2): qty 1

## 2021-01-09 MED ORDER — AMIODARONE HCL 200 MG PO TABS
200.0000 mg | ORAL_TABLET | Freq: Every day | ORAL | Status: DC
  Administered 2021-01-09 – 2021-01-10 (×2): 200 mg via ORAL
  Filled 2021-01-09 (×2): qty 1

## 2021-01-09 MED ORDER — LORAZEPAM 2 MG/ML IJ SOLN: 1.0000 mg | INTRAMUSCULAR | Status: DC | PRN

## 2021-01-09 NOTE — Assessment & Plan Note (Addendum)
·   Patient complaining of a 89-month history of progressively worsening bouts of dysphagia with either food or pills "getting stuck in my chest."  This is becoming particularly concerning for the patient who is also complaining of associated hoarseness of her voice (this part may just be due to the influenza)  Due to substantial recurrent symptoms of dysphagia in addition to concerns for acetaminophen induced liver injury we will place consultation for gastroenterology and determination can be made whether this should be pursued as an inpatient versus as an outpatient  We will keep n.p.o. for now  Last dose of Eliquis Am of 12/20

## 2021-01-09 NOTE — H&P (Addendum)
History and Physical    Maureen [name] YKD:[id number] DOB: [birth year] DOA: 01/08/2021  PCP: Mayra Neer, MD  Patient coming from: Home   Chief Complaint:  Chief Complaint  Patient presents with   Multiple Complaints     HPI:    80 year old female with past medical history of nonischemic cardiomyopathy with systolic and diastolic congestive heart failure (Echo 11/2019 EF 45-50% with G3DD), hypertension, seizure disorder, obstructive sleep apnea (not on CPAP), paroxysmal atrial fibrillation (S/P Cardioversion 02/2018), hyperlipidemia, meningioma who presented to [place] emergency department with complaints of headaches, shortness of breath and abdominal pain.  Patient explains that for the past 5 days she has been experiencing a constellation of symptoms including headaches, shortness of breath and abdominal pain.  Patient describes her headache as a "shooting pain" located on the left side of her head.  Pain has been ongoing for approximately the past 5 days and is intermittently severe.  Patient has been self treating this with Tylenol, DayQuil and NyQuil.  Patient is unable to quantify exactly how many doses she has consumed over the past several days. Patient complains of associated poor appetite over the span of time.  Patient denies recent travel, sick contacts or contact with known COVID-19 infection.  Patient is additionally complaining of increasing shortness of breath and nonproductive cough over the same span of time.  Patient denies associated chest pain but does complain of dysphagia when attempting to consume solids or pills stating that foods are frequently "getting stuck in my chest."     Patient symptoms continued to persist since that she eventually presented to [place] emergency department for evaluation.  Upon evaluation in the emergency department patient was noted to have substantial transaminitis in the setting of reported high doses of Tylenol  ingestion.  ER provider discussed case with poison control and it was recommended that patient be initiated on N-acetylcysteine infusion along with serial liver function tests.  The hospitalist group was then called and the patient was accepted for transfer to [facility] for continued medical care.  Review of Systems:   Review of Systems  Respiratory:  Positive for cough and shortness of breath.   Cardiovascular:  Positive for orthopnea.  All other systems reviewed and are negative.  Past Medical History:  Diagnosis Date   Atrial fibrillation (HCC)    Benign tumor of meninges (HCC)    Chronic combined systolic and diastolic CHF, NYHA class 2 and ACA/AHA stage C 01/2018   Exacerbated by A. fib; EF 35 to 40% with global HK.  Reduced cardiac output and index (3.2/1.9).   -Associated moderate pulmonary hypertension-(mean PA P 38 mmHg)   GERD (gastroesophageal reflux disease)    occ. in past   Headache(784.0)    HTN (hypertension), benign    pt. states no hypertension   NICM (nonischemic cardiomyopathy) ([place]) 01/2018   Echo with EF of 35 to 40%.  Global HK.  Mild to moderate MR.  Mild nonobstructive CAD by cath.  Cardiac output/index 3.2/1.9   Pneumonia    ? as child   PONV (postoperative nausea and vomiting)    Seizures (HCC)    positive for HBP    Past Surgical History:  Procedure Laterality Date   CARDIOVERSION N/A 02/24/2018   Procedure: CARDIOVERSION;  Surgeon: Sueanne Margarita, MD;  Location: [facility] ENDOSCOPY;  Service: Cardiovascular;  Laterality: N/A;   CATARACT EXTRACTION, BILATERAL Bilateral    CHOLECYSTECTOMY     COLONOSCOPY WITH PROPOFOL N/A 11/30/2012  Procedure: COLONOSCOPY WITH PROPOFOL;  Surgeon: Garlan Fair, MD;  Location: [facility] ENDOSCOPY;  Service: Endoscopy;  Laterality: N/A;   ESOPHAGOGASTRODUODENOSCOPY (EGD) WITH PROPOFOL N/A 11/30/2012   Procedure: ESOPHAGOGASTRODUODENOSCOPY (EGD) WITH PROPOFOL;  Surgeon: Garlan Fair, MD;  Location: [facility] ENDOSCOPY;   Service: Endoscopy;  Laterality: N/A;   ESOPHAGOGASTRODUODENOSCOPY (EGD) WITH PROPOFOL N/A 03/14/2015   Procedure: ESOPHAGOGASTRODUODENOSCOPY (EGD) WITH PROPOFOL;  Surgeon: Arta Silence, MD;  Location: [facility] ENDOSCOPY;  Service: Endoscopy;  Laterality: N/A;   EUS N/A 01/05/2013   Procedure: FULL UPPER ENDOSCOPIC ULTRASOUND (EUS) RADIAL;  Surgeon: Arta Silence, MD;  Location: [facility] ENDOSCOPY;  Service: Endoscopy;  Laterality: N/A;  EUS with possible FNA   EUS N/A 03/14/2015   Procedure: UPPER ENDOSCOPIC ULTRASOUND (EUS) RADIAL;  Surgeon: Arta Silence, MD;  Location: [facility] ENDOSCOPY;  Service: Endoscopy;  Laterality: N/A;   EYE SURGERY Left    "few weeks ago scar tissue laser surgery"   FINE NEEDLE ASPIRATION N/A 01/05/2013   Procedure: FINE NEEDLE ASPIRATION (FNA) RADIAL;  Surgeon: Arta Silence, MD;  Location: [facility] ENDOSCOPY;  Service: Endoscopy;  Laterality: N/A;   KNEE ARTHROSCOPY Left    MYELOMININGOCELE REPAIR  12/1990   RIGHT/LEFT HEART CATH AND CORONARY ANGIOGRAPHY N/A 02/22/2018   Procedure: RIGHT/LEFT HEART CATH AND CORONARY ANGIOGRAPHY;  Surgeon: Larey Dresser, MD;  Location: [facility] INVASIVE CV LAB;;  Nonobstructive CAD.  Mild to moderate pulmonary hypertension (PA P 47/24 mmHg-mean 38 mmHg.  PCWP 22 mmHg with LVEDP 24 mmHg. CO/CI = 3.2/1.9   TEE WITHOUT CARDIOVERSION N/A 02/24/2018   Procedure: TRANSESOPHAGEAL ECHOCARDIOGRAM (TEE) - WITH DCCV;  Surgeon: Sueanne Margarita, MD;  Location: [facility] ENDOSCOPY;; EF 35-40%.  Global HK.  No R WMA. Mild AI, Mild-Mod MR. Severe LA dilation (no LAA thrombus).  Severely reduced RV function.  Mild RA dilation --successful DCCV   TRANSTHORACIC ECHOCARDIOGRAM  02/18/2018   EF 35 to 40%.  Unable to assess diastolic function because of A. fib.  No obvious regional wall motion abnormality.  Global hypokinesis.  Severe LA dilation.  Mild-moderate MR.  Mild AI without AS   TRANSTHORACIC ECHOCARDIOGRAM  11/2019   CHF with A. fib RVR:  EF 45 to 50%.  Mildly reduced function.   GR 3 DD with elevated LAP.  Estimated RVP/PAP 72 to 73 mmHg.  Moderate MR and TR.  Mild to moderate PR.  Suggested CVP 15 mmHg.  Interestingly, despite the severe elevated pressures.  Both right and left atria were both normal size.   VAGINAL HYSTERECTOMY  1983     reports that she has never smoked. She has never used smokeless tobacco. She reports that she does not currently use alcohol. She reports that she does not use drugs.  Allergies  Allergen Reactions   Dilantin [Phenytoin Sodium Extended]      Stupor, ataxia.    Hydrocodone Nausea And Vomiting    Family History  Problem Relation Age of Onset   Dementia Father        lewy body dementia   Heart disease Other        PACEMAKER DEFIB.       Prior to Admission medications   Medication Sig Start Date End Date Taking? Authorizing Provider  amiodarone (PACERONE) 200 MG tablet Take 1 tablet (200 mg total) by mouth daily. 12/30/19   Leonie Man, MD  apixaban (ELIQUIS) 5 MG TABS tablet TAKE 1 TABLET BY MOUTH TWICE DAILY (  REPLACES  WARFARIN) 08/08/20   Leonie Man, MD  Calcium-Magnesium-Zinc (CAL-MAG-ZINC PO) Take 1 tablet by mouth daily at 12 noon.    [provider]  Cholecalciferol (VITAMIN D-3) 25 MCG (1000 UT) CAPS Take by mouth. Take 1 capsule daily    [provider]  furosemide (LASIX) 40 MG tablet Take 1 tablet (40 mg total) by mouth daily. 12/12/19   Dwyane Dee, MD  levETIRAcetam (KEPPRA) 500 MG tablet Take 1 tablet (500 mg total) by mouth 2 (two) times daily. 09/19/20   Lomax, Amy, NP  losartan (COZAAR) 50 MG tablet Take 1 tablet (50 mg total) by mouth daily. 11/02/20 01/31/21  Leonie Man, MD  metoprolol succinate (TOPROL-XL) 50 MG 24 hr tablet Take 50 mg by mouth daily.    [provider]  Multiple Vitamin (MULTIVITAMIN WITH MINERALS) TABS tablet Take 1 tablet by mouth daily.    [provider]  pravastatin (PRAVACHOL) 40 MG tablet Take 40 mg by mouth daily.  05/05/13    [provider]    Physical Exam: Vitals:   01/08/21 2028 01/08/21 2145 01/08/21 2329 01/09/21 0454  BP:  (!) 179/64 (!) 147/70 (!) 178/67  Pulse:  64 66 63  Resp:  15 18 19   Temp:   99.3 F (37.4 C) 98.5 F (36.9 C)  TempSrc:    Oral  SpO2: 97% 100% 100% 100%  Weight:   64.3 kg 64.4 kg  Height:   5\' 1"  (1.549 m)     Constitutional: Awake alert and oriented x3, no associated distress.   Skin: no rashes, no lesions, good skin turgor noted. Eyes: Pupils are equally reactive to light.  No evidence of scleral icterus or conjunctival pallor.  ENMT: Moist mucous membranes noted.  Posterior pharynx clear of any exudate or lesions.   Neck: normal, supple, no masses, no thyromegaly.  No evidence of jugular venous distension.   Respiratory: Notable bibasilar rales with intermittent mild expiratory wheezing scattered rhonchi bilaterally.  Increased respiratory effort without evidence of accessory muscle use. Cardiovascular: Regular rate and rhythm, no murmurs / rubs / gallops. No extremity edema. 2+ pedal pulses. No carotid bruits.  Chest:   Nontender without crepitus or deformity.   Back:   Nontender without crepitus or deformity. Abdomen: Mild epigastric tenderness and right upper quadrant tenderness.  Abdomen is soft however.  No evidence of intra-abdominal masses.  Positive bowel sounds noted in all quadrants.   Musculoskeletal: No joint deformity upper and lower extremities. Good ROM, no contractures. Normal muscle tone.  Neurologic: CN 2-12 grossly intact. Sensation intact.  Patient moving all 4 extremities spontaneously.  Patient is following all commands.  Patient is responsive to verbal stimuli.   Psychiatric: Patient exhibits normal mood with appropriate affect.  Patient seems to possess insight as to their current situation.     Labs on Admission: I have personally reviewed following labs and imaging studies -   CBC: Recent Labs  Lab 01/08/21 1305 01/09/21 0442  WBC  4.9 5.0  NEUTROABS 3.3 3.6  HGB 13.5 12.6  HCT 42.9 39.4  MCV 86.0 84.5  PLT 253 335   Basic Metabolic Panel: Recent Labs  Lab 01/08/21 1314 01/08/21 1315 01/08/21 2008  NA  --  138 140  K  --  3.2* 3.3*  CL  --  99 99  CO2  --  30 31  GLUCOSE  --  100* 141*  BUN  --  16 15  CREATININE  --  0.89 0.80  CALCIUM  --  9.6 8.7*  MG 2.0  --   --  GFR: Estimated Creatinine Clearance: 46.6 mL/min (by C-G formula based on SCr of 0.8 mg/dL). Liver Function Tests: Recent Labs  Lab 01/08/21 1315 01/08/21 2008  AST 255* 177*  ALT 318* 268*  ALKPHOS 59 47  BILITOT 1.0 0.7  PROT 6.7 5.8*  ALBUMIN 4.5 3.9   Recent Labs  Lab 01/08/21 1315  LIPASE 32   No results for input(s): AMMONIA in the last 168 hours. Coagulation Profile: Recent Labs  Lab 01/08/21 1449  INR 1.3*   Cardiac Enzymes: Recent Labs  Lab 01/08/21 1315  CKTOTAL 103   BNP (last 3 results) No results for input(s): PROBNP in the last 8760 hours. HbA1C: No results for input(s): HGBA1C in the last 72 hours. CBG: No results for input(s): GLUCAP in the last 168 hours. Lipid Profile: No results for input(s): CHOL, HDL, LDLCALC, TRIG, CHOLHDL, LDLDIRECT in the last 72 hours. Thyroid Function Tests: No results for input(s): TSH, T4TOTAL, FREET4, T3FREE, THYROIDAB in the last 72 hours. Anemia Panel: No results for input(s): VITAMINB12, FOLATE, FERRITIN, TIBC, IRON, RETICCTPCT in the last 72 hours. Urine analysis:    Component Value Date/Time   COLORURINE YELLOW 01/08/2021 1332   APPEARANCEUR CLEAR 01/08/2021 1332   LABSPEC 1.010 01/08/2021 1332   PHURINE 6.0 01/08/2021 1332   GLUCOSEU NEGATIVE 01/08/2021 1332   HGBUR NEGATIVE 01/08/2021 1332   BILIRUBINUR NEGATIVE 01/08/2021 1332   KETONESUR NEGATIVE 01/08/2021 1332   PROTEINUR TRACE (A) 01/08/2021 1332   NITRITE NEGATIVE 01/08/2021 1332   LEUKOCYTESUR NEGATIVE 01/08/2021 1332    Radiological Exams on Admission - Personally Reviewed: CT Head Wo  Contrast  Result Date: 01/08/2021 CLINICAL DATA:  Headache, new or worsening (Age >= 50y) EXAM: CT HEAD WITHOUT CONTRAST TECHNIQUE: Contiguous axial images were obtained from the base of the skull through the vertex without intravenous contrast. COMPARISON:  MRI 04/05/2018 FINDINGS: Brain: No evidence of acute intracranial hemorrhage or extra-axial collection. Prior left parietal craniotomy with underlying encephalomalacia in the high convexity parietal lobe.There are multiple dural based masses consistent with meningiomas which are unchanged from prior MRI in March 2020. For reference: Parafalcine meningioma measures 6 mm, right frontal meningioma measures 7 mm, and left frontal meningioma measures 5 mm (coronal images 32, 49, and 47 respectively).The ventricles are normal in size.Scattered subcortical and periventricular white matter hypodensities, nonspecific but likely sequela of chronic small vessel ischemic disease. Vascular: Vascular calcifications.  No hyperdense vessel. Skull: Prior craniotomy as above.  No acute fracture. Sinuses/Orbits: No acute finding. Other: None. IMPRESSION: No acute intracranial abnormality. Prior craniotomy with underlying chronic encephalomalacia in the high convexity parietal lobe. Multiple subcentimeter meningiomas which are unchanged from prior MRI in March 2020. Electronically Signed   By: Maurine Simmering M.D.   On: 01/08/2021 14:17   DG Chest Portable 1 View  Result Date: 01/08/2021 CLINICAL DATA:  Shortness of breath EXAM: PORTABLE CHEST 1 VIEW COMPARISON:  Chest x-ray 02/17/2018 FINDINGS: Heart is enlarged. Mediastinum appears grossly stable. No focal consolidation identified. Mild pulmonary vascular prominence. No pleural effusion or pneumothorax identified. IMPRESSION: Cardiomegaly and mild pulmonary vascular congestion. Electronically Signed   By: Ofilia Neas M.D.   On: 01/08/2021 13:23   US Abdomen Limited RUQ (LIVER/GB)  Result Date: 01/08/2021 CLINICAL  DATA:  Elevated liver function tests. Prior cholecystectomy. EXAM: ULTRASOUND ABDOMEN LIMITED RIGHT UPPER QUADRANT COMPARISON:  None. FINDINGS: Gallbladder: The gallbladder is surgically absent. Common bile duct: Diameter: 1.2 mm Liver: No focal lesion identified. Within normal limits in parenchymal echogenicity. Portal vein is patent  on color Doppler imaging with normal direction of blood flow towards the liver. Other: None. IMPRESSION: 1. Evidence of prior cholecystectomy. 2. Otherwise, unremarkable right upper quadrant ultrasound. Electronically Signed   By: Virgina Norfolk M.D.   On: 01/08/2021 18:49    EKG: Personally reviewed.  Rhythm is normal sinus rhythm with heart rate of 70 bpm.  No dynamic ST segment changes appreciated.  Assessment/Plan  * Abnormal LFTs Patient presenting with 5 days of frequent Tylenol use found to have elevated transaminases and right upper quadrant/epigastric discomfort Due to temporal nature of elevated transaminases along with taking either extra strength Tylenol, NyQuil or DayQuil on a regular basis there was clinical concern for unintentional Tylenol overdose and after discussion with poison control the emergency room provider has initiated N-acetylcysteine infusion Patient has been unable to quantify the amount that she has ingested and therefore making this diagnosis is difficult Right upper quadrant ultrasound unremarkable revealing no evidence of fatty liver or hepatobiliary disease.  Creatine kinase level unremarkable.  Patient denies any history of alcohol use or other illicit drug use Influenza could even be playing a role in the transaminitis Obtaining hepatitis panel Ceasing statin therapy Will obtain gastroenterology consultation for evaluation of this as well as patient's complaints of dysphagia  Acute on chronic combined systolic and diastolic CHF (congestive heart failure) (HCC) Complains of shortness of breath, pillow orthopnea in the setting of  elevated BNP and evidence of vascular congestion on chest x-ray Uncertain as to how much of patient's shortness of breath is due to volume overload versus influenza infection We will administer one-time dose of 40 mg of intravenous Lasix, reassess on day shift to determine if further intravenous Lasix is necessary Patient has known history of severe diastolic dysfunction as well as systolic dysfunction Echocardiogram ordered for the morning Strict input and output monitoring Monitoring renal function and electrolytes with serial chemistries   Influenza A Patient complaining of 5 days of cough shortness of breath congestion and headaches Patient reports home influenza testing was negative but here influenza a PCR testing is positive Patient is 5 days out from onset of symptoms and therefore Tamiflu has not been initiated PRN Bronchodilator therapy for shortness of breath PRN Antitussives for cough Supplemental oxygen for bouts of hypoxia   Dysphagia Patient complaining of a 14-month history of progressively worsening bouts of dysphagia with either food or pills "getting stuck in my chest." This is becoming particularly concerning for the patient who is also complaining of associated hoarseness of her voice (this part may just be due to the influenza) Due to substantial recurrent symptoms of dysphagia in addition to concerns for acetaminophen induced liver injury we will place consultation for gastroenterology and determination can be made whether this should be pursued as an inpatient versus as an outpatient We will keep n.p.o. for now Last dose of Eliquis Am of 12/20  Hypokalemia Reevaluating potassium and magnesium levels Replacing as necessary Monitoring potassium and magnesium levels with serial chemistries  Seizure disorder ([place]) Continue home regimen of Keppra  Paroxysmal atrial fibrillation ([place]): CHA2DS2-VASc score 8 Continue home regimen of amiodarone Patient typically takes  Eliquis (last dose AM of 12/20)  however we are holding this temporarily in case of potential EGD Monitoring patient on telemetry Currently in NSR and rate controlled.   Essential hypertension Resume patients home regimen of oral antihypertensives Titrate antihypertensive regimen as necessary to achieve adequate BP control PRN intravenous antihypertensives for excessively elevated blood pressure    Mixed hyperlipidemia Temporarily  withholding statin therapy      Code Status:  Full code  code status decision has been confirmed with: patient Family Communication: deferred   Status is: Inpatient  Remains inpatient appropriate because:   Hepatitis, thought to be secondary to Tylenol induced liver injury in the setting of influenza and possible acute congestive heart failure requiring intravenous diuretics, N-acetylcysteine infusion, expert consultation with gastroenterology and close clinical monitoring with serial blood testing        Vernelle Emerald MD [facility] Pager [phone]  If 7PM-7AM, please contact night-coverage [email or web address] Use universal [place] password for that web site. If you do not have the password, please call the hospital operator.  01/09/2021, 5:15 AM

## 2021-01-09 NOTE — Assessment & Plan Note (Signed)
·   Reevaluating potassium and magnesium levels  Replacing as necessary  Monitoring potassium and magnesium levels with serial chemistries

## 2021-01-09 NOTE — Assessment & Plan Note (Signed)
·   Continue home regimen of Keppra 

## 2021-01-09 NOTE — Progress Notes (Signed)
Brief same-day note  Patient is a 80 year old female with history of nonischemic cardiomyopathy with systolic/diastolic congestive heart failure, hypertension, seizure disorder, OSA not on CPAP, paroxysmal A. fib, hyperlipidemia, meningioma who presented here from [facility] ED.  Patient  initially presented there with complaints of headache, shortness of breath, abdominal pain. As per the report, she was having headache at home and she was taking more Tylenol frequently along with DayQuil, NyQuil.  She also endorsed a long history of dysphagia with feeling of food stuck on her chest.  On presentation her liver enzymes were found to be elevated.  Flu was found to be positive.  Patient was also started on N-acetylcysteine infusion for elevated liver enzymes suspected to be from Tylenol overdose. Patient seen and examined at the bedside this morning.  Hemodynamically stable during my evaluation.  She does not complain of any shortness of breath or cough this morning.  She denies any upper abdominal discomfort, nausea or vomiting.  Son was present at the bedside.  Hepatitis panel has been pending for elevated liver enzymes.  GI also consulted for elevated liver enzymes and also for chronic history of dysphagia.   I think we can discontinue N-acetylcysteine because her Tylenol level was unremarkable and her liver enzymes are improving.  Her liver enzymes are in elevated most likely secondary to flu.  Tamiflu not a started because her symptoms have been more than 5 days Patient takes Eliquis at home which is on hold for possibility of EGD. I had a long discussion with the son at the bedside about her management plan.

## 2021-01-09 NOTE — Progress Notes (Signed)
Poison control called to follow up on patient stated they will close out her chart.

## 2021-01-09 NOTE — Assessment & Plan Note (Signed)
·   Patient complaining of 5 days of cough shortness of breath congestion and headaches  Patient reports home influenza testing was negative but here influenza a PCR testing is positive  Patient is 5 days out from onset of symptoms and therefore Tamiflu has not been initiated  PRN Bronchodilator therapy for shortness of breath  PRN Antitussives for cough  Supplemental oxygen for bouts of hypoxia

## 2021-01-09 NOTE — Care Management (Addendum)
°  Transition of Care [facility]) Screening Note   Patient Details  Name: TAKEISHA CIANCI Date of Birth: 1940-09-06   [facility]) CM/SW Contact:    Carles Collet, RN Phone Number: 01/09/2021, 4:01 PM    Transition of Care Department [facility]) has reviewed patient . We will continue to monitor patient advancement through interdisciplinary progression rounds. If new patient transition needs arise, please place a TOC consult.

## 2021-01-09 NOTE — Assessment & Plan Note (Signed)
·   Complains of shortness of breath, pillow orthopnea in the setting of elevated BNP and evidence of vascular congestion on chest x-ray  Uncertain as to how much of patient's shortness of breath is due to volume overload versus influenza infection  We will administer one-time dose of 40 mg of intravenous Lasix, reassess on day shift to determine if further intravenous Lasix is necessary  Patient has known history of severe diastolic dysfunction as well as systolic dysfunction  Echocardiogram ordered for the morning  Strict input and output monitoring  Monitoring renal function and electrolytes with serial chemistries

## 2021-01-09 NOTE — Assessment & Plan Note (Signed)
·   Temporarily withholding statin therapy

## 2021-01-09 NOTE — Plan of Care (Signed)

## 2021-01-09 NOTE — Assessment & Plan Note (Signed)
.   Resume patients home regimen of oral antihypertensives . Titrate antihypertensive regimen as necessary to achieve adequate BP control . PRN intravenous antihypertensives for excessively elevated blood pressure   

## 2021-01-09 NOTE — Consult Note (Signed)
Referring Provider: Dr. Tawanna Solo Primary Care Physician:  Mayra Neer, MD Primary Gastroenterologist:  Althia Forts  Reason for Consultation:  Dysphagia; Elevated LFTs  HPI: Maureen [name] is a 80 y.o. female with multiple medical problems seen for a consult due to elevated LFTs and dysphagia. Patient recently diagnosed with the flu (Influenza A) and on droplet precautions for that. Initially treated for possible Tylenol overdose with N-Acetylcysteine but that possibility was not confirmed and NAC was stopped. Hepatitis panel negative. LFTs are improving. Dysphagia has been intermittent solid food dysphagia for a year that seems to be progressing. Can happen anytime during a meal and mainly happens with bread. Sometimes with meat if she takes too big a bite. Denies trouble swallowing liquids. Occasional heartburn. Eliquis on hold. Husband and granddaughter are in the room.   Past Medical History:  Diagnosis Date   Atrial fibrillation (HCC)    Benign tumor of meninges (HCC)    Chronic combined systolic and diastolic CHF, NYHA class 2 and ACA/AHA stage C 01/2018   Exacerbated by A. fib; EF 35 to 40% with global HK.  Reduced cardiac output and index (3.2/1.9).   -Associated moderate pulmonary hypertension-(mean PA P 38 mmHg)   GERD (gastroesophageal reflux disease)    occ. in past   Headache(784.0)    HTN (hypertension), benign    pt. states no hypertension   NICM (nonischemic cardiomyopathy) ([place]) 01/2018   Echo with EF of 35 to 40%.  Global HK.  Mild to moderate MR.  Mild nonobstructive CAD by cath.  Cardiac output/index 3.2/1.9   Pneumonia    ? as child   PONV (postoperative nausea and vomiting)    Seizures (HCC)    positive for HBP    Past Surgical History:  Procedure Laterality Date   CARDIOVERSION N/A 02/24/2018   Procedure: CARDIOVERSION;  Surgeon: Sueanne Margarita, MD;  Location: [facility];  Service: Cardiovascular;  Laterality: N/A;   CATARACT EXTRACTION, BILATERAL Bilateral     CHOLECYSTECTOMY     COLONOSCOPY WITH PROPOFOL N/A 11/30/2012   Procedure: COLONOSCOPY WITH PROPOFOL;  Surgeon: Garlan Fair, MD;  Location: [facility];  Service: Endoscopy;  Laterality: N/A;   ESOPHAGOGASTRODUODENOSCOPY (EGD) WITH PROPOFOL N/A 11/30/2012   Procedure: ESOPHAGOGASTRODUODENOSCOPY (EGD) WITH PROPOFOL;  Surgeon: Garlan Fair, MD;  Location: [facility];  Service: Endoscopy;  Laterality: N/A;   ESOPHAGOGASTRODUODENOSCOPY (EGD) WITH PROPOFOL N/A 03/14/2015   Procedure: ESOPHAGOGASTRODUODENOSCOPY (EGD) WITH PROPOFOL;  Surgeon: Arta Silence, MD;  Location: [facility];  Service: Endoscopy;  Laterality: N/A;   EUS N/A 01/05/2013   Procedure: FULL UPPER ENDOSCOPIC ULTRASOUND (EUS) RADIAL;  Surgeon: Arta Silence, MD;  Location: [facility];  Service: Endoscopy;  Laterality: N/A;  EUS with possible FNA   EUS N/A 03/14/2015   Procedure: UPPER ENDOSCOPIC ULTRASOUND (EUS) RADIAL;  Surgeon: Arta Silence, MD;  Location: [facility];  Service: Endoscopy;  Laterality: N/A;   EYE SURGERY Left    "few weeks ago scar tissue laser surgery"   FINE NEEDLE ASPIRATION N/A 01/05/2013   Procedure: FINE NEEDLE ASPIRATION (FNA) RADIAL;  Surgeon: Arta Silence, MD;  Location: [facility];  Service: Endoscopy;  Laterality: N/A;   KNEE ARTHROSCOPY Left    MYELOMININGOCELE REPAIR  12/1990   RIGHT/LEFT HEART CATH AND CORONARY ANGIOGRAPHY N/A 02/22/2018   Procedure: RIGHT/LEFT HEART CATH AND CORONARY ANGIOGRAPHY;  Surgeon: Larey Dresser, MD;  Location: [facility];;  Nonobstructive CAD.  Mild to moderate pulmonary hypertension (PA P 47/24 mmHg-mean 38 mmHg.  PCWP 22  mmHg with LVEDP 24 mmHg. CO/CI = 3.2/1.9   TEE WITHOUT CARDIOVERSION N/A 02/24/2018   Procedure: TRANSESOPHAGEAL ECHOCARDIOGRAM (TEE) - WITH DCCV;  Surgeon: Sueanne Margarita, MD;  Location: [facility];; EF 35-40%.  Global HK.  No R WMA. Mild AI, Mild-Mod MR. Severe LA dilation (no LAA thrombus).  Severely reduced RV function.   Mild RA dilation --successful DCCV   TRANSTHORACIC ECHOCARDIOGRAM  02/18/2018   EF 35 to 40%.  Unable to assess diastolic function because of A. fib.  No obvious regional wall motion abnormality.  Global hypokinesis.  Severe LA dilation.  Mild-moderate MR.  Mild AI without AS   TRANSTHORACIC ECHOCARDIOGRAM  11/2019   CHF with A. fib RVR:  EF 45 to 50%.  Mildly reduced function.  GR 3 DD with elevated LAP.  Estimated RVP/PAP 72 to 73 mmHg.  Moderate MR and TR.  Mild to moderate PR.  Suggested CVP 15 mmHg.  Interestingly, despite the severe elevated pressures.  Both right and left atria were both normal size.   VAGINAL HYSTERECTOMY  1983    Prior to Admission medications   Medication Sig Start Date End Date Taking? Authorizing Provider  amiodarone (PACERONE) 200 MG tablet Take 1 tablet (200 mg total) by mouth daily. 12/30/19  Yes Leonie Man, MD  apixaban (ELIQUIS) 5 MG TABS tablet TAKE 1 TABLET BY MOUTH TWICE DAILY (  REPLACES  WARFARIN) Patient taking differently: Take 5 mg by mouth 2 (two) times daily. 08/08/20  Yes Leonie Man, MD  Calcium-Magnesium-Zinc (CAL-MAG-ZINC PO) Take 1 tablet by mouth daily at 12 noon.   Yes [provider]  Cholecalciferol (VITAMIN D-3) 25 MCG (1000 UT) CAPS Take 1,000 Units by mouth daily. Take 1 capsule daily   Yes [provider]  furosemide (LASIX) 40 MG tablet Take 1 tablet (40 mg total) by mouth daily. Patient taking differently: Take 40 mg by mouth daily as needed for edema or fluid. 12/12/19  Yes Dwyane Dee, MD  levETIRAcetam (KEPPRA) 500 MG tablet Take 1 tablet (500 mg total) by mouth 2 (two) times daily. 09/19/20  Yes Lomax, Amy, NP  losartan (COZAAR) 50 MG tablet Take 1 tablet (50 mg total) by mouth daily. 11/02/20 01/31/21 Yes Leonie Man, MD  metoprolol succinate (TOPROL-XL) 25 MG 24 hr tablet Take 25 mg by mouth daily. 10/25/20  Yes [provider]  Multiple Vitamin (MULTIVITAMIN WITH MINERALS) TABS tablet Take  1 tablet by mouth daily.   Yes [provider]  pravastatin (PRAVACHOL) 40 MG tablet Take 40 mg by mouth daily.  05/05/13  Yes [provider]    Scheduled Meds:  amiodarone  200 mg Oral Daily   folic acid  1 mg Oral Daily   furosemide  40 mg Oral Daily   levETIRAcetam  500 mg Oral BID   losartan  50 mg Oral Daily   metoprolol succinate  25 mg Oral Daily   multivitamin with minerals  1 tablet Oral Daily   thiamine  100 mg Oral Daily   Or   thiamine  100 mg Intravenous Daily   Continuous Infusions: PRN Meds:.albuterol, benzonatate, hydrALAZINE, ibuprofen, ondansetron **OR** ondansetron (ZOFRAN) IV, polyethylene glycol  Allergies as of 01/08/2021 - Review Complete 01/08/2021  Allergen Reaction Noted   Dilantin [phenytoin sodium extended]  05/26/2012   Hydrocodone Nausea And Vomiting 03/12/2015    Family History  Problem Relation Age of Onset   Dementia Father        lewy body dementia  Heart disease Other        PACEMAKER DEFIB.      Social History   Socioeconomic History   Marital status: Married    Spouse name: Jan   Number of children: 2   Years of education: Master's   Highest education level: Not on file  Occupational History   Occupation: retired    Comment: [employer] in [place]: RETIRED  Tobacco Use   Smoking status: Never   Smokeless tobacco: Never  Vaping Use   Vaping Use: Never used  Substance and Sexual Activity   Alcohol use: Not Currently    Comment: once monthly   Drug use: No   Sexual activity: Not on file  Other Topics Concern   Not on file  Social History Narrative   She retired in 1995 from [employer]. Lives at home with her husband, Jan. They have a son age 47 and a  son age 30. Cafffeine once daily. a week and alcohol once a month. Denies tobacco and illicit drug use.    Patient is left handed.   Patient has a Master's degree.   Social Determinants of Health   Financial Resource  Strain: Not on file  Food Insecurity: Not on file  Transportation Needs: Not on file  Physical Activity: Not on file  Stress: Not on file  Social Connections: Not on file  Intimate Partner Violence: Not on file    Review of Systems: All negative except as stated above in HPI.  Physical Exam: Vital signs: Vitals:   01/09/21 1234 01/09/21 1648  BP:  (!) 142/65  Pulse:  85  Resp:    Temp: 99.4 F (37.4 C) 99.5 F (37.5 C)  SpO2:     Last BM Date: 01/08/21 General:   Lethargic, elderly, thin, no acute distress, pleasant  Head: normocephalic, atraumatic Eyes: anicteric sclera ENT: oropharynx clear Neck: supple, nontender Lungs:  Coarse breath sounds; No acute distress. Heart:  Regular rate and rhythm; no murmurs, clicks, rubs,  or gallops. Abdomen: soft, nontender, nondistended, +BS  Rectal:  Deferred Ext: no edema  GI:  Lab Results: Recent Labs    01/08/21 1305 01/09/21 0442  WBC 4.9 5.0  HGB 13.5 12.6  HCT 42.9 39.4  PLT 253 226   BMET Recent Labs    01/08/21 1315 01/08/21 2008 01/09/21 0442  NA 138 140 136  K 3.2* 3.3* 3.6  CL 99 99 99  CO2 30 31 28   GLUCOSE 100* 141* 118*  BUN 16 15 13   CREATININE 0.89 0.80 0.78  CALCIUM 9.6 8.7* 8.8*   LFT Recent Labs    01/09/21 0442  PROT 5.8*  ALBUMIN 3.3*  AST 153*  ALT 256*  ALKPHOS 48  BILITOT 1.0   PT/INR Recent Labs    01/08/21 1449 01/09/21 0442  LABPROT 16.3* 15.8*  INR 1.3* 1.3*     Studies/Results:  US Abdomen Limited RUQ (LIVER/GB)  Result Date: 01/08/2021 CLINICAL DATA:  Elevated liver function tests. Prior cholecystectomy. EXAM: ULTRASOUND ABDOMEN LIMITED RIGHT UPPER QUADRANT COMPARISON:  None. FINDINGS: Gallbladder: The gallbladder is surgically absent. Common bile duct: Diameter: 1.2 mm Liver: No focal lesion identified. Within normal limits in parenchymal echogenicity. Portal vein is patent on color Doppler imaging with normal direction of blood flow towards the liver. Other:  None. IMPRESSION: 1. Evidence of prior cholecystectomy. 2. Otherwise, unremarkable right upper quadrant ultrasound. Electronically Signed   By: Virgina Norfolk M.D.   On: 01/08/2021  18:49    Impression/Plan: Chronic intermittent solid food dysphagia likely due to esophageal dysmotility but an esophageal ring also possible. Doubt esophageal stricture or malignancy. NPO p MN. Barium swallow tomorrow next step. If no obstructive process seen then can be f/u as outpt. Hold off on EGD right now unless obstructive process seen on barium swallow. Also this dysphagia is chronic and with her current influenza that ideally should resolve before doing an EGD unless deemed urgent from barium swallow findings. Continue holding Eliquis pending barium swallow results. Elevated LFTs likely multi-factorial from meds and influenza. LFTs improving. Supportive care.     LOS: 1 day   Lear Ng  01/09/2021, 6:24 PM  Questions please call [phone]

## 2021-01-09 NOTE — Progress Notes (Signed)
°  Echocardiogram 2D Echocardiogram has been performed.  Johny Chess 01/09/2021, 11:06 AM

## 2021-01-09 NOTE — Assessment & Plan Note (Addendum)
·   Continue home regimen of amiodarone  Patient typically takes Eliquis (last dose AM of 12/20)  however we are holding this temporarily in case of potential EGD  Monitoring patient on telemetry  Currently in NSR and rate controlled.

## 2021-01-09 NOTE — Telephone Encounter (Signed)
Patient was recently admitted for intractable headache in setting of viral illness, tested positive for influenza. CT scan was unremarkable and showed meningiomas were unchanged from 2020 imaging. I do recommend she update MRI for full evaluation but will cancel orders for now. She may call if she changes her mind. TY!

## 2021-01-09 NOTE — Assessment & Plan Note (Addendum)
·   Patient presenting with 5 days of frequent Tylenol use found to have elevated transaminases and right upper quadrant/epigastric discomfort  Due to temporal nature of elevated transaminases along with taking either extra strength Tylenol, NyQuil or DayQuil on a regular basis there was clinical concern for unintentional Tylenol overdose and after discussion with poison control the emergency room provider has initiated N-acetylcysteine infusion  Patient has been unable to quantify the amount that she has ingested and therefore making this diagnosis is difficult  Right upper quadrant ultrasound unremarkable revealing no evidence of fatty liver or hepatobiliary disease.  Creatine kinase level unremarkable.  Patient denies any history of alcohol use or other illicit drug use  Influenza could even be playing a role in the transaminitis  Obtaining hepatitis panel  Ceasing statin therapy  Will obtain gastroenterology consultation for evaluation of this as well as patient's complaints of dysphagia

## 2021-01-10 ENCOUNTER — Inpatient Hospital Stay (HOSPITAL_COMMUNITY): Payer: Medicare PPO

## 2021-01-10 DIAGNOSIS — R778 Other specified abnormalities of plasma proteins: Secondary | ICD-10-CM

## 2021-01-10 LAB — COMPREHENSIVE METABOLIC PANEL
ALT: 198 U/L — ABNORMAL HIGH (ref 0–44)
AST: 92 U/L — ABNORMAL HIGH (ref 15–41)
Albumin: 3.4 g/dL — ABNORMAL LOW (ref 3.5–5.0)
Alkaline Phosphatase: 51 U/L (ref 38–126)
Anion gap: 10 (ref 5–15)
BUN: 16 mg/dL (ref 8–23)
CO2: 29 mmol/L (ref 22–32)
Calcium: 8.9 mg/dL (ref 8.9–10.3)
Chloride: 97 mmol/L — ABNORMAL LOW (ref 98–111)
Creatinine, Ser: 1.04 mg/dL — ABNORMAL HIGH (ref 0.44–1.00)
GFR, Estimated: 54 mL/min — ABNORMAL LOW (ref >60)
Glucose, Bld: 95 mg/dL (ref 70–99)
Potassium: 2.8 mmol/L — ABNORMAL LOW (ref 3.5–5.1)
Sodium: 136 mmol/L (ref 135–145)
Total Bilirubin: 0.8 mg/dL (ref 0.3–1.2)
Total Protein: 5.8 g/dL — ABNORMAL LOW (ref 6.5–8.1)

## 2021-01-10 LAB — POTASSIUM: Potassium: 3.2 mmol/L — ABNORMAL LOW (ref 3.5–5.1)

## 2021-01-10 MED ORDER — POTASSIUM CHLORIDE CRYS ER 20 MEQ PO TBCR
40.0000 meq | EXTENDED_RELEASE_TABLET | ORAL | Status: AC
  Administered 2021-01-10 (×2): 40 meq via ORAL
  Filled 2021-01-10 (×2): qty 2

## 2021-01-10 MED ORDER — POTASSIUM CHLORIDE CRYS ER 20 MEQ PO TBCR
40.0000 meq | EXTENDED_RELEASE_TABLET | Freq: Once | ORAL | Status: AC
  Administered 2021-01-10: 14:00:00 40 meq via ORAL
  Filled 2021-01-10: qty 2

## 2021-01-10 MED ORDER — FUROSEMIDE 10 MG/ML IJ SOLN
40.0000 mg | Freq: Once | INTRAMUSCULAR | Status: AC
  Administered 2021-01-10: 12:00:00 40 mg via INTRAVENOUS
  Filled 2021-01-10: qty 4

## 2021-01-10 NOTE — Discharge Instructions (Signed)
Follow with Mayra Neer, MD in 5-7 days  Please stop taking Acetaminophen (Tylenol)  Please get a complete blood count and chemistry panel checked by your Primary MD at your next visit, and again as instructed by your Primary MD. Please get your medications reviewed and adjusted by your Primary MD.  Please request your Primary MD to go over all Hospital Tests and Procedure/Radiological results at the follow up, please get all Hospital records sent to your Prim MD by signing hospital release before you go home.  In some cases, there will be blood work, cultures and biopsy results pending at the time of your discharge. Please request that your primary care M.D. goes through all the records of your hospital data and follows up on these results.  If you had Pneumonia of Lung problems at the Hospital: Please get a 2 view Chest X ray done in 6-8 weeks after hospital discharge or sooner if instructed by your Primary MD.  If you have Congestive Heart Failure: Please call your Cardiologist or Primary MD anytime you have any of the following symptoms:  1) 3 pound weight gain in 24 hours or 5 pounds in 1 week  2) shortness of breath, with or without a dry hacking cough  3) swelling in the hands, feet or stomach  4) if you have to sleep on extra pillows at night in order to breathe  Follow cardiac low salt diet and 1.5 lit/day fluid restriction.  If you have diabetes Accuchecks 4 times/day, Once in AM empty stomach and then before each meal. Log in all results and show them to your primary doctor at your next visit. If any glucose reading is under 80 or above 300 call your primary MD immediately.  If you have Seizure/Convulsions/Epilepsy: Please do not drive, operate heavy machinery, participate in activities at heights or participate in high speed sports until you have seen by Primary MD or a Neurologist and advised to do so again. Per [facility] statutes, patients with seizures are not  allowed to drive until they have been seizure-free for six months.  Use caution when using heavy equipment or power tools. Avoid working on ladders or at heights. Take showers instead of baths. Ensure the water temperature is not too high on the home water heater. Do not go swimming alone. Do not lock yourself in a room alone (i.e. bathroom). When caring for infants or small children, sit down when holding, feeding, or changing them to minimize risk of injury to the child in the event you have a seizure. Maintain good sleep hygiene. Avoid alcohol.   If you had Gastrointestinal Bleeding: Please ask your Primary MD to check a complete blood count within one week of discharge or at your next visit. Your endoscopic/colonoscopic biopsies that are pending at the time of discharge, will also need to followed by your Primary MD.  Get Medicines reviewed and adjusted. Please take all your medications with you for your next visit with your Primary MD  Please request your Primary MD to go over all hospital tests and procedure/radiological results at the follow up, please ask your Primary MD to get all Hospital records sent to his/her office.  If you experience worsening of your admission symptoms, develop shortness of breath, life threatening emergency, suicidal or homicidal thoughts you must seek medical attention immediately by calling 911 or calling your MD immediately  if symptoms less severe.  You must read complete instructions/literature along with all the possible adverse reactions/side effects  for all the Medicines you take and that have been prescribed to you. Take any new Medicines after you have completely understood and accpet all the possible adverse reactions/side effects.   Do not drive or operate heavy machinery when taking Pain medications.   Do not take more than prescribed Pain, Sleep and Anxiety Medications  Special Instructions: If you have smoked or chewed Tobacco  in the last 2 yrs  please stop smoking, stop any regular Alcohol  and or any Recreational drug use.  Wear Seat belts while driving.  Please note You were cared for by a hospitalist during your hospital stay. If you have any questions about your discharge medications or the care you received while you were in the hospital after you are discharged, you can call the unit and asked to speak with the hospitalist on call if the hospitalist that took care of you is not available. Once you are discharged, your primary care physician will handle any further medical issues. Please note that NO REFILLS for any discharge medications will be authorized once you are discharged, as it is imperative that you return to your primary care physician (or establish a relationship with a primary care physician if you do not have one) for your aftercare needs so that they can reassess your need for medications and monitor your lab values.  You can reach the hospitalist office at phone [phone] or fax [phone]   If you do not have a primary care physician, you can call [phone] for a physician referral.  Activity: As tolerated with Full fall precautions use walker/cane & assistance as needed    Diet: heart healthy  Disposition Home

## 2021-01-10 NOTE — Progress Notes (Signed)
Patient ID: Maureen [name], female   DOB: 1940/01/28, 80 y.o.   MRN: [id number]  Barium swallow showed esophageal dysmotility. No plans for inpt EGD. Start soft diet. Will have her f/u as outpt for further evaluation in near future. Will sign off. Call back if questions.

## 2021-01-10 NOTE — Discharge Summary (Signed)
Physician Discharge Summary  Maureen [name] BCW:[id number] DOB: [birth year] DOA: 01/08/2021  PCP: Mayra Neer, MD  Admit date: 01/08/2021 Discharge date: 01/10/2021  Admitted From: home Disposition:  home  Recommendations for Outpatient Follow-up:  Follow up with PCP in 1-2 weeks Please obtain BMP/CBC in one week  Home Health: none Equipment/Devices: none  Discharge Condition: stable CODE STATUS: Full code Diet recommendation: heart healthy  HPI: Per admitting MD, 80 year old female with past medical history of nonischemic cardiomyopathy with systolic and diastolic congestive heart failure (Echo 11/2019 EF 45-50% with G3DD), hypertension, seizure disorder, obstructive sleep apnea (not on CPAP), paroxysmal atrial fibrillation (S/P Cardioversion 02/2018), hyperlipidemia, meningioma who presented to [place] emergency department with complaints of headaches, shortness of breath and abdominal pain. Patient explains that for the past 5 days she has been experiencing a constellation of symptoms including headaches, shortness of breath and abdominal pain.  Patient describes her headache as a "shooting pain" located on the left side of her head.  Pain has been ongoing for approximately the past 5 days and is intermittently severe.  Patient has been self treating this with Tylenol, DayQuil and NyQuil.  Patient is unable to quantify exactly how many doses she has consumed over the past several days. Patient complains of associated poor appetite over the span of time.  Patient denies recent travel, sick contacts or contact with known COVID-19 infection. Patient is additionally complaining of increasing shortness of breath and nonproductive cough over the same span of time.  Patient denies associated chest pain but does complain of dysphagia when attempting to consume solids or pills stating that foods are frequently "getting stuck in my chest."  Patient symptoms continued to persist since that  she eventually presented to [place] emergency department for evaluation. Upon evaluation in the emergency department patient was noted to have substantial transaminitis in the setting of reported high doses of Tylenol ingestion.  ER provider discussed case with poison control and it was recommended that patient be initiated on N-acetylcysteine infusion along with serial liver function tests.  The hospitalist group was then called and the patient was accepted for transfer to [facility] for continued medical care.  Hospital Course / Discharge diagnoses: Principal problem Acute hypoxic respiratory failure due to acute on chronic combined systolic and diastolic CHF -patient was admitted to the hospital with hypoxia, requiring supplemental oxygen.  Chest x-ray on admission showed evidence of fluid overload.  2D echo was done which showed an EF of 03-88%, grade 2 diastolic dysfunction.  This is improved from last year when the EF was 45 to 50%.  She received Lasix x2, with significant improvement in her respiratory status, she is currently on room air able to ambulate in the hallway without any difficulties.  She appears euvolemic.  She will be discharged home in stable condition with oral Lasix.  Active problems Abnormal LFTs-patient presents with 5 days of frequent Tylenol use in the setting of influenza A.  Tylenol has been discontinued and LFTs are trending down.  She was briefly placed on NAC but there was no strong evidence for actual overdose.  Poison control was consulted.  She was advised to stop the acetaminophen on discharge Influenza A-this is been going on for the past week, currently improving.  Due to late presentation in the hospital did not receive Tamiflu. Dysphagia-this is been intermittent, happening over the last 6 months.  She has days when has no symptoms and days then food is getting  stuck in various places.  GI consulted, underwent a barium esophagogram which showed  dysmotility.  Discussed with Dr. Michail Sermon, no role for EGD and recommended conservative management as an outpatient. Hypokalemia-due to Lasix, repleted prior to discharge Seizure disorder-continue home Keppra Paroxysmal A. fib-continue amiodarone, Eliquis Essential hypertension-resume home medications Hyperlipidemia-resume home medications, she has tolerated statin in the past and anticipate with discontinuation of acetaminophen her LFTs to continue to gradually trend down   Discharge Instructions   Allergies as of 01/10/2021       Reactions   Dilantin [phenytoin Sodium Extended]     Stupor, ataxia.    Hydrocodone Nausea And Vomiting        Medication List     TAKE these medications    amiodarone 200 MG tablet Commonly known as: PACERONE Take 1 tablet (200 mg total) by mouth daily.   CAL-MAG-ZINC PO Take 1 tablet by mouth daily at 12 noon.   Eliquis 5 MG Tabs tablet Generic drug: apixaban TAKE 1 TABLET BY MOUTH TWICE DAILY (  REPLACES  WARFARIN) What changed: See the new instructions.   furosemide 40 MG tablet Commonly known as: LASIX Take 1 tablet (40 mg total) by mouth daily. What changed:  when to take this reasons to take this   levETIRAcetam 500 MG tablet Commonly known as: KEPPRA Take 1 tablet (500 mg total) by mouth 2 (two) times daily.   losartan 50 MG tablet Commonly known as: COZAAR Take 1 tablet (50 mg total) by mouth daily.   metoprolol succinate 25 MG 24 hr tablet Commonly known as: TOPROL-XL Take 25 mg by mouth daily.   multivitamin with minerals Tabs tablet Take 1 tablet by mouth daily.   pravastatin 40 MG tablet Commonly known as: PRAVACHOL Take 40 mg by mouth daily.   Vitamin D-3 25 MCG (1000 UT) Caps Take 1,000 Units by mouth daily. Take 1 capsule daily        Follow-up Information     Schedule an appointment as soon as possible for a visit  with Mayra Neer, MD.   Specialty: Family Medicine Contact information: [place].  [employer] [place] [address] [phone]         Go to  [facility].   Why: As needed, If symptoms worsen Contact information: [place] [address] [phone]                Consultations: GI  Procedures/Studies:  CT Head Wo Contrast  Result Date: 01/08/2021 CLINICAL DATA:  Headache, new or worsening (Age >= 50y) EXAM: CT HEAD WITHOUT CONTRAST TECHNIQUE: Contiguous axial images were obtained from the base of the skull through the vertex without intravenous contrast. COMPARISON:  MRI 04/05/2018 FINDINGS: Brain: No evidence of acute intracranial hemorrhage or extra-axial collection. Prior left parietal craniotomy with underlying encephalomalacia in the high convexity parietal lobe.There are multiple dural based masses consistent with meningiomas which are unchanged from prior MRI in March 2020. For reference: Parafalcine meningioma measures 6 mm, right frontal meningioma measures 7 mm, and left frontal meningioma measures 5 mm (coronal images 32, 49, and 47 respectively).The ventricles are normal in size.Scattered subcortical and periventricular white matter hypodensities, nonspecific but likely sequela of chronic small vessel ischemic disease. Vascular: Vascular calcifications.  No hyperdense vessel. Skull: Prior craniotomy as above.  No acute fracture. Sinuses/Orbits: No acute finding. Other: None. IMPRESSION: No acute intracranial abnormality. Prior craniotomy with underlying chronic encephalomalacia in the high convexity parietal lobe. Multiple  subcentimeter meningiomas which are unchanged from prior MRI in March 2020. Electronically Signed   By: Maurine Simmering M.D.   On: 01/08/2021 14:17   DG Chest Portable 1 View  Result Date: 01/08/2021 CLINICAL DATA:  Shortness of breath EXAM: PORTABLE CHEST 1 VIEW COMPARISON:  Chest x-ray 02/17/2018 FINDINGS: Heart is enlarged. Mediastinum appears grossly stable. No  focal consolidation identified. Mild pulmonary vascular prominence. No pleural effusion or pneumothorax identified. IMPRESSION: Cardiomegaly and mild pulmonary vascular congestion. Electronically Signed   By: Ofilia Neas M.D.   On: 01/08/2021 13:23   ECHOCARDIOGRAM COMPLETE  Result Date: 01/09/2021    ECHOCARDIOGRAM REPORT   Patient Name:   KAMERA DUBAS Date of Exam: 01/09/2021 Medical Rec #:  [id number]      Height:       61.0 in Accession #:    [id number]     Weight:       141.9 lb Date of Birth:  12/27/1940      BSA:          1.632 m Patient Age:    57 years       BP:           159/59 mmHg Patient Gender: F              HR:           76 bpm. Exam Location:  Inpatient Procedure: 2D Echo Indications:    acute diastolic chf  History:        Patient has prior history of Echocardiogram examinations, most                 recent 12/11/2019. Cardiomyopathy, Signs/Symptoms:Shortness of                 Breath; Risk Factors:Hypertension and Sleep Apnea.  Sonographer:    Johny Chess RDCS Referring Phys: [id number] [place]  1. Left ventricular ejection fraction, by estimation, is 60 to 65%. The left ventricle has normal function. The left ventricle has no regional wall motion abnormalities. Left ventricular diastolic parameters are consistent with Grade II diastolic dysfunction (pseudonormalization). Elevated left atrial pressure.  2. Right ventricular systolic function is normal. The right ventricular size is normal. Tricuspid regurgitation signal is inadequate for assessing PA pressure.  3. Left atrial size was mildly dilated.  4. The mitral valve is normal in structure. No evidence of mitral valve regurgitation. No evidence of mitral stenosis.  5. The aortic valve is tricuspid. Aortic valve regurgitation is mild. No aortic stenosis is present.  6. The inferior vena cava is normal in size with greater than 50% respiratory variability, suggesting right atrial pressure of 3 mmHg.  Comparison(s): A prior study was performed on 12/11/2019. Prior images reviewed side by side. The left ventricular function has improved. The left ventricular diastolic function has improved. The right ventricular systolic function has improved. Mitral insufficiency has resolved. FINDINGS  Left Ventricle: Left ventricular ejection fraction, by estimation, is 60 to 65%. The left ventricle has normal function. The left ventricle has no regional wall motion abnormalities. The left ventricular internal cavity size was normal in size. There is  no left ventricular hypertrophy. Left ventricular diastolic parameters are consistent with Grade II diastolic dysfunction (pseudonormalization). Elevated left atrial pressure. Right Ventricle: The right ventricular size is normal. No increase in right ventricular wall thickness. Right ventricular systolic function is normal. Tricuspid regurgitation signal is inadequate for assessing PA pressure. Left Atrium: Left atrial size was mildly dilated. Right  Atrium: Right atrial size was normal in size. Pericardium: There is no evidence of pericardial effusion. Mitral Valve: The mitral valve is normal in structure. No evidence of mitral valve regurgitation. No evidence of mitral valve stenosis. Tricuspid Valve: The tricuspid valve is normal in structure. Tricuspid valve regurgitation is not demonstrated. No evidence of tricuspid stenosis. Aortic Valve: The aortic valve is tricuspid. Aortic valve regurgitation is mild. Aortic regurgitation PHT measures 398 msec. No aortic stenosis is present. Pulmonic Valve: The pulmonic valve was normal in structure. Pulmonic valve regurgitation is mild. No evidence of pulmonic stenosis. Aorta: The aortic root is normal in size and structure. Venous: The inferior vena cava is normal in size with greater than 50% respiratory variability, suggesting right atrial pressure of 3 mmHg. IAS/Shunts: No atrial level shunt detected by color flow Doppler.  LEFT  VENTRICLE PLAX 2D LVIDd:         4.70 cm     Diastology LVIDs:         3.00 cm     LV e' medial:    6.09 cm/s LV PW:         1.10 cm     LV E/e' medial:  11.4 LV IVS:        1.10 cm     LV e' lateral:   6.42 cm/s LVOT diam:     1.90 cm     LV E/e' lateral: 10.9 LV SV:         45 LV SV Index:   27 LVOT Area:     2.84 cm  LV Volumes (MOD) LV vol d, MOD A4C: 58.9 ml LV vol s, MOD A4C: 24.6 ml LV SV MOD A4C:     58.9 ml RIGHT VENTRICLE            IVC RV S prime:     9.57 cm/s  IVC diam: 1.50 cm TAPSE (M-mode): 1.7 cm LEFT ATRIUM             Index        RIGHT ATRIUM           Index LA diam:        3.80 cm 2.33 cm/m   RA Area:     11.10 cm LA Vol (A2C):   57.7 ml 35.35 ml/m  RA Volume:   22.40 ml  13.72 ml/m LA Vol (A4C):   51.1 ml 31.30 ml/m LA Biplane Vol: 58.3 ml 35.71 ml/m  AORTIC VALVE LVOT Vmax:   87.30 cm/s LVOT Vmean:  53.400 cm/s LVOT VTI:    0.158 m AI PHT:      398 msec  AORTA Ao Root diam: 3.10 cm Ao Asc diam:  3.30 cm MITRAL VALVE MV Area (PHT): 3.42 cm    SHUNTS MV Decel Time: 222 msec    Systemic VTI:  0.16 m MV E velocity: 69.70 cm/s  Systemic Diam: 1.90 cm MV A velocity: 52.80 cm/s MV E/A ratio:  1.32 Mihai Croitoru MD Electronically signed by Sanda Klein MD Signature Date/Time: 01/09/2021/2:19:19 PM    Final    US Abdomen Limited RUQ (LIVER/GB)  Result Date: 01/08/2021 CLINICAL DATA:  Elevated liver function tests. Prior cholecystectomy. EXAM: ULTRASOUND ABDOMEN LIMITED RIGHT UPPER QUADRANT COMPARISON:  None. FINDINGS: Gallbladder: The gallbladder is surgically absent. Common bile duct: Diameter: 1.2 mm Liver: No focal lesion identified. Within normal limits in parenchymal echogenicity. Portal vein is patent on color Doppler imaging with normal direction of blood flow towards the liver. Other:  None. IMPRESSION: 1. Evidence of prior cholecystectomy. 2. Otherwise, unremarkable right upper quadrant ultrasound. Electronically Signed   By: Virgina Norfolk M.D.   On: 01/08/2021 18:49   DG  ESOPHAGUS W SINGLE CM (SOL OR THIN BA)  Result Date: 01/10/2021 CLINICAL DATA:  Dysphagia. Patient reports dysphagia with discomfort in mid chest when swallowing. EXAM: ESOPHOGRAM / BARIUM SWALLOW / BARIUM TABLET STUDY TECHNIQUE: Combined double contrast and single contrast examination performed using effervescent crystals, thick barium liquid, and thin barium liquid. The patient was observed with fluoroscopy swallowing a 13 mm barium sulphate tablet. FLUOROSCOPY TIME:  Fluoroscopy Time:  2 minutes, 12 seconds. Radiation Exposure Index (if provided by the fluoroscopic device): 13.40 mGy Number of Acquired Spot Images: 2 COMPARISON:  Esophagram 11/17/2012. FINDINGS: Fluoroscopic imaging was acquired by Candiss Norse, PA. A ventral osteophyte minimally indents the posterior aspect of the hypopharynx/upper cervical esophagus at the C5-C6 level. Elsewhere, the esophagus appears normal in caliber and smooth in contour without evidence of fixed stricture, mass or mucosal abnormality. Moderate intermittent esophageal dysmotility with tertiary contractions. No definite hiatal hernia. No gastroesophageal reflux was observed. The patient swallowed a 13 mm barium tablet, which freely passed into the stomach. IMPRESSION: A ventral osteophyte minimally indents the posterior aspect of the hypopharynx/upper cervical esophagus at the C5-C6 level. Moderate intermittent esophageal dysmotility with tertiary contractions. Otherwise unremarkable esophagram, as described. Electronically Signed   By: Kellie Simmering D.O.   On: 01/10/2021 11:57     Subjective: - no chest pain, shortness of breath, no abdominal pain, nausea or vomiting.   Discharge Exam: BP (!) 113/55    Pulse 68    Temp 98.2 F (36.8 C)    Resp 18    Ht 5\' 1"  (1.549 m)    Wt 62.2 kg    SpO2 95%    BMI 25.90 kg/m   General: Pt is alert, awake, not in acute distress Cardiovascular: RRR, S1/S2 +, no rubs, no gallops Respiratory: CTA bilaterally, no wheezing,  no rhonchi Abdominal: Soft, NT, ND, bowel sounds + Extremities: no edema, no cyanosis  The results of significant diagnostics from this hospitalization (including imaging, microbiology, ancillary and laboratory) are listed below for reference.     Microbiology: Recent Results (from the past 240 hour(s))  Resp Panel by RT-PCR (Flu A&B, Covid) Nasopharyngeal Swab     Status: Abnormal   Collection Time: 01/08/21  1:15 PM   Specimen: Nasopharyngeal Swab; Nasopharyngeal(NP) swabs in vial transport medium  Result Value Ref Range Status   SARS Coronavirus 2 by RT PCR NEGATIVE NEGATIVE Final    Comment: (NOTE) SARS-CoV-2 target nucleic acids are NOT DETECTED.  The SARS-CoV-2 RNA is generally detectable in upper respiratory specimens during the acute phase of infection. The lowest concentration of SARS-CoV-2 viral copies this assay can detect is 138 copies/mL. A negative result does not preclude SARS-Cov-2 infection and should not be used as the sole basis for treatment or other patient management decisions. A negative result may occur with  improper specimen collection/handling, submission of specimen other than nasopharyngeal swab, presence of viral mutation(s) within the areas targeted by this assay, and inadequate number of viral copies(<138 copies/mL). A negative result must be combined with clinical observations, patient history, and epidemiological information. The expected result is Negative.  Fact Sheet for Patients:  EntrepreneurPulse.com.au  Fact Sheet for Healthcare Providers:  IncredibleEmployment.be  This test is no t yet approved or cleared by the [place] FDA and  has been authorized for detection and/or  diagnosis of SARS-CoV-2 by FDA under an Emergency Use Authorization (EUA). This EUA will remain  in effect (meaning this test can be used) for the duration of the COVID-19 declaration under Section 564(b)(1) of the Act,  21 U.S.C.section 360bbb-3(b)(1), unless the authorization is terminated  or revoked sooner.       Influenza A by PCR POSITIVE (A) NEGATIVE Final   Influenza B by PCR NEGATIVE NEGATIVE Final    Comment: (NOTE) The Xpert Xpress SARS-CoV-2/FLU/RSV plus assay is intended as an aid in the diagnosis of influenza from Nasopharyngeal swab specimens and should not be used as a sole basis for treatment. Nasal washings and aspirates are unacceptable for Xpert Xpress SARS-CoV-2/FLU/RSV testing.  Fact Sheet for Patients: EntrepreneurPulse.com.au  Fact Sheet for Healthcare Providers: IncredibleEmployment.be  This test is not yet approved or cleared by the [place] FDA and has been authorized for detection and/or diagnosis of SARS-CoV-2 by FDA under an Emergency Use Authorization (EUA). This EUA will remain in effect (meaning this test can be used) for the duration of the COVID-19 declaration under Section 564(b)(1) of the Act, 21 U.S.C. section 360bbb-3(b)(1), unless the authorization is terminated or revoked.  Performed at [facility], [address] [place], [place] [address]      Labs: Basic Metabolic Panel: Recent Labs  Lab 01/08/21 1314 01/08/21 1315 01/08/21 2008 01/09/21 0442 01/10/21 0209 01/10/21 1306  NA  --  138 140 136 136  --   K  --  3.2* 3.3* 3.6 2.8* 3.2*  CL  --  99 99 99 97*  --   CO2  --  30 31 28 29   --   GLUCOSE  --  100* 141* 118* 95  --   BUN  --  16 15 13 16   --   CREATININE  --  0.89 0.80 0.78 1.04*  --   CALCIUM  --  9.6 8.7* 8.8* 8.9  --   MG 2.0  --   --  1.9  --   --    Liver Function Tests: Recent Labs  Lab 01/08/21 1315 01/08/21 2008 01/09/21 0442 01/10/21 0209  AST 255* 177* 153* 92*  ALT 318* 268* 256* 198*  ALKPHOS 59 47 48 51  BILITOT 1.0 0.7 1.0 0.8  PROT 6.7 5.8* 5.8* 5.8*  ALBUMIN 4.5 3.9 3.3* 3.4*   CBC: Recent Labs  Lab 01/08/21 1305 01/09/21 0442  WBC 4.9  5.0  NEUTROABS 3.3 3.6  HGB 13.5 12.6  HCT 42.9 39.4  MCV 86.0 84.5  PLT 253 226   CBG: No results for input(s): GLUCAP in the last 168 hours. Hgb A1c No results for input(s): HGBA1C in the last 72 hours. Lipid Profile No results for input(s): CHOL, HDL, LDLCALC, TRIG, CHOLHDL, LDLDIRECT in the last 72 hours. Thyroid function studies No results for input(s): TSH, T4TOTAL, T3FREE, THYROIDAB in the last 72 hours.  Invalid input(s): FREET3 Urinalysis    Component Value Date/Time   COLORURINE YELLOW 01/08/2021 [place] 01/08/2021 1332   LABSPEC 1.010 01/08/2021 1332   PHURINE 6.0 01/08/2021 1332   GLUCOSEU NEGATIVE 01/08/2021 1332   HGBUR NEGATIVE 01/08/2021 1332   BILIRUBINUR NEGATIVE 01/08/2021 1332   KETONESUR NEGATIVE 01/08/2021 1332   PROTEINUR TRACE (A) 01/08/2021 1332   NITRITE NEGATIVE 01/08/2021 1332   LEUKOCYTESUR NEGATIVE 01/08/2021 1332    FURTHER DISCHARGE INSTRUCTIONS:   Get Medicines reviewed and adjusted: Please take all your medications with you for your next visit with your  Primary MD   Laboratory/radiological data: Please request your Primary MD to go over all hospital tests and procedure/radiological results at the follow up, please ask your Primary MD to get all Hospital records sent to his/her office.   In some cases, they will be blood work, cultures and biopsy results pending at the time of your discharge. Please request that your primary care M.D. goes through all the records of your hospital data and follows up on these results.   Also Note the following: If you experience worsening of your admission symptoms, develop shortness of breath, life threatening emergency, suicidal or homicidal thoughts you must seek medical attention immediately by calling 911 or calling your MD immediately  if symptoms less severe.   You must read complete instructions/literature along with all the possible adverse reactions/side effects for all the  Medicines you take and that have been prescribed to you. Take any new Medicines after you have completely understood and accpet all the possible adverse reactions/side effects.    Do not drive when taking Pain medications or sleeping medications (Benzodaizepines)   Do not take more than prescribed Pain, Sleep and Anxiety Medications. It is not advisable to combine anxiety,sleep and pain medications without talking with your primary care practitioner   Special Instructions: If you have smoked or chewed Tobacco  in the last 2 yrs please stop smoking, stop any regular Alcohol  and or any Recreational drug use.   Wear Seat belts while driving.   Please note: You were cared for by a hospitalist during your hospital stay. Once you are discharged, your primary care physician will handle any further medical issues. Please note that NO REFILLS for any discharge medications will be authorized once you are discharged, as it is imperative that you return to your primary care physician (or establish a relationship with a primary care physician if you do not have one) for your post hospital discharge needs so that they can reassess your need for medications and monitor your lab values.  Time coordinating discharge: 40 minutes  SIGNED:  Marzetta Board, MD, PhD 01/10/2021, 2:37 PM

## 2021-01-10 NOTE — Plan of Care (Signed)

## 2021-01-10 NOTE — Progress Notes (Signed)
Patient ambulated 450 feet. Pt tolerated well. O2 saturation 96-100% on room air while ambulating Patient sitting on side of bed. O2 sats 100 %. Family at bedside. Call  bell within reach.

## 2021-01-18 DIAGNOSIS — J101 Influenza due to other identified influenza virus with other respiratory manifestations: Secondary | ICD-10-CM | POA: Diagnosis not present

## 2021-01-18 DIAGNOSIS — I420 Dilated cardiomyopathy: Secondary | ICD-10-CM | POA: Diagnosis not present

## 2021-01-18 DIAGNOSIS — R945 Abnormal results of liver function studies: Secondary | ICD-10-CM | POA: Diagnosis not present

## 2021-01-18 DIAGNOSIS — E876 Hypokalemia: Secondary | ICD-10-CM | POA: Diagnosis not present

## 2021-01-18 DIAGNOSIS — I509 Heart failure, unspecified: Secondary | ICD-10-CM | POA: Diagnosis not present

## 2021-01-28 ENCOUNTER — Telehealth: Payer: Self-pay | Admitting: Cardiology

## 2021-01-28 MED ORDER — METOPROLOL SUCCINATE ER 25 MG PO TB24: 25.0000 mg | ORAL_TABLET | Freq: Every day | ORAL | 3 refills | Status: DC

## 2021-01-28 NOTE — Telephone Encounter (Signed)
°*  STAT* If patient is at the pharmacy, call can be transferred to refill team.   1. Which medications need to be refilled? (please list name of each medication and dose if known)  metoprolol succinate (TOPROL-XL) 25 MG 24 hr tablet  2. Which pharmacy/location (including street and city if local pharmacy) is medication to be sent to? [place], [place]  3. Do they need a 30 day or 90 day supply?  90 day supply

## 2021-02-01 DIAGNOSIS — R945 Abnormal results of liver function studies: Secondary | ICD-10-CM | POA: Diagnosis not present

## 2021-04-04 ENCOUNTER — Other Ambulatory Visit: Payer: Self-pay | Admitting: Cardiology

## 2021-04-04 NOTE — Telephone Encounter (Signed)
Prescription refill request for Eliquis received. ?Indication:Afib ?Last office visit:4/22 ?Scr:1.0 ?Age: 81 ?Weight:62.2 kg ? ?Prescription refilled ? ?

## 2021-04-10 DIAGNOSIS — I420 Dilated cardiomyopathy: Secondary | ICD-10-CM | POA: Diagnosis not present

## 2021-04-10 DIAGNOSIS — D6869 Other thrombophilia: Secondary | ICD-10-CM | POA: Diagnosis not present

## 2021-04-10 DIAGNOSIS — I509 Heart failure, unspecified: Secondary | ICD-10-CM | POA: Diagnosis not present

## 2021-04-10 DIAGNOSIS — E042 Nontoxic multinodular goiter: Secondary | ICD-10-CM | POA: Diagnosis not present

## 2021-04-10 DIAGNOSIS — I4891 Unspecified atrial fibrillation: Secondary | ICD-10-CM | POA: Diagnosis not present

## 2021-04-10 DIAGNOSIS — Z23 Encounter for immunization: Secondary | ICD-10-CM | POA: Diagnosis not present

## 2021-04-10 DIAGNOSIS — I10 Essential (primary) hypertension: Secondary | ICD-10-CM | POA: Diagnosis not present

## 2021-04-10 DIAGNOSIS — Z0000 Encounter for general adult medical examination without abnormal findings: Secondary | ICD-10-CM | POA: Diagnosis not present

## 2021-04-10 DIAGNOSIS — E7800 Pure hypercholesterolemia, unspecified: Secondary | ICD-10-CM | POA: Diagnosis not present

## 2021-04-10 DIAGNOSIS — I700 Atherosclerosis of aorta: Secondary | ICD-10-CM | POA: Diagnosis not present

## 2021-04-10 DIAGNOSIS — R7301 Impaired fasting glucose: Secondary | ICD-10-CM | POA: Diagnosis not present

## 2021-04-10 DIAGNOSIS — G40909 Epilepsy, unspecified, not intractable, without status epilepticus: Secondary | ICD-10-CM | POA: Diagnosis not present

## 2021-07-30 ENCOUNTER — Other Ambulatory Visit: Payer: Self-pay

## 2021-07-30 MED ORDER — LEVETIRACETAM 500 MG PO TABS: 500.0000 mg | ORAL_TABLET | Freq: Two times a day (BID) | ORAL | 0 refills | Status: DC

## 2021-09-05 ENCOUNTER — Other Ambulatory Visit: Payer: Self-pay | Admitting: Cardiology

## 2021-09-16 ENCOUNTER — Encounter: Payer: Self-pay | Admitting: Family Medicine

## 2021-09-16 ENCOUNTER — Ambulatory Visit: Payer: Medicare PPO | Admitting: Family Medicine

## 2021-09-16 VITALS — BP 146/59 | HR 46 | Ht 61.0 in | Wt 142.0 lb

## 2021-09-16 DIAGNOSIS — G4733 Obstructive sleep apnea (adult) (pediatric): Secondary | ICD-10-CM | POA: Diagnosis not present

## 2021-09-16 DIAGNOSIS — R251 Tremor, unspecified: Secondary | ICD-10-CM | POA: Diagnosis not present

## 2021-09-16 DIAGNOSIS — R569 Unspecified convulsions: Secondary | ICD-10-CM | POA: Diagnosis not present

## 2021-09-16 DIAGNOSIS — D329 Benign neoplasm of meninges, unspecified: Secondary | ICD-10-CM | POA: Diagnosis not present

## 2021-09-16 MED ORDER — LEVETIRACETAM 500 MG PO TABS: 500.0000 mg | ORAL_TABLET | Freq: Two times a day (BID) | ORAL | 3 refills | Status: DC

## 2021-09-16 NOTE — Patient Instructions (Signed)
Below is our plan:  We will continue levetiracetam '500mg'$  twice daily. Continue to monitor tremor. Consider occupation therapy. We will continue to monitor meningiomas. CT stable 12/2018. Consider management of sleep apnea in the future. CPAP is the gold standard in treatment, however, some folks choose to talk to dentistry regarding a mouth guard.   Please make sure you are staying well hydrated. I recommend 50-60 ounces daily. Well balanced diet and regular exercise encouraged. Consistent sleep schedule with 6-8 hours recommended.   Please continue follow up with care team as directed.   Follow up with me in 1 year   You may receive a survey regarding today's visit. I encourage you to leave honest feed back as I do use this information to improve patient care. Thank you for seeing me today!

## 2021-09-16 NOTE — Progress Notes (Signed)
PATIENT: Maureen [name] DOB: [birth year]  REASON FOR VISIT: follow up HISTORY FROM: patient  Chief Complaint  Patient presents with   Follow-up    Pt alone, rm 16. She has had some weakness in lower extremitites and tremors. Tremors have worsened to a point difficult for her to sign her name.     HISTORY OF PRESENT ILLNESS:  09/16/21 ALL:  Maureen [name] returns for follow up for seizures, hx meningioma and OSA. She continues levetiracetam '500mg'$  BID. No recent seizure activity. She is tolerating levetiracetam without obvious adverse effects.   She reports episodes of feeling weak, she feels like she has brain fog and has shaking all over, more so in hands. She notes this may happen 1-2 times a month and some months it does not happen. She reports with some events, she feels clammy. She feels events last anywhere from a couple minutes to about 15 minutes. She reports that eating a snack makes her feel better. She will usually drink orange juice. No LOC or loss of bowel/bladder control. She is prediabetic. She has follow up scheduled with PCP.   She does have left sided tremor noted with some action over the past few years. It may have worsened slightly. She does feel that she has a harder time witting or painting. No resting tremor.   CT 12/2020 showed multiple meningiomas which were unchanged from MRI 03/2018. She is not interested in updating MRI.   09/19/2020 ALL: Maureen [name] returns for follow up for seizures, history of meningioma and OSA. She continues levetiracetam '500mg'$  BID. No recent seizure activity.  She is tolerating medication well with no obvious adverse effects.   She continues to decline OSA treatment.   MRI was ordered last year for monitoring of meningiomas.  Last MRI in March 2020 did show enlargement of previous right and left frontal meningiomas and new right frontal lesion. She did not have imaging completed. She reports going to the wrong imaging center and can not remember if she  ever rescheduled imaging.   06/28/2019 ALL: Maureen [name] is a 81 y.o. female here today for follow up for history of epilepsy. She continues levetiracetam 500 BID. She denies any seizure activity since last being seen. She does mention an episode three months ago where she had "tightening" of the muscles of right lower extremity for about 30 minutes. She was lying in bed and noted symptoms. She was completely alert. She reports her husband was asleep so she did not want to make noises but could have talked if she needed to. She tried rolling over th her left side but symptoms continued. She states spasms spontaneously resolved. She does report missing several doses (unsure exactly how long she had been out of medication) just prior to event. She also reports not taking levetiracetam for the past week due to not having refills. No other seizure concerns. She is tolerating medicaton well. She does have persistent aching pain of both hands. She feels it is related to arthritis. She uses a topical cream which helps.   She was diagnosed with sleep apnea last year. AHI 40/hr. She does not wish to use CPAP therapy. She reports reading about sleep apnea and does not wish to start therapy. She is concerned that her husband "has gotten dependent" on his CPAP and she is adamant that she will not use it.   HISTORY: (copied from Dr Dohmeier's note on 03/03/2018)  Maureen [name] is an 81 year old female with a history of seizures  and meningiomata. This patient is a left-handed Caucasian married female with a history of a left craniotomy for a left parasagittal meningioma resection on 01/04/1991.  Recent admission to hospital ( 02-22-2018)  for new onset atrial fibrillation, echo and cardioversion.  Medications were changed due to need for anticoagulation, and the patient placed on Keppra, Leviracetam.  She was weaned off Carbamazepine- completely .  She was only during her hs hospital stay placed on amiodarone,  this needed  not  to be continued after succesfull cardioversion. She remains on TOPROL, feels tired.   She is here for her routine yearly seizure follow up , but needs a sleep study for atrial fib risk work up. Atrial hypertrophy.   She remains hoarse since intubation.    Interval history from 11/10/2016 I have the pleasure of seeing Maureen [name]. Fleagel today, meanwhile 81 years old, And followed in our practice for a remote history of seizures.Patient is tolerating Tegretol XL 4 years very well, she operates a motor vehicle without difficulty had no accidents. She denies any recent seizure events. The last possible seizure was 2002, years after her craniotomy. She reports concern of recurrence of meningeomata. She had her last MRI 20 month ago. Reviewed today the MRI study from January 2017, which documented multiple small meningiomata. These measure in the millimeter range. They could however have grown in the meanwhile. I will repeat this brain MRI study in November or December of this year   08-03-2015 MM. Maureen [name] is a 81 year old female with a history of seizures and meningiomata. She returns today for follow-up. She states that she is doing well. She continues to take Tegretol XL and tolerating it well. Denies any seizure events. She operates a Teacher, music without difficulty. Denies any changes with her gait or balance. Able to complete all ADLs independently. She returns today for an evaluation.   HISTORY 07/11/14 (MM): Maureen [name] is a 81 year old female with a history of seizures and meningioma. She returns today for follow-up. Patient is currently taken Tegretol XL and tolerating it well. She reports that she is not had a seizure since the last visit. She is able to complete all ADLs independently. She operates a Teacher, music without difficulty. Since the last visit she does notice a mild tremor in the left hand is intermittent. She denies any changes with her gait or balance. She continues to take  calcium and vitamin D supplementation for osteopenia She returns today for an evaluation.   HISTORY 05/26/13 (CD):   Maureen [name] is a 81 y.o. female seen at [facility] here as a transfer of care from [facility]. She is a retired Tourist information centre manager , with a Scientist, water quality , taught elementary school.  Patient has been followed him as her primary care provider by Dr. Osvaldo Human, now Dr Serita Grammes.  This patient is a left-handed Caucasian married female with a history of a left craniotomy for a left parasagittal meningioma resection on 01/04/1991.  She has been followed by Dr. Morene Antu since July 1992 . Her last seizure is now over 10 years ago.   Preoperatively she was treated with Decadron and Phenobarbital after she developed a seizure , focal to the right body, this let to an evaluation and the discovery of the meningeoma. After the surgery she was started on Dilantin but developed a rash and became ataxic to this medication was discontinued. She continued for while to have abnormal sensations in her right leg ans sometimes right arm ,  which in most respects were identified a simple partial seizures. Inspired Dr. Tressia Danas  last EEG was normal. She continued to the right foot and leg motor seizures that she describes as a spasm of the right foot.  Dr. Erling Cruz placed onTegretol since she had marked improvement. At the time of his last visit on 08/06/2009 the patient took brand nameTegretol-XR 200 mg twice a day she had a slight decrease in serum Na levels and 2008 underwent DEXA scan,  which was normal. She still reported about once a year  A symptom of leg spasm, no seizures per se. Possible  symptom of spasm beginning in the right  Foot and ankle up going to the right knee and then radiating downwards to the foot with a duration of less than a minute. A repeated DEXA scan revealed ion13 April 2014 newly diagnosed severe osteopenia. She begun taking vitamin D and calcium, recently in the last 6 month.     REVIEW OF  SYSTEMS: Out of a complete 14 system review of symptoms, the patient complains only of the following symptoms, seizure, pain of both hands and all other reviewed systems are negative.  ALLERGIES: Allergies  Allergen Reactions   Dilantin [Phenytoin Sodium Extended]      Stupor, ataxia.    Hydrocodone Nausea And Vomiting    HOME MEDICATIONS: Outpatient Medications Prior to Visit  Medication Sig Dispense Refill   amiodarone (PACERONE) 200 MG tablet Take 1 tablet (200 mg total) by mouth daily. 30 tablet 6   Calcium-Magnesium-Zinc (CAL-MAG-ZINC PO) Take 1 tablet by mouth daily at 12 noon.     Cholecalciferol (VITAMIN D-3) 25 MCG (1000 UT) CAPS Take 1,000 Units by mouth daily. Take 1 capsule daily     ELIQUIS 5 MG TABS tablet TAKE 1 TABLET TWICE DAILY 180 tablet 1   furosemide (LASIX) 40 MG tablet Take 1 tablet (40 mg total) by mouth daily. (Patient taking differently: Take 40 mg by mouth daily as needed for edema or fluid.) 30 tablet 3   losartan (COZAAR) 50 MG tablet Take 1 tablet (50 mg total) by mouth daily. SCHEDULE OFFICE VISIT FOR FUTURE REFILLS. 30 tablet 0   metoprolol succinate (TOPROL-XL) 25 MG 24 hr tablet Take 1 tablet (25 mg total) by mouth daily. 90 tablet 3   Multiple Vitamin (MULTIVITAMIN WITH MINERALS) TABS tablet Take 1 tablet by mouth daily.     pravastatin (PRAVACHOL) 40 MG tablet Take 40 mg by mouth daily.      levETIRAcetam (KEPPRA) 500 MG tablet Take 1 tablet (500 mg total) by mouth 2 (two) times daily. 180 tablet 0   No facility-administered medications prior to visit.    PAST MEDICAL HISTORY: Past Medical History:  Diagnosis Date   Atrial fibrillation (HCC)    Benign tumor of meninges (HCC)    Chronic combined systolic and diastolic CHF, NYHA class 2 and ACA/AHA stage C 01/2018   Exacerbated by A. fib; EF 35 to 40% with global HK.  Reduced cardiac output and index (3.2/1.9).   -Associated moderate pulmonary hypertension-(mean PA P 38 mmHg)   GERD  (gastroesophageal reflux disease)    occ. in past   Headache(784.0)    HTN (hypertension), benign    pt. states no hypertension   NICM (nonischemic cardiomyopathy) ([place]) 01/2018   Echo with EF of 35 to 40%.  Global HK.  Mild to moderate MR.  Mild nonobstructive CAD by cath.  Cardiac output/index 3.2/1.9   Pneumonia    ? as child  PONV (postoperative nausea and vomiting)    Seizures (HCC)    positive for HBP    PAST SURGICAL HISTORY: Past Surgical History:  Procedure Laterality Date   CARDIOVERSION N/A 02/24/2018   Procedure: CARDIOVERSION;  Surgeon: Sueanne Margarita, MD;  Location: [place] [facility];  Service: Cardiovascular;  Laterality: N/A;   CATARACT EXTRACTION, BILATERAL Bilateral    CHOLECYSTECTOMY     COLONOSCOPY WITH PROPOFOL N/A 11/30/2012   Procedure: COLONOSCOPY WITH PROPOFOL;  Surgeon: Garlan Fair, MD;  Location: [facility];  Service: Endoscopy;  Laterality: N/A;   ESOPHAGOGASTRODUODENOSCOPY (EGD) WITH PROPOFOL N/A 11/30/2012   Procedure: ESOPHAGOGASTRODUODENOSCOPY (EGD) WITH PROPOFOL;  Surgeon: Garlan Fair, MD;  Location: [facility];  Service: Endoscopy;  Laterality: N/A;   ESOPHAGOGASTRODUODENOSCOPY (EGD) WITH PROPOFOL N/A 03/14/2015   Procedure: ESOPHAGOGASTRODUODENOSCOPY (EGD) WITH PROPOFOL;  Surgeon: Arta Silence, MD;  Location: [facility];  Service: Endoscopy;  Laterality: N/A;   EUS N/A 01/05/2013   Procedure: FULL UPPER ENDOSCOPIC ULTRASOUND (EUS) RADIAL;  Surgeon: Arta Silence, MD;  Location: [facility];  Service: Endoscopy;  Laterality: N/A;  EUS with possible FNA   EUS N/A 03/14/2015   Procedure: UPPER ENDOSCOPIC ULTRASOUND (EUS) RADIAL;  Surgeon: Arta Silence, MD;  Location: [facility];  Service: Endoscopy;  Laterality: N/A;   EYE SURGERY Left    "few weeks ago scar tissue laser surgery"   FINE NEEDLE ASPIRATION N/A 01/05/2013   Procedure: FINE NEEDLE ASPIRATION (FNA) RADIAL;  Surgeon: Arta Silence, MD;  Location: [facility];  Service:  Endoscopy;  Laterality: N/A;   KNEE ARTHROSCOPY Left    MYELOMININGOCELE REPAIR  12/1990   RIGHT/LEFT HEART CATH AND CORONARY ANGIOGRAPHY N/A 02/22/2018   Procedure: RIGHT/LEFT HEART CATH AND CORONARY ANGIOGRAPHY;  Surgeon: Larey Dresser, MD;  Location: [facility];;  Nonobstructive CAD.  Mild to moderate pulmonary hypertension (PA P 47/24 mmHg-mean 38 mmHg.  PCWP 22 mmHg with LVEDP 24 mmHg. CO/CI = 3.2/1.9   TEE WITHOUT CARDIOVERSION N/A 02/24/2018   Procedure: TRANSESOPHAGEAL ECHOCARDIOGRAM (TEE) - WITH DCCV;  Surgeon: Sueanne Margarita, MD;  Location: [facility] ENDOS[facility];; EF 35-40%.  Global HK.  No R WMA. Mild AI, Mild-Mod MR. Severe LA dilation (no LAA thrombus).  Severely reduced RV function.  Mild RA dilation --successful DCCV   TRANSTHORACIC ECHOCARDIOGRAM  02/18/2018   EF 35 to 40%.  Unable to assess diastolic function because of A. fib.  No obvious regional wall motion abnormality.  Global hypokinesis.  Severe LA dilation.  Mild-moderate MR.  Mild AI without AS   TRANSTHORACIC ECHOCARDIOGRAM  11/2019   CHF with A. fib RVR:  EF 45 to 50%.  Mildly reduced function.  GR 3 DD with elevated LAP.  Estimated RVP/PAP 72 to 73 mmHg.  Moderate MR and TR.  Mild to moderate PR.  Suggested CVP 15 mmHg.  Interestingly, despite the severe elevated pressures.  Both right and left atria were both normal size.   VAGINAL HYSTERECTOMY  1983    FAMILY HISTORY: Family History  Problem Relation Age of Onset   Dementia Father        lewy body dementia   Heart disease Other        PACEMAKER DEFIB.      SOCIAL HISTORY: Social History   Socioeconomic History   Marital status: Married    Spouse name: Jan   Number of children: 2   Years of education: Master's   Highest education level: Not on file  Occupational History   Occupation: retired  Comment: [employer] in [place]: RETIRED  Tobacco Use   Smoking status: Never   Smokeless tobacco: Never  Vaping Use   Vaping Use: Never  used  Substance and Sexual Activity   Alcohol use: Not Currently    Comment: once monthly   Drug use: No   Sexual activity: Not on file  Other Topics Concern   Not on file  Social History Narrative   She retired in 1995 from [employer]. Lives at home with her husband, Jan. They have a son age 35 and a  son age 76. Cafffeine once daily. a week and alcohol once a month. Denies tobacco and illicit drug use.    Patient is left handed.   Patient has a Master's degree.   Social Determinants of Health   Financial Resource Strain: Not on file  Food Insecurity: Not on file  Transportation Needs: Not on file  Physical Activity: Not on file  Stress: Not on file  Social Connections: Not on file  Intimate Partner Violence: Not on file      PHYSICAL EXAM  Vitals:   09/16/21 1317  BP: (!) 146/59  Pulse: (!) 46  Weight: 142 lb (64.4 kg)  Height: '5\' 1"'$  (1.549 m)     Body mass index is 26.83 kg/m.  Generalized: Well developed, in no acute distress  Cardiology: normal rate and rhythm, no murmur noted Respiratory: clear to auscultation bilaterally  Neurological examination  Mentation: Alert oriented to time, place, history taking. Follows all commands speech and language fluent Cranial nerve II-XII: Pupils were equal round reactive to light. Extraocular movements were full, visual field were full on confrontational test. Facial sensation and strength were normal. Head turning and shoulder shrug  were normal and symmetric. Motor: The motor testing reveals 5 over 5 strength of all 4 extremities. Good symmetric motor tone is noted throughout. No tremor noted  Sensory: Sensory testing is intact to soft touch on all 4 extremities. No evidence of extinction is noted.  Coordination: Cerebellar testing reveals good finger-nose-finger and heel-to-shin bilaterally.  Gait and station: Gait is normal.   DIAGNOSTIC DATA (LABS, IMAGING, TESTING) - I reviewed patient records, labs,  notes, testing and imaging myself where available.      No data to display           Lab Results  Component Value Date   WBC 5.0 01/09/2021   HGB 12.6 01/09/2021   HCT 39.4 01/09/2021   MCV 84.5 01/09/2021   PLT 226 01/09/2021      Component Value Date/Time   NA 136 01/10/2021 0209   NA 141 02/22/2020 0946   K 3.2 (L) 01/10/2021 1306   CL 97 (L) 01/10/2021 0209   CO2 29 01/10/2021 0209   GLUCOSE 95 01/10/2021 0209   BUN 16 01/10/2021 0209   BUN 20 02/22/2020 0946   CREATININE 1.04 (H) 01/10/2021 0209   CALCIUM 8.9 01/10/2021 0209   PROT 5.8 (L) 01/10/2021 0209   PROT 6.4 02/22/2020 0946   ALBUMIN 3.4 (L) 01/10/2021 0209   ALBUMIN 4.5 02/22/2020 0946   AST 92 (H) 01/10/2021 0209   ALT 198 (H) 01/10/2021 0209   ALKPHOS 51 01/10/2021 0209   BILITOT 0.8 01/10/2021 0209   BILITOT 0.5 02/22/2020 0946   GFRNONAA 54 (L) 01/10/2021 0209   GFRAA 63 02/22/2020 0946   Lab Results  Component Value Date   CHOL 194 02/22/2020   HDL 64 02/22/2020   LDLCALC 114 (  H) 02/22/2020   TRIG 91 02/22/2020   CHOLHDL 3.0 02/22/2020   No results found for: "HGBA1C" No results found for: "VITAMINB12" Lab Results  Component Value Date   TSH 1.573 12/10/2019       ASSESSMENT AND PLAN 81 y.o. year old female  has a past medical history of Atrial fibrillation ([place]), Benign tumor of meninges ([place]), Chronic combined systolic and diastolic CHF, NYHA class 2 and ACA/AHA stage C (01/2018), GERD (gastroesophageal reflux disease), Headache(784.0), HTN (hypertension), benign, NICM (nonischemic cardiomyopathy) ([place]) (01/2018), Pneumonia, PONV (postoperative nausea and vomiting), and Seizures ([place]). here with     ICD-10-CM   1. Seizures ([place])  R56.9     2. Meningioma (HCC)  D32.9     3. OSA (obstructive sleep apnea)  G47.33     4. Tremor of left hand  R25.1        Mrs Laughery reports that she is doing well.  She is tolerating levetiracetam 500 mg twice daily.  We will continue current  therapy.  Seizure precautions reviewed.  Adequate hydration discussed. CT 12/2020 shows meningiomas are stable. She declines MRI.  We have reviewed her sleep study results.  She is adamant that she does not wish to use CPAP therapy. I have provided her with information for her review via AVS. I have also mentioned talking to her dentist regarding an oral appliance, however, she is aware that CPAP is gold standard in sleep apnea management. She is aware that she may call should she change her mind. We have discussed events that sound hypoglycemic in nature. She does not feel these events are seizure related. I have offered to repeat EEG if events are not improving with CBG monitoring. We have also discussed likely essential tremor. She is not interested in medication management. OT referral offered but declined at this time. She will continue close follow-up with primary care for management of comorbidities. She will return to see me in 1 year.    No orders of the defined types were placed in this encounter.     Meds ordered this encounter  Medications   levETIRAcetam (KEPPRA) 500 MG tablet    Sig: Take 1 tablet (500 mg total) by mouth 2 (two) times daily.    Dispense:  180 tablet    Refill:  3    Order Specific Question:   Supervising Provider    Answer:   Melvenia Beam [id number]      I spent 30 minutes of face-to-face and non-face-to-face time with patient.  This included previsit chart review, lab review, study review, order entry, electronic health record documentation, patient education.   Debbora Presto, FNP-C 09/16/2021, 1:59 PM [facility] [address] [place], [place] [address] [phone]

## 2021-09-25 DIAGNOSIS — R413 Other amnesia: Secondary | ICD-10-CM | POA: Diagnosis not present

## 2021-09-25 DIAGNOSIS — I4891 Unspecified atrial fibrillation: Secondary | ICD-10-CM | POA: Diagnosis not present

## 2021-09-25 DIAGNOSIS — G40909 Epilepsy, unspecified, not intractable, without status epilepticus: Secondary | ICD-10-CM | POA: Diagnosis not present

## 2021-09-25 DIAGNOSIS — R7303 Prediabetes: Secondary | ICD-10-CM | POA: Diagnosis not present

## 2021-09-25 DIAGNOSIS — G4733 Obstructive sleep apnea (adult) (pediatric): Secondary | ICD-10-CM | POA: Diagnosis not present

## 2021-09-25 DIAGNOSIS — Z79899 Other long term (current) drug therapy: Secondary | ICD-10-CM | POA: Diagnosis not present

## 2021-09-25 DIAGNOSIS — F411 Generalized anxiety disorder: Secondary | ICD-10-CM | POA: Diagnosis not present

## 2021-09-26 DIAGNOSIS — H04123 Dry eye syndrome of bilateral lacrimal glands: Secondary | ICD-10-CM | POA: Diagnosis not present

## 2021-09-30 ENCOUNTER — Telehealth: Payer: Self-pay | Admitting: Cardiology

## 2021-09-30 NOTE — Telephone Encounter (Signed)
     9/8:  GLUCOSE  89; A1c 5.9, TSH 1.32; CBC 5.5, 11.4/35.1, 237

## 2021-10-11 DIAGNOSIS — F411 Generalized anxiety disorder: Secondary | ICD-10-CM | POA: Diagnosis not present

## 2021-10-11 DIAGNOSIS — G40909 Epilepsy, unspecified, not intractable, without status epilepticus: Secondary | ICD-10-CM | POA: Diagnosis not present

## 2021-10-11 DIAGNOSIS — E7800 Pure hypercholesterolemia, unspecified: Secondary | ICD-10-CM | POA: Diagnosis not present

## 2021-10-11 DIAGNOSIS — R413 Other amnesia: Secondary | ICD-10-CM | POA: Diagnosis not present

## 2021-10-11 DIAGNOSIS — R7303 Prediabetes: Secondary | ICD-10-CM | POA: Diagnosis not present

## 2021-10-11 DIAGNOSIS — D509 Iron deficiency anemia, unspecified: Secondary | ICD-10-CM | POA: Diagnosis not present

## 2021-10-23 ENCOUNTER — Other Ambulatory Visit: Payer: Self-pay | Admitting: Neurology

## 2021-10-23 ENCOUNTER — Telehealth: Payer: Self-pay | Admitting: Family Medicine

## 2021-10-23 ENCOUNTER — Telehealth: Payer: Self-pay | Admitting: Cardiology

## 2021-10-23 MED ORDER — APIXABAN 5 MG PO TABS: 5.0000 mg | ORAL_TABLET | Freq: Two times a day (BID) | ORAL | 1 refills | Status: DC

## 2021-10-23 MED ORDER — METOPROLOL SUCCINATE ER 25 MG PO TB24: 25.0000 mg | ORAL_TABLET | Freq: Every day | ORAL | 1 refills | Status: DC

## 2021-10-23 MED ORDER — FUROSEMIDE 40 MG PO TABS: 40.0000 mg | ORAL_TABLET | Freq: Every day | ORAL | 6 refills | Status: DC

## 2021-10-23 MED ORDER — LEVETIRACETAM 500 MG PO TABS: 500.0000 mg | ORAL_TABLET | Freq: Two times a day (BID) | ORAL | 2 refills | Status: DC

## 2021-10-23 NOTE — Telephone Encounter (Signed)
Pt c/o medication issue:  1. Name of Medication: furosemide (LASIX) 40 MG tablet ELIQUIS 5 MG TABS tablet metoprolol succinate (TOPROL-XL) 25 MG 24 hr tablet  2. How are you currently taking this medication (dosage and times per day)?   3. Are you having a reaction (difficulty breathing--STAT)?   4. What is your medication issue? Pharmacy is calling stating patient would like to switch these medications to them. Pt was using [facility], but would like to switch to [facility] [place], [place] - [address]

## 2021-10-23 NOTE — Telephone Encounter (Signed)
[facility]

## 2021-10-23 NOTE — Telephone Encounter (Signed)
[facility] has called, pt is changing to this pharmacy and they are asking that a Rx be sent to them for pt's levETIRAcetam (KEPPRA) 500 MG tablet

## 2021-10-23 NOTE — Telephone Encounter (Signed)
Refill sent for the pt

## 2021-10-23 NOTE — Telephone Encounter (Signed)
Refills sent to last until appt in feb

## 2021-10-24 DIAGNOSIS — H04123 Dry eye syndrome of bilateral lacrimal glands: Secondary | ICD-10-CM | POA: Diagnosis not present

## 2021-11-15 DIAGNOSIS — E7800 Pure hypercholesterolemia, unspecified: Secondary | ICD-10-CM | POA: Diagnosis not present

## 2021-11-15 DIAGNOSIS — I4891 Unspecified atrial fibrillation: Secondary | ICD-10-CM | POA: Diagnosis not present

## 2021-11-15 DIAGNOSIS — I509 Heart failure, unspecified: Secondary | ICD-10-CM | POA: Diagnosis not present

## 2021-11-15 DIAGNOSIS — I10 Essential (primary) hypertension: Secondary | ICD-10-CM | POA: Diagnosis not present

## 2022-02-17 ENCOUNTER — Encounter: Payer: Self-pay | Admitting: Cardiology

## 2022-02-17 ENCOUNTER — Ambulatory Visit: Payer: Medicare PPO | Attending: Cardiology | Admitting: Cardiology

## 2022-02-17 VITALS — BP 140/74 | HR 47 | Ht 61.0 in | Wt 142.4 lb

## 2022-02-17 DIAGNOSIS — E782 Mixed hyperlipidemia: Secondary | ICD-10-CM

## 2022-02-17 DIAGNOSIS — I10 Essential (primary) hypertension: Secondary | ICD-10-CM | POA: Diagnosis not present

## 2022-02-17 DIAGNOSIS — I480 Paroxysmal atrial fibrillation: Secondary | ICD-10-CM | POA: Diagnosis not present

## 2022-02-17 DIAGNOSIS — I428 Other cardiomyopathies: Secondary | ICD-10-CM

## 2022-02-17 DIAGNOSIS — R001 Bradycardia, unspecified: Secondary | ICD-10-CM | POA: Diagnosis not present

## 2022-02-17 DIAGNOSIS — Z5181 Encounter for therapeutic drug level monitoring: Secondary | ICD-10-CM | POA: Diagnosis not present

## 2022-02-17 DIAGNOSIS — I5042 Chronic combined systolic (congestive) and diastolic (congestive) heart failure: Secondary | ICD-10-CM | POA: Diagnosis not present

## 2022-02-17 DIAGNOSIS — Z7901 Long term (current) use of anticoagulants: Secondary | ICD-10-CM

## 2022-02-17 MED ORDER — METOPROLOL SUCCINATE ER 25 MG PO TB24: 25.0000 mg | ORAL_TABLET | Freq: Every day | ORAL | 1 refills | Status: DC

## 2022-02-17 NOTE — Patient Instructions (Addendum)
Other Instructions   Take this with you to your next primary appt. - Dr Ellyn Hack recommends you have this lab added to labs- SED RATE ( ESR) , C-reactive protein (C-RP), liver function panel ( Hepatic Panel)     Inform your Eye doctor - you are taking Amiodarone   Medication Instructions:    Weaning and then stop taking Metoprolol ---For the next 2 weeks ( 14 days)  take 1/2 tablet of 25 mg ( total 12.5 mg)  then stop    *If you need a refill on your cardiac medications before your next appointment, please call your pharmacy*   Lab Work:pease have done at primary with next draw- primary will order  ESR CRP Hepatic panel      Testing/Procedures: Not needed   Follow-Up: At [facility], you and your health needs are our priority.  As part of our continuing mission to provide you with exceptional heart care, we have created designated Provider Care Teams.  These Care Teams include your primary Cardiologist (physician) and Advanced Practice Providers (APPs -  Physician Assistants and Nurse Practitioners) who all work together to provide you with the care you need, when you need it.     Your next appointment:   12 month(s)  The format for your next appointment:   In Person  Provider:   Glenetta Hew, MD    Other Instructions   Take this with you to your next primary appt. - Dr Ellyn Hack recommends you have this lab added to labs- SED RATE ( ESR) , C-reactive protein (C-RP), liver function panel ( Hepatic Panel)     Inform your Eye doctor - you are taking Amiodarone

## 2022-02-17 NOTE — Progress Notes (Signed)
Primary Care Provider: Mayra Neer, [place] Cardiologist: Glenetta Hew, MD Electrophysiologist: None  Clinic Note: Chief Complaint  Patient presents with   Follow-up    Almost 2 years.;  Has lost weight.  Energy level better.   Atrial Fibrillation    1 or 2 episodes in the last 2 years.  Usually related social stress.   ===================================  ASSESSMENT/PLAN   Problem List Items Addressed This Visit       Cardiology Problems   Paroxysmal atrial fibrillation [facility]): CHA2DS2-VASc score 8 - Primary (Chronic)    Relatively rare episodes that are not very long lasting.  Pretty well-controlled on amiodarone. Tolerating Eliquis without any bleeding issues.  Okay to hold Eliquis if necessary.  The episodes of A-fib I recommended that she actually double up the amiodarone for the day and see if that brings it.      Relevant Medications   furosemide (LASIX) 40 MG tablet   metoprolol succinate (TOPROL-XL) 25 MG 24 hr tablet   Other Relevant Orders   EKG 12-Lead (Completed)   Chronic combined systolic and diastolic heart failure (HCC) (Chronic)    Minimal CHF symptoms but usually exacerbated by episodes of A-fib.  Maybe 2 times a month she will use her Lasix.  No associated PND orthopnea.  He is euvolemic on exam.  She is bradycardic and has had some dizzy spells.  We will wean off beta-blocker but continue ARB and PRN diuretic. She is already on amiodarone for rhythm control.      Relevant Medications   furosemide (LASIX) 40 MG tablet   metoprolol succinate (TOPROL-XL) 25 MG 24 hr tablet   Essential hypertension (Chronic)    BP is little elevated today, but minimally for Toprol and we may need to titrate up losartan to 100 if pressures increase.  But since she is having some dizziness will hold off for now.  With orthostatic dizziness I would not be more aggressive with diuresis.      Relevant Medications   furosemide (LASIX) 40 MG tablet    metoprolol succinate (TOPROL-XL) 25 MG 24 hr tablet   NICM (nonischemic cardiomyopathy) (HCC) (Chronic)    Mildly reduced EF.  Unfortunately we will have to stop the beta-blocker because of bradycardia and dizziness.  Will continue ARB.  Pretty much euvolemic as noted.      Relevant Medications   furosemide (LASIX) 40 MG tablet   metoprolol succinate (TOPROL-XL) 25 MG 24 hr tablet   Mixed hyperlipidemia (Chronic)    Tolerating pravastatin relatively well.  Most recent LDL was down to 76 from 114.  Should be due for follow-up labs coming up in March.  Not unreasonable to set a target of less than 70 but 76 is pretty close to.  May want to consider adding Zetia to get below 70.  Defer to PCP.      Relevant Medications   furosemide (LASIX) 40 MG tablet   metoprolol succinate (TOPROL-XL) 25 MG 24 hr tablet     Other   Medication monitoring encounter (Chronic)    Patient is on amiodarone.  Needs to have annual LFTs, TFTs checked.  Annual eye exam.  Will also check ESR and CRP for surgical history of pulmonary fibrosis (markers of inflammation)  We will add CRP and ESR LFTs and LFTs to follow-up labs with PCP in March.      Long term (current) use of anticoagulants; CHA2DS2-VASc score of 8 (Chronic)    Now on DOAC, after adjusting  her antiseizure medications.  She is on Eliquis and tolerating it well.  With no real recurrent episodes recently, but you fine to hold 2 to 3 days preop for procedures or surgeries.  High risk neural or organ biopsy procedures would probably hold for 3 days but otherwise 2 days for low risk procedures.      Bradycardia by electrocardiogram (Chronic)    On amiodarone and Toprol with heart rates in the 40s.  We are using amiodarone for rhythm control.  The low-dose Toprol was essentially for additional rate control but also for cardiomyopathy/CHF.  However she is pretty stable from that standpoint would prefer amiodarone for longer-term rhythm control.  As  such we will have her wean off Toprol.  As this happens, she may need to increase to 100 mg of losartan.  This can be evaluated when she sees her PCP in March.       ===================================  HPI:    Maureen [name] is a 82 y.o. female with a PMH notable for PAF and HFrEF (also history of meningioma with seizures) below who presents today for almost 2-year follow-up.  Maureen [name] was last seen in April 2022: She is doing well.  No further symptoms of A-fib.  Try to work on getting in better shape.  Noted some exertional dyspnea if she walks for a while, but was improving.  Building up her level of exercise tolerance.  Had some lightheadedness and dizziness with near syncope but related to hypoglycemia.  Improved after eating.  No chest pain chest pressure or dyspnea with rest or exertion.  No heart failure symptoms.  Had some mild paresthesias from residual stroke symptoms.  Recent Hospitalizations: Last hospitalization was December 2022 for influenza.  Reviewed  CV studies:    The following studies were reviewed today: (if available, images/films reviewed: From Epic Chart or Care Everywhere) No recent studies:  Interval History:   Maureen [name] returns here today for a follow-up overall doing pretty well.  She says in the last almost 2 years she has had maybe 1 or 2 episodes of A-fib that lasted me to be couple hours or so.  Usually these are associated with when she is try to care for her husband who is being somewhat difficult.  He was recently diagnosed with dementia and it causes her quite a bit of stress. Otherwise she is off and on limited dizzy sometimes with dizziness that seems to limit her exercise routines. She has rarely used as needed Lasix may be use up to 2-3 times a month.  No bleeding issues on Eliquis.  Maybe little occasional exercise intolerance would like to get up and go.  No chest pain pressure or tightness with rest or exertion.  Mild exercise  intolerance with exertional dyspnea but overall stable.  Trying to stay active.  Otherwise pretty stable from a cardiac standpoint.  CV Review of Symptoms (Summary): Cardiovascular ROS: positive for - dyspnea on exertion, irregular heartbeat, palpitations, and these episodes last maybe 2 to 3 hours at the most and are usually associated with social stress.  Off-and-on dizziness.  Well-controlled edema.  Rare use of PRN Lasix.  Rare bruising.. negative for - chest pain, orthopnea, paroxysmal nocturnal dyspnea, rapid heart rate, shortness of breath, or syncope or near syncope, TIA or amaurosis fugax, claudication.  Melena, hematochezia hematuria or epistaxis. Many of her symptoms are associated with stress and caring for her husband.   REVIEWED OF SYSTEMS   Review of Systems  Constitutional:  Positive for weight loss. Negative for malaise/fatigue (Much better exercise tolerance.).  HENT:  Negative for congestion.   Respiratory:  Negative for cough and shortness of breath.   Cardiovascular:  Negative for chest pain and palpitations.  Gastrointestinal:  Negative for blood in stool and melena.  Genitourinary:  Negative for hematuria.  Musculoskeletal:  Positive for joint pain and myalgias.  Neurological:  Positive for weakness (Legs sometimes give out on her.). Negative for dizziness and focal weakness.  Psychiatric/Behavioral:  Positive for memory loss. Negative for depression. The patient is nervous/anxious. The patient does not have insomnia.        She is under some stress as a caregiver for her husband with some dementia. She herself has some memory loss and confusion.   I have reviewed and (if needed) personally updated the patient's problem list, medications, allergies, past medical and surgical history, social and family history.   PAST MEDICAL HISTORY   Past Medical History:  Diagnosis Date   Atrial fibrillation (HCC)    Benign tumor of meninges (HCC)    Chronic combined systolic  and diastolic CHF, NYHA class 2 and ACA/AHA stage C 01/2018   Exacerbated by A. fib; EF 35 to 40% with global HK.  Reduced cardiac output and index (3.2/1.9).   -Associated moderate pulmonary hypertension-(mean PA P 38 mmHg)   GERD (gastroesophageal reflux disease)    occ. in past   Headache(784.0)    HTN (hypertension), benign    pt. states no hypertension   NICM (nonischemic cardiomyopathy) ([place]) 01/2018   Echo with EF of 35 to 40%.  Global HK.  Mild to moderate MR.  Mild nonobstructive CAD by cath.  Cardiac output/index 3.2/1.9   Pneumonia    ? as child   PONV (postoperative nausea and vomiting)    Seizures (HCC)    positive for HBP    PAST SURGICAL HISTORY   Past Surgical History:  Procedure Laterality Date   CARDIOVERSION N/A 02/24/2018   Procedure: CARDIOVERSION;  Surgeon: Sueanne Margarita, MD;  Location: [facility];  Service: Cardiovascular;  Laterality: N/A;   CATARACT EXTRACTION, BILATERAL Bilateral    CHOLECYSTECTOMY     COLONOSCOPY WITH PROPOFOL N/A 11/30/2012   Procedure: COLONOSCOPY WITH PROPOFOL;  Surgeon: Garlan Fair, MD;  Location: [facility] ENDOSCOPY;  Service: Endoscopy;  Laterality: N/A;   ESOPHAGOGASTRODUODENOSCOPY (EGD) WITH PROPOFOL N/A 11/30/2012   Procedure: ESOPHAGOGASTRODUODENOSCOPY (EGD) WITH PROPOFOL;  Surgeon: Garlan Fair, MD;  Location: [facility] ENDOSCOPY;  Service: Endoscopy;  Laterality: N/A;   ESOPHAGOGASTRODUODENOSCOPY (EGD) WITH PROPOFOL N/A 03/14/2015   Procedure: ESOPHAGOGASTRODUODENOSCOPY (EGD) WITH PROPOFOL;  Surgeon: Arta Silence, MD;  Location: [facility] ENDOSCOPY;  Service: Endoscopy;  Laterality: N/A;   EUS N/A 01/05/2013   Procedure: FULL UPPER ENDOSCOPIC ULTRASOUND (EUS) RADIAL;  Surgeon: Arta Silence, MD;  Location: [facility] ENDOSCOPY;  Service: Endoscopy;  Laterality: N/A;  EUS with possible FNA   EUS N/A 03/14/2015   Procedure: UPPER ENDOSCOPIC ULTRASOUND (EUS) RADIAL;  Surgeon: Arta Silence, MD;  Location: [facility] ENDOSCOPY;  Service: Endoscopy;   Laterality: N/A;   EYE SURGERY Left    "few weeks ago scar tissue laser surgery"   FINE NEEDLE ASPIRATION N/A 01/05/2013   Procedure: FINE NEEDLE ASPIRATION (FNA) RADIAL;  Surgeon: Arta Silence, MD;  Location: [facility] ENDOSCOPY;  Service: Endoscopy;  Laterality: N/A;   KNEE ARTHROSCOPY Left    MYELOMININGOCELE REPAIR  12/1990   RIGHT/LEFT HEART CATH AND CORONARY ANGIOGRAPHY N/A 02/22/2018   Procedure: RIGHT/LEFT HEART CATH AND CORONARY  ANGIOGRAPHY;  Surgeon: Larey Dresser, MD;  Location: [facility] INVASIVE CV LAB;;  Nonobstructive CAD.  Mild to moderate pulmonary hypertension (PA P 47/24 mmHg-mean 38 mmHg.  PCWP 22 mmHg with LVEDP 24 mmHg. CO/CI = 3.2/1.9   TEE WITHOUT CARDIOVERSION N/A 02/24/2018   Procedure: TRANSESOPHAGEAL ECHOCARDIOGRAM (TEE) - WITH DCCV;  Surgeon: Sueanne Margarita, MD;  Location: [facility] ENDOSCOPY;; EF 35-40%.  Global HK.  No R WMA. Mild AI, Mild-Mod MR. Severe LA dilation (no LAA thrombus).  Severely reduced RV function.  Mild RA dilation --successful DCCV   TRANSTHORACIC ECHOCARDIOGRAM  02/18/2018   EF 35 to 40%.  Unable to assess diastolic function because of A. fib.  No obvious regional wall motion abnormality.  Global hypokinesis.  Severe LA dilation.  Mild-moderate MR.  Mild AI without AS   TRANSTHORACIC ECHOCARDIOGRAM  11/2019   CHF with A. fib RVR:  EF 45 to 50%.  Mildly reduced function.  GR 3 DD with elevated LAP.  Estimated RVP/PAP 72 to 73 mmHg.  Moderate MR and TR.  Mild to moderate PR.  Suggested CVP 15 mmHg.  Interestingly, despite the severe elevated pressures.  Both right and left atria were both normal size.   VAGINAL HYSTERECTOMY  1983    Immunization History  Administered Date(s) Administered   PFIZER(Purple Top)SARS-COV-2 Vaccination 02/11/2019, 03/04/2019    MEDICATIONS/ALLERGIES   Current Meds  Medication Sig   amiodarone (PACERONE) 200 MG tablet Take 1 tablet (200 mg total) by mouth daily.   apixaban (ELIQUIS) 5 MG TABS tablet Take 1 tablet (5 mg total) by  mouth 2 (two) times daily.   Calcium-Magnesium-Zinc (CAL-MAG-ZINC PO) Take 1 tablet by mouth daily at 12 noon.   Cholecalciferol (VITAMIN D-3) 25 MCG (1000 UT) CAPS Take 1,000 Units by mouth daily. Take 1 capsule daily   furosemide (LASIX) 40 MG tablet 1 tablet Orally once a day if needed   losartan (COZAAR) 50 MG tablet Take 1 tablet (50 mg total) by mouth daily. SCHEDULE OFFICE VISIT FOR FUTURE REFILLS.   Multiple Vitamin (MULTIVITAMIN WITH MINERALS) TABS tablet Take 1 tablet by mouth daily.   pravastatin (PRAVACHOL) 40 MG tablet Take 40 mg by mouth daily.    [DISCONTINUED] metoprolol succinate (TOPROL-XL) 25 MG 24 hr tablet Take 1 tablet (25 mg total) by mouth daily.    Allergies  Allergen Reactions   Dilantin [Phenytoin Sodium Extended]      Stupor, ataxia.    Hydrocodone Nausea And Vomiting    SOCIAL HISTORY/FAMILY HISTORY   Reviewed in Epic:  Pertinent findings:  Social History   Tobacco Use   Smoking status: Never   Smokeless tobacco: Never  Vaping Use   Vaping Use: Never used  Substance Use Topics   Alcohol use: Not Currently    Comment: once monthly   Drug use: No   Social History   Social History Narrative   She retired in 1995 from [employer]. Lives at home with her husband, Jan. They have a son age 86 and a  son age 74. Cafffeine once daily. a week and alcohol once a month. Denies tobacco and illicit drug use.    Patient is left handed.   Patient has a Master's degree.    OBJCTIVE -PE, EKG, labs   Wt Readings from Last 3 Encounters:  02/17/22 142 lb 6.4 oz (64.6 kg)  09/16/21 142 lb (64.4 kg)  01/10/21 137 lb 1.6 oz (62.2 kg)  04/25/2020: 152 lb  Physical Exam: BP (!) 140/74  Pulse (!) 47   Ht '5\' 1"'$  (1.549 m)   Wt 142 lb 6.4 oz (64.6 kg)   SpO2 98%   BMI 26.91 kg/m  Physical Exam Vitals reviewed.  Constitutional:      General: She is not in acute distress.    Appearance: Normal appearance. She is normal weight. She is not  ill-appearing (Healthy-appearing.  Well-groomed.  Well-nourished.) or toxic-appearing.     Comments: Notable weight loss compared to last visit.  HENT:     Head: Normocephalic and atraumatic.  Neck:     Vascular: No carotid bruit.  Cardiovascular:     Rate and Rhythm: Regular rhythm. Bradycardia present.     Pulses: Normal pulses.     Heart sounds: Normal heart sounds. No murmur heard.    No friction rub. No gallop.  Pulmonary:     Effort: Pulmonary effort is normal. No respiratory distress.     Breath sounds: Normal breath sounds. No wheezing, rhonchi or rales.  Chest:     Chest wall: No tenderness.  Abdominal:     General: Abdomen is flat. Bowel sounds are normal. There is no distension.     Palpations: Abdomen is soft.     Tenderness: There is no abdominal tenderness. There is no guarding or rebound.  Musculoskeletal:        General: Swelling (Mild puffy lower leg swelling.) present.     Cervical back: Normal range of motion and neck supple.  Skin:    General: Skin is warm and dry.  Neurological:     General: No focal deficit present.     Mental Status: She is alert and oriented to person, place, and time.     Gait: Gait abnormal.  Psychiatric:        Mood and Affect: Mood normal.        Behavior: Behavior normal.        Judgment: Judgment normal.     Comments: Some memory loss.  Not a good historian.     Adult ECG Report  Rate: 47 ;  Rhythm: sinus bradycardia and RBBB inferolateral T wave inversions, cannot exclude ischemia. ;  Otherwise no axis, intervals durations.  Narrative Interpretation: Stable  Recent Labs:   04/10/2021: TC 148, TG 90, HDL 55, LDL 76. 09/25/2021: Cr 1.0, K+ 4.7, Hgb 11.4, A1c 5.9.  TSH 1.32 Lab Results  Component Value Date   CHOL 194 02/22/2020   HDL 64 02/22/2020   LDLCALC 114 (H) 02/22/2020   TRIG 91 02/22/2020   CHOLHDL 3.0 02/22/2020   Lab Results  Component Value Date   CREATININE 1.04 (H) 01/10/2021   BUN 16 01/10/2021   NA 136  01/10/2021   K 3.2 (L) 01/10/2021   CL 97 (L) 01/10/2021   CO2 29 01/10/2021      Latest Ref Rng & Units 01/09/2021    4:42 AM 01/08/2021    1:05 PM 02/22/2020    9:46 AM  CBC  WBC 4.0 - 10.5 K/uL 5.0  4.9  5.6   Hemoglobin 12.0 - 15.0 g/dL 12.6  13.5  12.5   Hematocrit 36.0 - 46.0 % 39.4  42.9  36.8   Platelets 150 - 400 K/uL 226  253  265     No results found for: "HGBA1C" Lab Results  Component Value Date   TSH 1.573 12/10/2019    ================================================== I spent a total of 22 minutes with the patient spent in direct patient consultation.  Additional time spent with chart  review  / charting (studies, outside notes, etc): 20 min Total Time: 42 min  Current medicines are reviewed at length with the patient today.  (+/- concerns) N/A  Notice: This dictation was prepared with Dragon dictation along with smart phrase technology. Any transcriptional errors that result from this process are unintentional and may not be corrected upon review.  Studies Ordered:   Orders Placed This Encounter  Procedures   EKG 12-Lead   Meds ordered this encounter  Medications   metoprolol succinate (TOPROL-XL) 25 MG 24 hr tablet    Sig: Take 1 tablet (25 mg total) by mouth daily. Stop taking after 2 weeks starting 0n 02/18/22)    Dispense:  90 tablet    Refill:  1    Patient Instructions / Medication Changes & Studies & Tests Ordered   Patient Instructions  Other Instructions   Take this with you to your next primary appt. - Dr Ellyn Hack recommends you have this lab added to labs- SED RATE ( ESR) , C-reactive protein (C-RP), liver function panel ( Hepatic Panel)     Inform your Eye doctor - you are taking Amiodarone   Medication Instructions:    Weaning and then stop taking Metoprolol ---For the next 2 weeks ( 14 days)  take 1/2 tablet of 25 mg ( total 12.5 mg)  then stop    *If you need a refill on your cardiac medications before your next appointment,  please call your pharmacy*   Lab Work:pease have done at primary with next draw- primary will order  ESR CRP Hepatic panel      Testing/Procedures: Not needed   Follow-Up: At [facility], you and your health needs are our priority.  As part of our continuing mission to provide you with exceptional heart care, we have created designated Provider Care Teams.  These Care Teams include your primary Cardiologist (physician) and Advanced Practice Providers (APPs -  Physician Assistants and Nurse Practitioners) who all work together to provide you with the care you need, when you need it.     Your next appointment:   12 month(s)  The format for your next appointment:   In Person  Provider:   Glenetta Hew, MD    Other Instructions   Take this with you to your next primary appt. - Dr Ellyn Hack recommends you have this lab added to labs- SED RATE ( ESR) , C-reactive protein (C-RP), liver function panel ( Hepatic Panel)     Inform your Eye doctor - you are taking Amiodarone      Leonie Man, MD, MS Glenetta Hew, M.D., M.S. Interventional Cardiologist  [place]  Pager # [phone] Phone # [phone] [address] [place], [place] [address]   Thank you for choosing [facility] [place]!!

## 2022-02-24 ENCOUNTER — Encounter: Payer: Self-pay | Admitting: Cardiology

## 2022-02-24 NOTE — Progress Notes (Incomplete)
Primary Care Provider: Mayra Neer, MD [place] Cardiologist: Glenetta Hew, MD Electrophysiologist: None  Clinic Note: No chief complaint on file.  ===================================  ASSESSMENT/PLAN   Problem List Items Addressed This Visit       Cardiology Problems   Paroxysmal atrial fibrillation [facility]): CHA2DS2-VASc score 8 - Primary (Chronic)   Relevant Medications   furosemide (LASIX) 40 MG tablet   metoprolol succinate (TOPROL-XL) 25 MG 24 hr tablet   Other Relevant Orders   EKG 12-Lead (Completed)    ===================================  HPI:    Maureen [name] is a 82 y.o. female with a PMH notable for PAF and HFrEF (also history of meningioma with seizures) below who presents today for almost 2-year follow-up.  Maureen [name] was last seen in April 2022: She is doing well.  No further symptoms of A-fib.  Try to work on getting in better shape.  Noted some exertional dyspnea if she walks for a while, but was improving.  Building up her level of exercise tolerance.  Had some lightheadedness and dizziness with near syncope but related to hypoglycemia.  Improved after eating.  No chest pain chest pressure or dyspnea with rest or exertion.  No heart failure symptoms.  Had some mild paresthesias from residual stroke symptoms.  Recent Hospitalizations: Last hospitalization was December 2022 for influenza.  Reviewed  CV studies:    The following studies were reviewed today: (if available, images/films reviewed: From Epic Chart or Care Everywhere) No recent studies:  Interval History:   Maureen [name] returns here today for a follow-up overall doing pretty well.  She says in the last almost 2 years she has had maybe 1 or 2 episodes of A-fib that lasted me to be couple hours or so.  Usually these are associated with when she is try to care for her husband who is being somewhat difficult.  He was recently diagnosed with dementia and it causes her quite a bit  of stress. Otherwise she is off and on limited dizzy sometimes with dizziness that seems to limit her exercise routines. She has rarely used as needed Lasix may be use up to 2-3 times a month.  No bleeding issues on Eliquis.  Maybe little occasional exercise intolerance would like to get up and go.  No chest pain pressure or tightness with rest or exertion.  Mild exercise intolerance with exertional dyspnea but overall stable.  Trying to stay active.  Otherwise pretty stable from a cardiac standpoint.  CV Review of Symptoms (Summary): Cardiovascular ROS: positive for - dyspnea on exertion, irregular heartbeat, palpitations, and these episodes last maybe 2 to 3 hours at the most and are usually associated with social stress.  Off-and-on dizziness.  Well-controlled edema.  Rare use of PRN Lasix.  Rare bruising.. negative for - chest pain, orthopnea, paroxysmal nocturnal dyspnea, rapid heart rate, shortness of breath, or syncope or near syncope, TIA or amaurosis fugax, claudication.  Melena, hematochezia hematuria or epistaxis. Many of her symptoms are associated with stress and caring for her husband.   REVIEWED OF SYSTEMS   Review of Systems  Constitutional:  Positive for weight loss. Negative for malaise/fatigue (Much better exercise tolerance.).  HENT:  Negative for congestion.   Respiratory:  Negative for cough and shortness of breath.   Cardiovascular:  Negative for chest pain and palpitations.  Gastrointestinal:  Negative for blood in stool and melena.  Genitourinary:  Negative for hematuria.  Musculoskeletal:  Positive for joint pain and myalgias.  Neurological:  Positive for weakness (Legs sometimes give out on her.). Negative for dizziness and focal weakness.  Psychiatric/Behavioral:  Positive for memory loss. Negative for depression. The patient is nervous/anxious. The patient does not have insomnia.        She is under some stress as a caregiver for her husband with some  dementia. She herself has some memory loss and confusion.   I have reviewed and (if needed) personally updated the patient's problem list, medications, allergies, past medical and surgical history, social and family history.   PAST MEDICAL HISTORY   Past Medical History:  Diagnosis Date  . Atrial fibrillation ([place])   . Benign tumor of meninges ([place])   . Chronic combined systolic and diastolic CHF, NYHA class 2 and ACA/AHA stage C 01/2018   Exacerbated by A. fib; EF 35 to 40% with global HK.  Reduced cardiac output and index (3.2/1.9).   -Associated moderate pulmonary hypertension-(mean PA P 38 mmHg)  . GERD (gastroesophageal reflux disease)    occ. in past  . Headache(784.0)   . HTN (hypertension), benign    pt. states no hypertension  . NICM (nonischemic cardiomyopathy) ([place]) 01/2018   Echo with EF of 35 to 40%.  Global HK.  Mild to moderate MR.  Mild nonobstructive CAD by cath.  Cardiac output/index 3.2/1.9  . Pneumonia    ? as child  . PONV (postoperative nausea and vomiting)   . Seizures ([place])    positive for HBP    PAST SURGICAL HISTORY   Past Surgical History:  Procedure Laterality Date  . CARDIOVERSION N/A 02/24/2018   Procedure: CARDIOVERSION;  Surgeon: Sueanne Margarita, MD;  Location: [facility] ENDOSCOPY;  Service: Cardiovascular;  Laterality: N/A;  . CATARACT EXTRACTION, BILATERAL Bilateral   . CHOLECYSTECTOMY    . COLONOSCOPY WITH PROPOFOL N/A 11/30/2012   Procedure: COLONOSCOPY WITH PROPOFOL;  Surgeon: Garlan Fair, MD;  Location: [facility] ENDOSCOPY;  Service: Endoscopy;  Laterality: N/A;  . ESOPHAGOGASTRODUODENOSCOPY (EGD) WITH PROPOFOL N/A 11/30/2012   Procedure: ESOPHAGOGASTRODUODENOSCOPY (EGD) WITH PROPOFOL;  Surgeon: Garlan Fair, MD;  Location: [facility] ENDOSCOPY;  Service: Endoscopy;  Laterality: N/A;  . ESOPHAGOGASTRODUODENOSCOPY (EGD) WITH PROPOFOL N/A 03/14/2015   Procedure: ESOPHAGOGASTRODUODENOSCOPY (EGD) WITH PROPOFOL;  Surgeon: Arta Silence, MD;  Location: [facility]  ENDOSCOPY;  Service: Endoscopy;  Laterality: N/A;  . EUS N/A 01/05/2013   Procedure: FULL UPPER ENDOSCOPIC ULTRASOUND (EUS) RADIAL;  Surgeon: Arta Silence, MD;  Location: [facility] ENDOSCOPY;  Service: Endoscopy;  Laterality: N/A;  EUS with possible FNA  . EUS N/A 03/14/2015   Procedure: UPPER ENDOSCOPIC ULTRASOUND (EUS) RADIAL;  Surgeon: Arta Silence, MD;  Location: [facility] ENDOSCOPY;  Service: Endoscopy;  Laterality: N/A;  . EYE SURGERY Left    "few weeks ago scar tissue laser surgery"  . FINE NEEDLE ASPIRATION N/A 01/05/2013   Procedure: FINE NEEDLE ASPIRATION (FNA) RADIAL;  Surgeon: Arta Silence, MD;  Location: [facility] ENDOSCOPY;  Service: Endoscopy;  Laterality: N/A;  . KNEE ARTHROSCOPY Left   . [place]  12/1990  . RIGHT/LEFT HEART CATH AND CORONARY ANGIOGRAPHY N/A 02/22/2018   Procedure: RIGHT/LEFT HEART CATH AND CORONARY ANGIOGRAPHY;  Surgeon: Larey Dresser, MD;  Location: [facility] INVASIVE CV LAB;;  Nonobstructive CAD.  Mild to moderate pulmonary hypertension (PA P 47/24 mmHg-mean 38 mmHg.  PCWP 22 mmHg with LVEDP 24 mmHg. CO/CI = 3.2/1.9  . TEE WITHOUT CARDIOVERSION N/A 02/24/2018   Procedure: TRANSESOPHAGEAL ECHOCARDIOGRAM (TEE) - WITH DCCV;  Surgeon: Sueanne Margarita, MD;  Location: [facility] ENDOSCOPY;; EF 35-40%.  Global HK.  No R WMA. Mild AI, Mild-Mod MR. Severe LA dilation (no LAA thrombus).  Severely reduced RV function.  Mild RA dilation --successful DCCV  . TRANSTHORACIC ECHOCARDIOGRAM  02/18/2018   EF 35 to 40%.  Unable to assess diastolic function because of A. fib.  No obvious regional wall motion abnormality.  Global hypokinesis.  Severe LA dilation.  Mild-moderate MR.  Mild AI without AS  . TRANSTHORACIC ECHOCARDIOGRAM  11/2019   CHF with A. fib RVR:  EF 45 to 50%.  Mildly reduced function.  GR 3 DD with elevated LAP.  Estimated RVP/PAP 72 to 73 mmHg.  Moderate MR and TR.  Mild to moderate PR.  Suggested CVP 15 mmHg.  Interestingly, despite the severe elevated pressures.  Both right  and left atria were both normal size.  Marland Kitchen VAGINAL HYSTERECTOMY  1983    Immunization History  Administered Date(s) Administered  . PFIZER(Purple Top)SARS-COV-2 Vaccination 02/11/2019, 03/04/2019    MEDICATIONS/ALLERGIES   Current Meds  Medication Sig  . amiodarone (PACERONE) 200 MG tablet Take 1 tablet (200 mg total) by mouth daily.  Marland Kitchen apixaban (ELIQUIS) 5 MG TABS tablet Take 1 tablet (5 mg total) by mouth 2 (two) times daily.  . Calcium-Magnesium-Zinc (CAL-MAG-ZINC PO) Take 1 tablet by mouth daily at 12 noon.  . Cholecalciferol (VITAMIN D-3) 25 MCG (1000 UT) CAPS Take 1,000 Units by mouth daily. Take 1 capsule daily  . furosemide (LASIX) 40 MG tablet 1 tablet Orally once a day if needed  . losartan (COZAAR) 50 MG tablet Take 1 tablet (50 mg total) by mouth daily. SCHEDULE OFFICE VISIT FOR FUTURE REFILLS.  . Multiple Vitamin (MULTIVITAMIN WITH MINERALS) TABS tablet Take 1 tablet by mouth daily.  . pravastatin (PRAVACHOL) 40 MG tablet Take 40 mg by mouth daily.   . [DISCONTINUED] metoprolol succinate (TOPROL-XL) 25 MG 24 hr tablet Take 1 tablet (25 mg total) by mouth daily.    Allergies  Allergen Reactions  . Dilantin [Phenytoin Sodium Extended]      Stupor, ataxia.   Marland Kitchen Hydrocodone Nausea And Vomiting    SOCIAL HISTORY/FAMILY HISTORY   Reviewed in Epic:  Pertinent findings:  Social History   Tobacco Use  . Smoking status: Never  . Smokeless tobacco: Never  Vaping Use  . Vaping Use: Never used  Substance Use Topics  . Alcohol use: Not Currently    Comment: once monthly  . Drug use: No   Social History   Social History Narrative   She retired in 1995 from [employer]. Lives at home with her husband, Jan. They have a son age 22 and a  son age 29. Cafffeine once daily. a week and alcohol once a month. Denies tobacco and illicit drug use.    Patient is left handed.   Patient has a Master's degree.    OBJCTIVE -PE, EKG, labs   Wt Readings from Last 3  Encounters:  02/17/22 142 lb 6.4 oz (64.6 kg)  09/16/21 142 lb (64.4 kg)  01/10/21 137 lb 1.6 oz (62.2 kg)  04/25/2020: 152 lb  Physical Exam: BP (!) 140/74   Pulse (!) 47   Ht '5\' 1"'$  (1.549 m)   Wt 142 lb 6.4 oz (64.6 kg)   SpO2 98%   BMI 26.91 kg/m  Physical Exam Vitals reviewed.  Constitutional:      General: She is not in acute distress.    Appearance: Normal appearance. She is normal weight. She is not ill-appearing (Healthy-appearing.  Well-groomed.  Well-nourished.) or toxic-appearing.     Comments: Notable weight loss compared to last visit.  HENT:     Head: Normocephalic and atraumatic.  Neck:     Vascular: No carotid bruit.  Cardiovascular:     Rate and Rhythm: Regular rhythm. Bradycardia present.     Pulses: Normal pulses.     Heart sounds: Normal heart sounds. No murmur heard.    No friction rub. No gallop.  Pulmonary:     Effort: Pulmonary effort is normal. No respiratory distress.     Breath sounds: Normal breath sounds. No wheezing, rhonchi or rales.  Chest:     Chest wall: No tenderness.  Abdominal:     General: Abdomen is flat. Bowel sounds are normal. There is no distension.     Palpations: Abdomen is soft.     Tenderness: There is no abdominal tenderness. There is no guarding or rebound.  Musculoskeletal:        General: Swelling (Mild puffy lower leg swelling.) present.     Cervical back: Normal range of motion and neck supple.  Skin:    General: Skin is warm and dry.  Neurological:     General: No focal deficit present.     Mental Status: She is alert and oriented to person, place, and time.     Gait: Gait abnormal.  Psychiatric:        Mood and Affect: Mood normal.        Behavior: Behavior normal.        Judgment: Judgment normal.     Comments: Some memory loss.  Not a good historian.     Adult ECG Report  Rate: 47 ;  Rhythm: sinus bradycardia and RBBB inferolateral T wave inversions, cannot exclude ischemia. ;  Otherwise no axis, intervals  durations.  Narrative Interpretation: Stable  Recent Labs:   04/10/2021: TC 148, TG 90, HDL 55, LDL 76. 09/25/2021: Cr 1.0, K+ 4.7, Hgb 11.4, A1c 5.9.  TSH 1.32 Lab Results  Component Value Date   CHOL 194 02/22/2020   HDL 64 02/22/2020   LDLCALC 114 (H) 02/22/2020   TRIG 91 02/22/2020   CHOLHDL 3.0 02/22/2020   Lab Results  Component Value Date   CREATININE 1.04 (H) 01/10/2021   BUN 16 01/10/2021   NA 136 01/10/2021   K 3.2 (L) 01/10/2021   CL 97 (L) 01/10/2021   CO2 29 01/10/2021      Latest Ref Rng & Units 01/09/2021    4:42 AM 01/08/2021    1:05 PM 02/22/2020    9:46 AM  CBC  WBC 4.0 - 10.5 K/uL 5.0  4.9  5.6   Hemoglobin 12.0 - 15.0 g/dL 12.6  13.5  12.5   Hematocrit 36.0 - 46.0 % 39.4  42.9  36.8   Platelets 150 - 400 K/uL 226  253  265     No results found for: "HGBA1C" Lab Results  Component Value Date   TSH 1.573 12/10/2019    ================================================== I spent a total of 22 minutes with the patient spent in direct patient consultation.  Additional time spent with chart review  / charting (studies, outside notes, etc): 20 min Total Time: 42 min  Current medicines are reviewed at length with the patient today.  (+/- concerns) N/A  Notice: This dictation was prepared with Dragon dictation along with smart phrase technology. Any transcriptional errors that result from this process are unintentional and may not be corrected upon review.  Studies Ordered:   Orders  Placed This Encounter  Procedures  . EKG 12-Lead   Meds ordered this encounter  Medications  . metoprolol succinate (TOPROL-XL) 25 MG 24 hr tablet    Sig: Take 1 tablet (25 mg total) by mouth daily. Stop taking after 2 weeks starting 0n 02/18/22)    Dispense:  90 tablet    Refill:  1    Patient Instructions / Medication Changes & Studies & Tests Ordered   Patient Instructions  Other Instructions   Take this with you to your next primary appt. - Dr Ellyn Hack recommends  you have this lab added to labs- SED RATE ( ESR) , C-reactive protein (C-RP), liver function panel ( Hepatic Panel)     Inform your Eye doctor - you are taking Amiodarone   Medication Instructions:    Weaning and then stop taking Metoprolol ---For the next 2 weeks ( 14 days)  take 1/2 tablet of 25 mg ( total 12.5 mg)  then stop    *If you need a refill on your cardiac medications before your next appointment, please call your pharmacy*   Lab Work:pease have done at primary with next draw- primary will order  ESR CRP Hepatic panel      Testing/Procedures: Not needed   Follow-Up: At [facility], you and your health needs are our priority.  As part of our continuing mission to provide you with exceptional heart care, we have created designated Provider Care Teams.  These Care Teams include your primary Cardiologist (physician) and Advanced Practice Providers (APPs -  Physician Assistants and Nurse Practitioners) who all work together to provide you with the care you need, when you need it.     Your next appointment:   12 month(s)  The format for your next appointment:   In Person  Provider:   Glenetta Hew, MD    Other Instructions   Take this with you to your next primary appt. - Dr Ellyn Hack recommends you have this lab added to labs- SED RATE ( ESR) , C-reactive protein (C-RP), liver function panel ( Hepatic Panel)     Inform your Eye doctor - you are taking Amiodarone      Leonie Man, MD, MS Glenetta Hew, M.D., M.S. Interventional Cardiologist  [place]  Pager # [phone] Phone # [phone] [address] [place], [place] [address]   Thank you for choosing [facility]!!

## 2022-02-25 DIAGNOSIS — R001 Bradycardia, unspecified: Secondary | ICD-10-CM | POA: Insufficient documentation

## 2022-02-25 NOTE — Assessment & Plan Note (Signed)
BP is little elevated today, but minimally for Toprol and we may need to titrate up losartan to 100 if pressures increase.  But since she is having some dizziness will hold off for now.  With orthostatic dizziness I would not be more aggressive with diuresis.

## 2022-02-25 NOTE — Assessment & Plan Note (Signed)
Relatively rare episodes that are not very long lasting.  Pretty well-controlled on amiodarone. Tolerating Eliquis without any bleeding issues.  Okay to hold Eliquis if necessary.  The episodes of A-fib I recommended that she actually double up the amiodarone for the day and see if that brings it.

## 2022-02-25 NOTE — Assessment & Plan Note (Signed)
Now on DOAC, after adjusting her antiseizure medications.  She is on Eliquis and tolerating it well.  With no real recurrent episodes recently, but you fine to hold 2 to 3 days preop for procedures or surgeries.  High risk neural or organ biopsy procedures would probably hold for 3 days but otherwise 2 days for low risk procedures.

## 2022-02-25 NOTE — Assessment & Plan Note (Signed)
Patient is on amiodarone.  Needs to have annual LFTs, TFTs checked.  Annual eye exam.  Will also check ESR and CRP for surgical history of pulmonary fibrosis (markers of inflammation)  We will add CRP and ESR LFTs and LFTs to follow-up labs with PCP in March.

## 2022-02-25 NOTE — Assessment & Plan Note (Signed)
Tolerating pravastatin relatively well.  Most recent LDL was down to 76 from 114.  Should be due for follow-up labs coming up in March.  Not unreasonable to set a target of less than 70 but 76 is pretty close to.  May want to consider adding Zetia to get below 70.  Defer to PCP.

## 2022-02-25 NOTE — Assessment & Plan Note (Signed)
Minimal CHF symptoms but usually exacerbated by episodes of A-fib.  Maybe 2 times a month she will use her Lasix.  No associated PND orthopnea.  He is euvolemic on exam.  She is bradycardic and has had some dizzy spells.  We will wean off beta-blocker but continue ARB and PRN diuretic. She is already on amiodarone for rhythm control.

## 2022-02-25 NOTE — Assessment & Plan Note (Addendum)
Mildly reduced EF.  Unfortunately we will have to stop the beta-blocker because of bradycardia and dizziness.  Will continue ARB.  Pretty much euvolemic as noted.

## 2022-02-25 NOTE — Assessment & Plan Note (Signed)
On amiodarone and Toprol with heart rates in the 40s.  We are using amiodarone for rhythm control.  The low-dose Toprol was essentially for additional rate control but also for cardiomyopathy/CHF.  However she is pretty stable from that standpoint would prefer amiodarone for longer-term rhythm control.  As such we will have her wean off Toprol.  As this happens, she may need to increase to 100 mg of losartan.  This can be evaluated when she sees her PCP in March.

## 2022-02-28 ENCOUNTER — Telehealth: Payer: Self-pay | Admitting: Cardiology

## 2022-02-28 ENCOUNTER — Encounter: Payer: Self-pay | Admitting: Cardiology

## 2022-02-28 NOTE — Telephone Encounter (Signed)
Pt c/o medication issue:  1. Name of Medication:  metoprolol succinate (TOPROL-XL) 25 MG 24 hr tablet  2. How are you currently taking this medication (dosage and times per day)?  1/2 tablet daily   3. Are you having a reaction (difficulty breathing--STAT)?   4. What is your medication issue?    See 2/09 patient message--Patient calling in to reiterate that decreasing Metoprolol has caused her to become more tired and fatigued. She also mentions feeling nervous and anxious.

## 2022-02-28 NOTE — Telephone Encounter (Signed)
See telephone message 02/28/22

## 2022-02-28 NOTE — Telephone Encounter (Signed)
The logic here seems somewhat counterintuitive.  The reason for weaning off the beta-blocker was because of slow heart rate and brain fog.  However I am fine that she was go back on the current dose keep on the heart rate but if she feels better then I guess we can stay there.  Obviously we have her heart rate getting too slow or not going fast enough.  If she still feels the same on current dose of beta-blocker again, then I would suggest is probably something else.  Glenetta Hew, MD

## 2022-02-28 NOTE — Telephone Encounter (Signed)
Called and spoke with patient about Dr. Allison Quarry recommendation to go back on her previous dose of metoprolol to see if this alleviates her symptoms. Asked patient to continue to track HR and BP over the weekend and to call us on Monday if symptoms have not improved.  Patient verbalized understanding and agrees to plan.  Reiterated patient is to call 911 if she feels like her condition is deteriorating rapidly over the weekend.

## 2022-02-28 NOTE — Telephone Encounter (Signed)
After reviewing chart, called patient to inquire whether she was still taking keppra 500 mg BID which was listed as one of her meds at her 09/11/21 visit with Debbora Presto, NP. Patient confirms that she is still taking it and she has taken it today. She also states she has had no recent seizures. At that visit, she also reported "brain fog" and patient confirms that what she is currently experiencing is similar to what she was experiencing then. She states her primary complaint at this point is fatigue. Forwarded to Dr. Allison Quarry nurse Ivin Booty.

## 2022-02-28 NOTE — Telephone Encounter (Signed)
Called and spoke to patient about her symptoms. Patient correctly verbalizes that her last appointment with Dr. Ellyn Hack was 02/17/22 and at that time he instructed her to wean down her metoprolol dose. She states she was told to take a half tab of 25 mg metoprolol for 2 weeks and then stop. She states she has felt fatigued and "confused" for most of the past week. Patient is able to tell me her DOB, the names of her medications and dosages. She denies any speech, balance difficulties, new hemispheric weakness, vision changes, fever, SOB or chest pain. She endorses taking her medication as ordered. She has not been tracking her HR or BP since her last visit, took them while on the phone with me and reported that SpO2 was 98, HR was 58 and BP was 176/90. At her visit on 02/17/22 HR was 47 and BP was 140/74.   Patient states she would prefer to go back on her normal dose of metoprolol. Advised patient to continue to track HR and BP and that I would reach out to Dr. Ellyn Hack. Instructed patient to rest and not exert herself too much over the weekend and that if symptoms worsen over the weekend or progress to palpitations w/ chest pain or SOB or stroke symptoms to call 911 immediately. Patient verbalizes understanding.

## 2022-03-03 ENCOUNTER — Telehealth: Payer: Self-pay | Admitting: Family Medicine

## 2022-03-03 ENCOUNTER — Encounter: Payer: Self-pay | Admitting: Family Medicine

## 2022-03-03 ENCOUNTER — Ambulatory Visit: Payer: Medicare PPO | Admitting: Family Medicine

## 2022-03-03 VITALS — BP 106/69 | HR 50 | Ht 61.0 in | Wt 139.5 lb

## 2022-03-03 DIAGNOSIS — H538 Other visual disturbances: Secondary | ICD-10-CM

## 2022-03-03 DIAGNOSIS — I10 Essential (primary) hypertension: Secondary | ICD-10-CM | POA: Diagnosis not present

## 2022-03-03 DIAGNOSIS — D329 Benign neoplasm of meninges, unspecified: Secondary | ICD-10-CM | POA: Diagnosis not present

## 2022-03-03 DIAGNOSIS — G4733 Obstructive sleep apnea (adult) (pediatric): Secondary | ICD-10-CM

## 2022-03-03 DIAGNOSIS — R569 Unspecified convulsions: Secondary | ICD-10-CM | POA: Diagnosis not present

## 2022-03-03 DIAGNOSIS — R42 Dizziness and giddiness: Secondary | ICD-10-CM

## 2022-03-03 NOTE — Telephone Encounter (Signed)
Pt experiencing high blood pressure(Cardiologist informed her not based on medication he prescribes and advised her to f/u with Amy, NP)pt also having blurred vision and extreme confusion, please call pt to discuss.

## 2022-03-03 NOTE — Patient Instructions (Signed)
Below is our plan:  We will continue levetiracetam 563m twice daily. We can consider discontinuation in the future but will require that you not drive for 6 months. I will update some blood work and your MRI of the brain to look for any neurological concerns with your symptoms. We can also do an EEG if you change your mind. Please get in with your PCP and make sure they know what is going on.   Please make sure you are staying well hydrated. I recommend 50-60 ounces daily. Well balanced diet and regular exercise encouraged. Consistent sleep schedule with 6-8 hours recommended.   Please continue follow up with care team as directed.   Follow up with me in August as scheduled, sooner if needed pending workup  You may receive a survey regarding today's visit. I encourage you to leave honest feed back as I do use this information to improve patient care. Thank you for seeing me today!

## 2022-03-03 NOTE — Progress Notes (Signed)
PATIENT: Maureen [name] DOB: [birth year]  REASON FOR VISIT: follow up HISTORY FROM: patient  Chief Complaint  Patient presents with   Room 2    Pt is here Alone. Pt states that she has weakness all over her body. Pt states memory loss. Pt states that her left hand has tremors. Pt states that she has low blood sugar. Pt states that she is extra thirsty and craving sweets.     HISTORY OF PRESENT ILLNESS:  03/03/22 ALL:  Maureen [name] returns for an acute visit to discuss discontinuation of levetiracetam and an episode of confusion and intermittent shakiness and blurred vision.   She reports blurred vision has been present for the past 3 months. She was seen by ophthalmology and reports normal eye exam. She has been adjusting blood pressure medications due to low BP and pulse. She also reports a low blood sugar, today. She did not check CBG but felt this to be most likely as symptoms improved after eating. She has had similar events in the past. She is prediabetic.   She also reports one episode of confusion about a week ago. She was trying to take her husband to an appt and felt that she couldn't remember how to open the car door. She feels symptoms were present for about 5 minutes. She felt dizzy and shaky at that time. She denies obvious seizure activity. No post ictal state.   She reports pulse has been as low as 40 over the past 1-2 weeks. Normal for her is 60-70. Her blood pressure has been around 100-110/70-80. This morning she reported BP reading of 170/77. In office it is 106/69.     She last had MRI imaging in 2020 showing enlargement of two meningiomas with new frontal tumor noted. She has not wanted to update MRI imaging since. CT 12/2020 showed meningiomas were stable. She has continued levetiracetam since 2020 (previously on carbamazepine). Last known seizure was in 2002. She is tolerating med well. She does have some brain fog.   She was diagnosed with severe OSA in 2020. She declines  treatment. She does endorse significant stress. She is caring for her husband who is ill with dementia. She is trying to get him in an adult daycare a couple times a week. She is currently driving. No obvious difficulty. She is drinking about 24 ounces of water daily.   09/16/2021 ALL: Maureen [name] returns for follow up for seizures, hx meningioma and OSA. She continues levetiracetam 524m BID. No recent seizure activity. She is tolerating levetiracetam without obvious adverse effects.   She reports episodes of feeling weak, she feels like she has brain fog and has shaking all over, more so in hands. She notes this may happen 1-2 times a month and some months it does not happen. She reports with some events, she feels clammy. She feels events last anywhere from a couple minutes to about 15 minutes. She reports that eating a snack makes her feel better. She will usually drink orange juice. No LOC or loss of bowel/bladder control. She is prediabetic. She has follow up scheduled with PCP.   She does have left sided tremor noted with some action over the past few years. It may have worsened slightly. She does feel that she has a harder time witting or painting. No resting tremor.   CT 12/2020 showed multiple meningiomas which were unchanged from MRI 03/2018. She is not interested in updating MRI.   09/19/2020 ALL: Maureen Janusreturns for follow up for  seizures, history of meningioma and OSA. She continues levetiracetam 510m BID. No recent seizure activity.  She is tolerating medication well with no obvious adverse effects.   She continues to decline OSA treatment.   MRI was ordered last year for monitoring of meningiomas.  Last MRI in March 2020 did show enlargement of previous right and left frontal meningiomas and new right frontal lesion. She did not have imaging completed. She reports going to the wrong imaging center and can not remember if she ever rescheduled imaging.   06/28/2019 ALL: Maureen LUCis a 82y.o.  female here today for follow up for history of epilepsy. She continues levetiracetam 500 BID. She denies any seizure activity since last being seen. She does mention an episode three months ago where she had "tightening" of the muscles of right lower extremity for about 30 minutes. She was lying in bed and noted symptoms. She was completely alert. She reports her husband was asleep so she did not want to make noises but could have talked if she needed to. She tried rolling over th her left side but symptoms continued. She states spasms spontaneously resolved. She does report missing several doses (unsure exactly how long she had been out of medication) just prior to event. She also reports not taking levetiracetam for the past week due to not having refills. No other seizure concerns. She is tolerating medicaton well. She does have persistent aching pain of both hands. She feels it is related to arthritis. She uses a topical cream which helps.   She was diagnosed with sleep apnea last year. AHI 40/hr. She does not wish to use CPAP therapy. She reports reading about sleep apnea and does not wish to start therapy. She is concerned that her husband "has gotten dependent" on his CPAP and she is adamant that she will not use it.   HISTORY: (copied from Dr Dohmeier's note on 03/03/2018)  Maureen [name] an 82year old female with a history of seizures and meningiomata. This patient is a left-handed Caucasian married female with a history of a left craniotomy for a left parasagittal meningioma resection on 01/04/1991.  Recent admission to hospital ( 02-22-2018)  for new onset atrial fibrillation, echo and cardioversion.  Medications were changed due to need for anticoagulation, and the patient placed on Keppra, Leviracetam.  She was weaned off Carbamazepine- completely .  She was only during her hs hospital stay placed on amiodarone,  this needed not  to be continued after succesfull cardioversion. She remains on  TOPROL, feels tired.   She is here for her routine yearly seizure follow up , but needs a sleep study for atrial fib risk work up. Atrial hypertrophy.   She remains hoarse since intubation.    Interval history from 11/10/2016 I have the pleasure of seeing Maureen. GGeanna Bilotti [name] today, meanwhile 82years old, And followed in our practice for a remote history of seizures.Patient is tolerating Tegretol XL 4 years very well, she operates a motor vehicle without difficulty had no accidents. She denies any recent seizure events. The last possible seizure was 2002, years after her craniotomy. She reports concern of recurrence of meningeomata. She had her last MRI 20 month ago. Reviewed today the MRI study from January 2017, which documented multiple small meningiomata. These measure in the millimeter range. They could however have grown in the meanwhile. I will repeat this brain MRI study in November or December of this year   08-03-2015 MM. Maureen [name]  a 82 year old female with a history of seizures and meningiomata. She returns today for follow-up. She states that she is doing well. She continues to take Tegretol XL and tolerating it well. Denies any seizure events. She operates a Teacher, music without difficulty. Denies any changes with her gait or balance. Able to complete all ADLs independently. She returns today for an evaluation.   HISTORY 07/11/14 (MM): Maureen [name] is a 82 year old female with a history of seizures and meningioma. She returns today for follow-up. Patient is currently taken Tegretol XL and tolerating it well. She reports that she is not had a seizure since the last visit. She is able to complete all ADLs independently. She operates a Teacher, music without difficulty. Since the last visit she does notice a mild tremor in the left hand is intermittent. She denies any changes with her gait or balance. She continues to take calcium and vitamin D supplementation for osteopenia She returns today  for an evaluation.   HISTORY 05/26/13 (CD):   Maureen [name] is a 82 y.o. female seen at [facility] here as a transfer of care from [facility] ([place] She is a retired Tourist information centre manager , with a Scientist, water quality , taught elementary school.  Patient has been followed him as her primary care provider by Dr. Osvaldo Human, now Dr Serita Grammes.  This patient is a left-handed Caucasian married female with a history of a left craniotomy for a left parasagittal meningioma resection on 01/04/1991.  She has been followed by Dr. Morene Antu since July 1992 . Her last seizure is now over 10 years ago.   Preoperatively she was treated with Decadron and Phenobarbital after she developed a seizure , focal to the right body, this let to an evaluation and the discovery of the meningeoma. After the surgery she was started on Dilantin but developed a rash and became ataxic to this medication was discontinued. She continued for while to have abnormal sensations in her right leg ans sometimes right arm , which in most respects were identified a simple partial seizures. Inspired Dr. Tressia Danas  last EEG was normal. She continued to the right foot and leg motor seizures that she describes as a spasm of the right foot.  Dr. Erling Cruz placed onTegretol since she had marked improvement. At the time of his last visit on 08/06/2009 the patient took brand nameTegretol-XR 200 mg twice a day she had a slight decrease in serum Na levels and 2008 underwent DEXA scan,  which was normal. She still reported about once a year  A symptom of leg spasm, no seizures per se. Possible  symptom of spasm beginning in the right  Foot and ankle up going to the right knee and then radiating downwards to the foot with a duration of less than a minute. A repeated DEXA scan revealed ion13 April 2014 newly diagnosed severe osteopenia. She begun taking vitamin D and calcium, recently in the last 6 month.     REVIEW OF SYSTEMS: Out of a complete 14 system review of symptoms, the patient  complains only of the following symptoms, seizure, pain of both hands and all other reviewed systems are negative.  ALLERGIES: Allergies  Allergen Reactions   Dilantin [Phenytoin Sodium Extended]      Stupor, ataxia.    Hydrocodone Nausea And Vomiting    HOME MEDICATIONS: Outpatient Medications Prior to Visit  Medication Sig Dispense Refill   amiodarone (PACERONE) 200 MG tablet Take 1 tablet (200 mg total) by mouth daily. 30 tablet 6  apixaban (ELIQUIS) 5 MG TABS tablet Take 1 tablet (5 mg total) by mouth 2 (two) times daily. 180 tablet 1   furosemide (LASIX) 40 MG tablet 1 tablet Orally once a day if needed     levETIRAcetam (KEPPRA) 500 MG tablet Take 500 mg by mouth 2 (two) times daily.     losartan (COZAAR) 50 MG tablet Take 1 tablet (50 mg total) by mouth daily. SCHEDULE OFFICE VISIT FOR FUTURE REFILLS. 30 tablet 0   metoprolol succinate (TOPROL-XL) 25 MG 24 hr tablet Take 1 tablet (25 mg total) by mouth daily. Stop taking after 2 weeks starting 0n 02/18/22) 90 tablet 1   Multiple Vitamin (MULTIVITAMIN WITH MINERALS) TABS tablet Take 1 tablet by mouth daily.     pravastatin (PRAVACHOL) 40 MG tablet Take 40 mg by mouth daily.      Calcium-Magnesium-Zinc (CAL-MAG-ZINC PO) Take 1 tablet by mouth daily at 12 noon. (Patient not taking: Reported on 03/03/2022)     Cholecalciferol (VITAMIN D-3) 25 MCG (1000 UT) CAPS Take 1,000 Units by mouth daily. Take 1 capsule daily (Patient not taking: Reported on 03/03/2022)     No facility-administered medications prior to visit.    PAST MEDICAL HISTORY: Past Medical History:  Diagnosis Date   Atrial fibrillation (HCC)    Benign tumor of meninges (HCC)    Chronic combined systolic and diastolic CHF, NYHA class 2 and ACA/AHA stage C 01/2018   Exacerbated by A. fib; EF 35 to 40% with global HK.  Reduced cardiac output and index (3.2/1.9).   -Associated moderate pulmonary hypertension-(mean PA P 38 mmHg)   GERD (gastroesophageal reflux disease)     occ. in past   Headache(784.0)    HTN (hypertension), benign    pt. states no hypertension   NICM (nonischemic cardiomyopathy) ([place]) 01/2018   Echo with EF of 35 to 40%.  Global HK.  Mild to moderate MR.  Mild nonobstructive CAD by cath.  Cardiac output/index 3.2/1.9   Pneumonia    ? as child   PONV (postoperative nausea and vomiting)    Seizures (HCC)    positive for HBP    PAST SURGICAL HISTORY: Past Surgical History:  Procedure Laterality Date   CARDIOVERSION N/A 02/24/2018   Procedure: CARDIOVERSION;  Surgeon: Sueanne Margarita, MD;  Location: [facility];  Service: Cardiovascular;  Laterality: N/A;   CATARACT EXTRACTION, BILATERAL Bilateral    CHOLECYSTECTOMY     COLONOSCOPY WITH PROPOFOL N/A 11/30/2012   Procedure: COLONOSCOPY WITH PROPOFOL;  Surgeon: Garlan Fair, MD;  Location: [facility];  Service: Endoscopy;  Laterality: N/A;   ESOPHAGOGASTRODUODENOSCOPY (EGD) WITH PROPOFOL N/A 11/30/2012   Procedure: ESOPHAGOGASTRODUODENOSCOPY (EGD) WITH PROPOFOL;  Surgeon: Garlan Fair, MD;  Location: [facility];  Service: Endoscopy;  Laterality: N/A;   ESOPHAGOGASTRODUODENOSCOPY (EGD) WITH PROPOFOL N/A 03/14/2015   Procedure: ESOPHAGOGASTRODUODENOSCOPY (EGD) WITH PROPOFOL;  Surgeon: Arta Silence, MD;  Location: [facility];  Service: Endoscopy;  Laterality: N/A;   EUS N/A 01/05/2013   Procedure: FULL UPPER ENDOSCOPIC ULTRASOUND (EUS) RADIAL;  Surgeon: Arta Silence, MD;  Location: [facility];  Service: Endoscopy;  Laterality: N/A;  EUS with possible FNA   EUS N/A 03/14/2015   Procedure: UPPER ENDOSCOPIC ULTRASOUND (EUS) RADIAL;  Surgeon: Arta Silence, MD;  Location: [facility];  Service: Endoscopy;  Laterality: N/A;   EYE SURGERY Left    "few weeks ago scar tissue laser surgery"   FINE NEEDLE ASPIRATION N/A 01/05/2013   Procedure: FINE NEEDLE ASPIRATION (FNA) RADIAL;  Surgeon: Arta Silence,  MD;  Location: [facility];  Service: Endoscopy;  Laterality: N/A;   KNEE  ARTHROSCOPY Left    MYELOMININGOCELE REPAIR  12/1990   RIGHT/LEFT HEART CATH AND CORONARY ANGIOGRAPHY N/A 02/22/2018   Procedure: RIGHT/LEFT HEART CATH AND CORONARY ANGIOGRAPHY;  Surgeon: Larey Dresser, MD;  Location: [facility];;  Nonobstructive CAD.  Mild to moderate pulmonary hypertension (PA P 47/24 mmHg-mean 38 mmHg.  PCWP 22 mmHg with LVEDP 24 mmHg. CO/CI = 3.2/1.9   TEE WITHOUT CARDIOVERSION N/A 02/24/2018   Procedure: TRANSESOPHAGEAL ECHOCARDIOGRAM (TEE) - WITH DCCV;  Surgeon: Sueanne Margarita, MD;  Location: [facility];; EF 35-40%.  Global HK.  No R WMA. Mild AI, Mild-Mod MR. Severe LA dilation (no LAA thrombus).  Severely reduced RV function.  Mild RA dilation --successful DCCV   TRANSTHORACIC ECHOCARDIOGRAM  02/18/2018   EF 35 to 40%.  Unable to assess diastolic function because of A. fib.  No obvious regional wall motion abnormality.  Global hypokinesis.  Severe LA dilation.  Mild-moderate MR.  Mild AI without AS   TRANSTHORACIC ECHOCARDIOGRAM  11/2019   CHF with A. fib RVR:  EF 45 to 50%.  Mildly reduced function.  GR 3 DD with elevated LAP.  Estimated RVP/PAP 72 to 73 mmHg.  Moderate MR and TR.  Mild to moderate PR.  Suggested CVP 15 mmHg.  Interestingly, despite the severe elevated pressures.  Both right and left atria were both normal size.   VAGINAL HYSTERECTOMY  1983    FAMILY HISTORY: Family History  Problem Relation Age of Onset   Dementia Father        lewy body dementia   Heart disease Other        PACEMAKER DEFIB.      SOCIAL HISTORY: Social History   Socioeconomic History   Marital status: Married    Spouse name: Jan   Number of children: 2   Years of education: Master's   Highest education level: Not on file  Occupational History   Occupation: retired    Comment: [employer] in [place]: RETIRED  Tobacco Use   Smoking status: Never   Smokeless tobacco: Never  Vaping Use   Vaping Use: Never used  Substance and Sexual Activity    Alcohol use: Not Currently    Comment: once monthly   Drug use: No   Sexual activity: Not on file  Other Topics Concern   Not on file  Social History Narrative   She retired in 1995 from [employer]. Lives at home with her husband, Jan. They have a son age 74 and a  son age 44. Cafffeine once daily. a week and alcohol once a month. Denies tobacco and illicit drug use.    Patient is left handed.   Patient has a Master's degree.   Social Determinants of Health   Financial Resource Strain: Not on file  Food Insecurity: Not on file  Transportation Needs: Not on file  Physical Activity: Not on file  Stress: Not on file  Social Connections: Not on file  Intimate Partner Violence: Not on file      PHYSICAL EXAM  Vitals:   03/03/22 1524  BP: 106/69  Pulse: (!) 50  Weight: 139 lb 8 oz (63.3 kg)  Height: 5' 1"$  (1.549 m)     Body mass index is 26.36 kg/m.  Generalized: Well developed, in no acute distress  Cardiology: normal rate and rhythm, no murmur noted Respiratory: clear to auscultation  bilaterally  Neurological examination  Mentation: Alert oriented to time, place, history taking. Follows all commands speech and language fluent Cranial nerve II-XII: Pupils were equal round reactive to light. Extraocular movements were full, visual field were full on confrontational test. Facial sensation and strength were normal. Head turning and shoulder shrug  were normal and symmetric. Motor: The motor testing reveals 5 over 5 strength of all 4 extremities. Good symmetric motor tone is noted throughout. No tremor noted  Sensory: Sensory testing is intact to soft touch on all 4 extremities. No evidence of extinction is noted.  Coordination: Cerebellar testing reveals good finger-nose-finger and heel-to-shin bilaterally.  Gait and station: Gait is normal.   DIAGNOSTIC DATA (LABS, IMAGING, TESTING) - I reviewed patient records, labs, notes, testing and imaging myself where  available.      No data to display           Lab Results  Component Value Date   WBC 5.0 01/09/2021   HGB 12.6 01/09/2021   HCT 39.4 01/09/2021   MCV 84.5 01/09/2021   PLT 226 01/09/2021      Component Value Date/Time   NA 136 01/10/2021 0209   NA 141 02/22/2020 0946   K 3.2 (L) 01/10/2021 1306   CL 97 (L) 01/10/2021 0209   CO2 29 01/10/2021 0209   GLUCOSE 95 01/10/2021 0209   BUN 16 01/10/2021 0209   BUN 20 02/22/2020 0946   CREATININE 1.04 (H) 01/10/2021 0209   CALCIUM 8.9 01/10/2021 0209   PROT 5.8 (L) 01/10/2021 0209   PROT 6.4 02/22/2020 0946   ALBUMIN 3.4 (L) 01/10/2021 0209   ALBUMIN 4.5 02/22/2020 0946   AST 92 (H) 01/10/2021 0209   ALT 198 (H) 01/10/2021 0209   ALKPHOS 51 01/10/2021 0209   BILITOT 0.8 01/10/2021 0209   BILITOT 0.5 02/22/2020 0946   GFRNONAA 54 (L) 01/10/2021 0209   GFRAA 63 02/22/2020 0946   Lab Results  Component Value Date   CHOL 194 02/22/2020   HDL 64 02/22/2020   LDLCALC 114 (H) 02/22/2020   TRIG 91 02/22/2020   CHOLHDL 3.0 02/22/2020   No results found for: "HGBA1C" No results found for: "VITAMINB12" Lab Results  Component Value Date   TSH 1.573 12/10/2019       ASSESSMENT AND PLAN 82 y.o. year old female  has a past medical history of Atrial fibrillation ([place]), Benign tumor of meninges ([place]), Chronic combined systolic and diastolic CHF, NYHA class 2 and ACA/AHA stage C (01/2018), GERD (gastroesophageal reflux disease), Headache(784.0), HTN (hypertension), benign, NICM (nonischemic cardiomyopathy) ([place]) (01/2018), Pneumonia, PONV (postoperative nausea and vomiting), and Seizures ([place]). here with     ICD-10-CM   1. Seizures ([place])  R56.9 MR BRAIN W WO CONTRAST    2. Meningioma ([place])  D32.9 MR BRAIN W WO CONTRAST    3. OSA (obstructive sleep apnea)  G47.33     4. Blurred vision  H53.8 CBC with Differential/Platelets    CMP    5. Essential hypertension  I10     6. Dizziness  R42 Thyroid Panel With TSH       Maureen  [name] reports that she has had intermittent blurred vision, shakiness and one episode of confusion since last visit. No obvious seizure events. She is tolerating levetiracetam 500 mg twice daily. 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 We will continue current therapy for now. Seizure precautions reviewed.  Adequate hydration discussed.  We reviewed MRI imaging in 2020 showing enlargement of two meningiomas with new frontal tumor noted. She has not wanted to update MRI imaging since.CT 12/2020 showed meningiomas were stable. We will order updated MRI for surveillance. I will check labs. We have discussed events that sound hypoglycemic in nature. She does not feel these events are seizure related. I have offered to repeat EEG but she declines at this time. Not typical of previous seizures. We have reviewed her sleep study results. She is adamant that she does not wish to use CPAP therapy. I have provided her with information for her review via AVS. I have also mentioned talking to her dentist regarding an oral appliance, however, she is aware that CPAP is gold standard in sleep apnea management. She is aware that she may call should she change her mind. I have advised she discuss anxiety with PCP. She will continue close follow-up with primary care for management of comorbidities. Continue to monitor BP and close follow up with cardiology. She will return to see me as scheduled, sooner if needed, pending workup.    Orders Placed This Encounter  Procedures   MR BRAIN W WO CONTRAST    Standing Status:   Future    Standing Expiration Date:   03/04/2023    Order Specific Question:   If indicated for the ordered procedure, I authorize the administration of contrast media per Radiology protocol    Answer:   Yes    Order Specific Question:   What is the patient's sedation requirement?    Answer:   No Sedation    Order Specific  Question:   Does the patient have a pacemaker or implanted devices?    Answer:   No    Order Specific Question:   Preferred imaging location?    Answer:   External   CBC with Differential/Platelets   CMP   Thyroid Panel With TSH      No orders of the defined types were placed in this encounter.     I spent 30 minutes of face-to-face and non-face-to-face time with patient.  This included previsit chart review, lab review, study review, order entry, electronic health record documentation, patient education.   Debbora Presto, FNP-C 03/03/2022, 4:09 PM [facility] [address], [place], [place] [address] [phone]

## 2022-03-03 NOTE — Telephone Encounter (Signed)
Called pt back. Thought glasses were causing blurry vision. Glasses checked out and ok. Blurry vision started in the last 3 months. She brought up with her PCP but sx have persisted. She is cutting back on some meds for her heart. She called cardiologist who feels this is unrelated to sx she is having.  170/77 was her BP this am. Pt is not currently taking BP medication. Would rather adjust diet than take medication.  She went to pick up husband last Thursday and could not figure out how to open the door. 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 Under a lot of stress.  Confirmed she is still taking levetiracetam 500 mg twice daily. She wants to discuss this med and option of coming off of this. Scheduled work in appt for today at 3:30pm with Amy Lomax,NP. Asked she check in at 3:00pm.

## 2022-03-04 LAB — CBC WITH DIFFERENTIAL/PLATELET
Basophils Absolute: 0.1 10*3/uL (ref 0.0–0.2)
Basos: 1 %
EOS (ABSOLUTE): 0.1 10*3/uL (ref 0.0–0.4)
Eos: 1 %
Hematocrit: 35.7 % (ref 34.0–46.6)
Hemoglobin: 11.8 g/dL (ref 11.1–15.9)
Immature Grans (Abs): 0 10*3/uL (ref 0.0–0.1)
Immature Granulocytes: 0 %
Lymphocytes Absolute: 1.1 10*3/uL (ref 0.7–3.1)
Lymphs: 15 %
MCH: 26.7 pg (ref 26.6–33.0)
MCHC: 33.1 g/dL (ref 31.5–35.7)
MCV: 81 fL (ref 79–97)
Monocytes Absolute: 0.8 10*3/uL (ref 0.1–0.9)
Monocytes: 11 %
Neutrophils Absolute: 5.4 10*3/uL (ref 1.4–7.0)
Neutrophils: 72 %
Platelets: 296 10*3/uL (ref 150–450)
RBC: 4.42 x10E6/uL (ref 3.77–5.28)
RDW: 16.4 % — ABNORMAL HIGH (ref 11.7–15.4)
WBC: 7.4 10*3/uL (ref 3.4–10.8)

## 2022-03-04 LAB — COMPREHENSIVE METABOLIC PANEL
ALT: 52 IU/L — ABNORMAL HIGH (ref 0–32)
AST: 47 IU/L — ABNORMAL HIGH (ref 0–40)
Albumin/Globulin Ratio: 2.2 (ref 1.2–2.2)
Albumin: 4.3 g/dL (ref 3.7–4.7)
Alkaline Phosphatase: 75 IU/L (ref 44–121)
BUN/Creatinine Ratio: 30 — ABNORMAL HIGH (ref 12–28)
BUN: 28 mg/dL — ABNORMAL HIGH (ref 8–27)
Bilirubin Total: 0.6 mg/dL (ref 0.0–1.2)
CO2: 22 mmol/L (ref 20–29)
Calcium: 9.6 mg/dL (ref 8.7–10.3)
Chloride: 102 mmol/L (ref 96–106)
Creatinine, Ser: 0.93 mg/dL (ref 0.57–1.00)
Globulin, Total: 2 g/dL (ref 1.5–4.5)
Glucose: 87 mg/dL (ref 70–99)
Potassium: 4.9 mmol/L (ref 3.5–5.2)
Sodium: 139 mmol/L (ref 134–144)
Total Protein: 6.3 g/dL (ref 6.0–8.5)
eGFR: 61 mL/min/{1.73_m2} (ref >59)

## 2022-03-04 LAB — THYROID PANEL WITH TSH
Free Thyroxine Index: 3.1 (ref 1.2–4.9)
T3 Uptake Ratio: 33 % (ref 24–39)
T4, Total: 9.5 ug/dL (ref 4.5–12.0)
TSH: 1.38 u[IU]/mL (ref 0.450–4.500)

## 2022-03-11 ENCOUNTER — Telehealth: Payer: Self-pay | Admitting: Family Medicine

## 2022-03-11 NOTE — Telephone Encounter (Signed)
Maureen [name]: [id number] exp. 03/11/22-04/10/22 sent to GI [id number]

## 2022-03-14 ENCOUNTER — Ambulatory Visit
Admission: RE | Admit: 2022-03-14 | Discharge: 2022-03-14 | Disposition: A | Discharge status: Home / Self Care | Payer: Medicare PPO | Source: Ambulatory Visit | Attending: Family Medicine | Admitting: Family Medicine

## 2022-03-14 DIAGNOSIS — D329 Benign neoplasm of meninges, unspecified: Secondary | ICD-10-CM | POA: Diagnosis not present

## 2022-03-14 DIAGNOSIS — R569 Unspecified convulsions: Secondary | ICD-10-CM

## 2022-03-14 MED ORDER — GADOPICLENOL 0.5 MMOL/ML IV SOLN
6.0000 mL | Freq: Once | INTRAVENOUS | Status: AC | PRN
  Administered 2022-03-14: 6 mL via INTRAVENOUS

## 2022-04-10 IMAGING — DX DG WRIST COMPLETE 3+V*L*
1 series · 4 of 4 positions shown · non-contrast
Comparison: None.

CLINICAL DATA: Dorsal injury

EXAM:
LEFT WRIST - COMPLETE 3+ VIEW

[Series 1: wrist · 0.14mm/px · 4 of 4 slices shown]
[im 1/4]
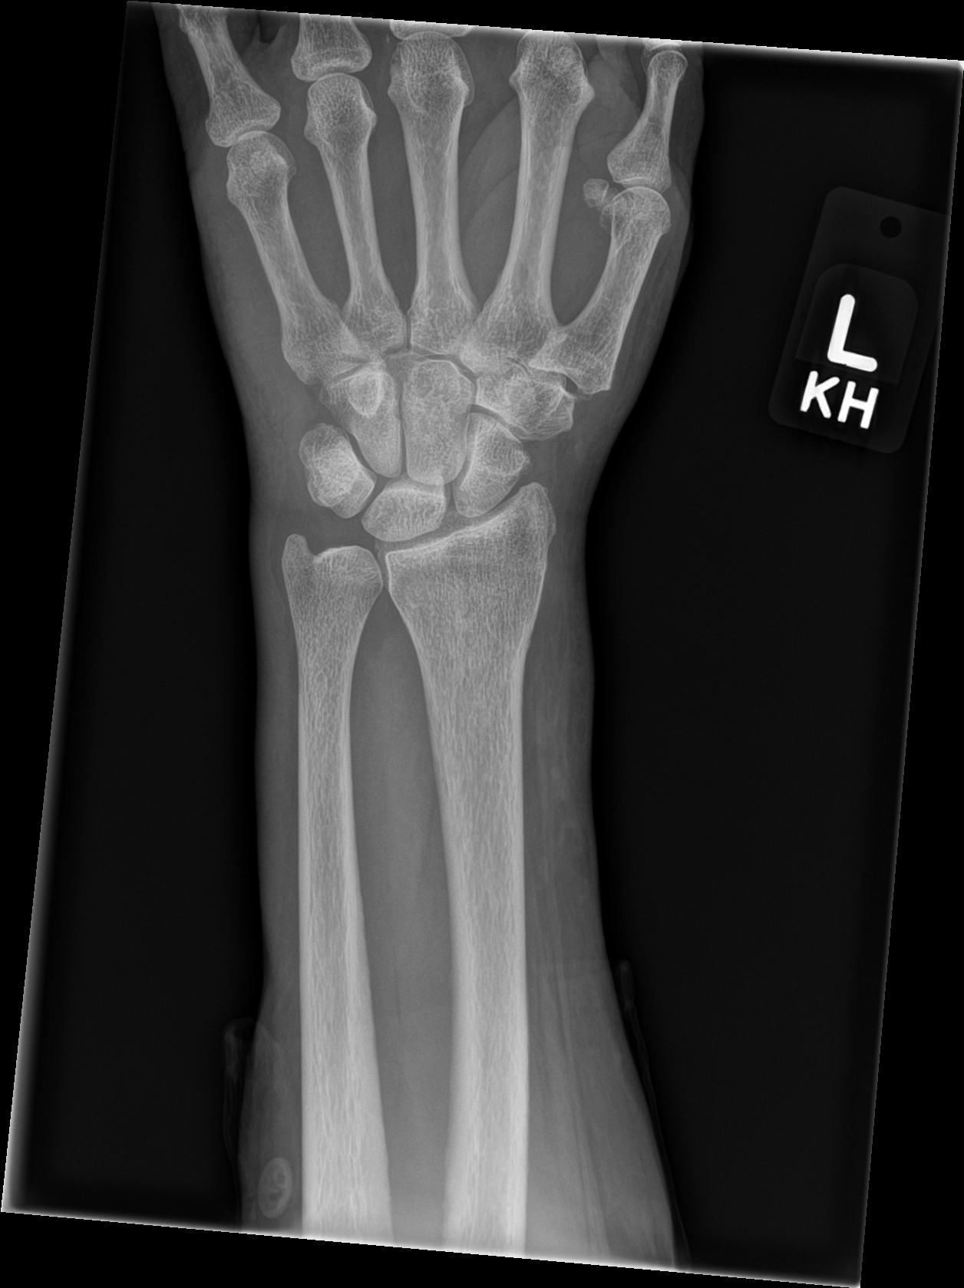
[im 2/4]
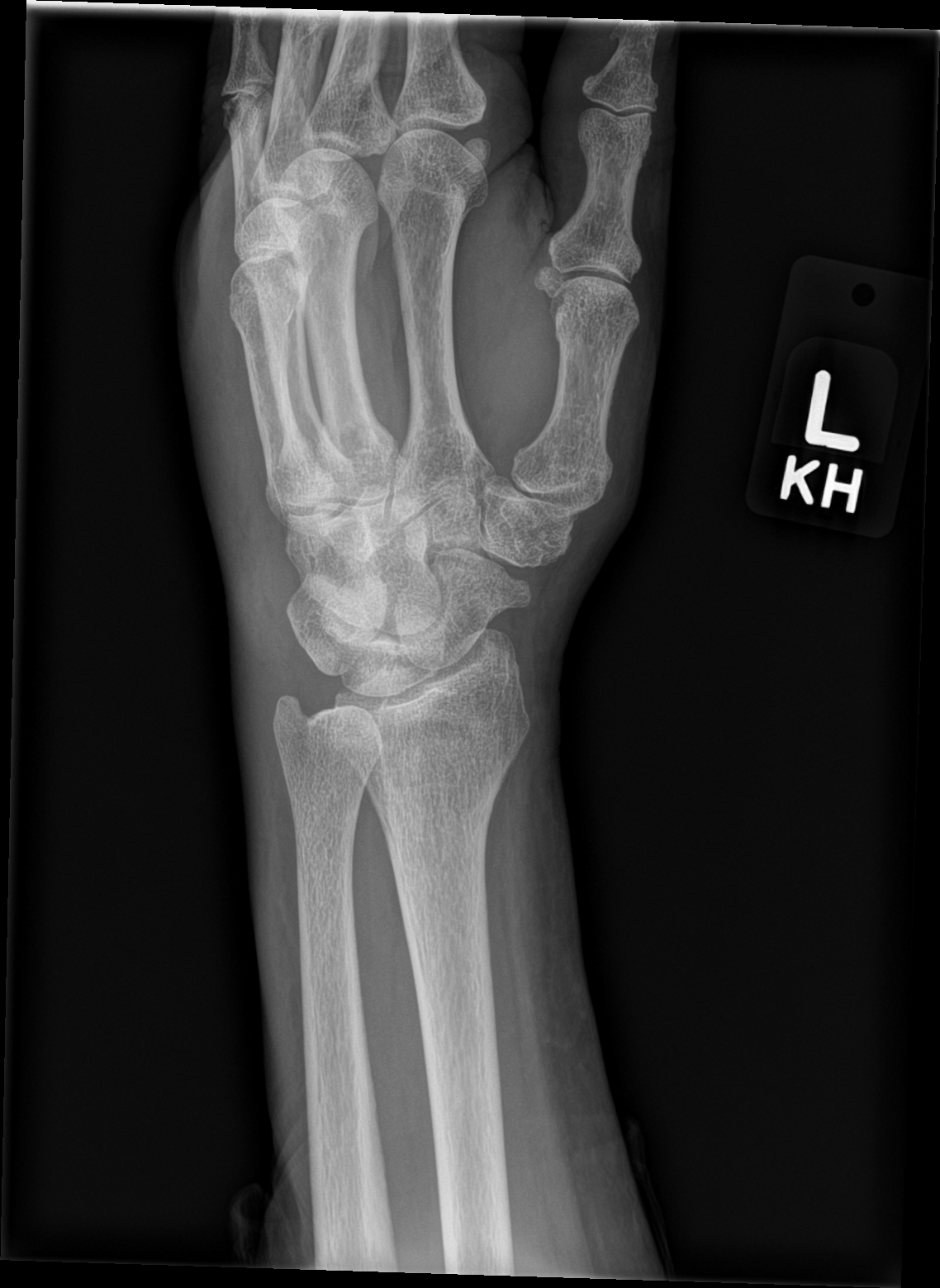
[im 3/4]
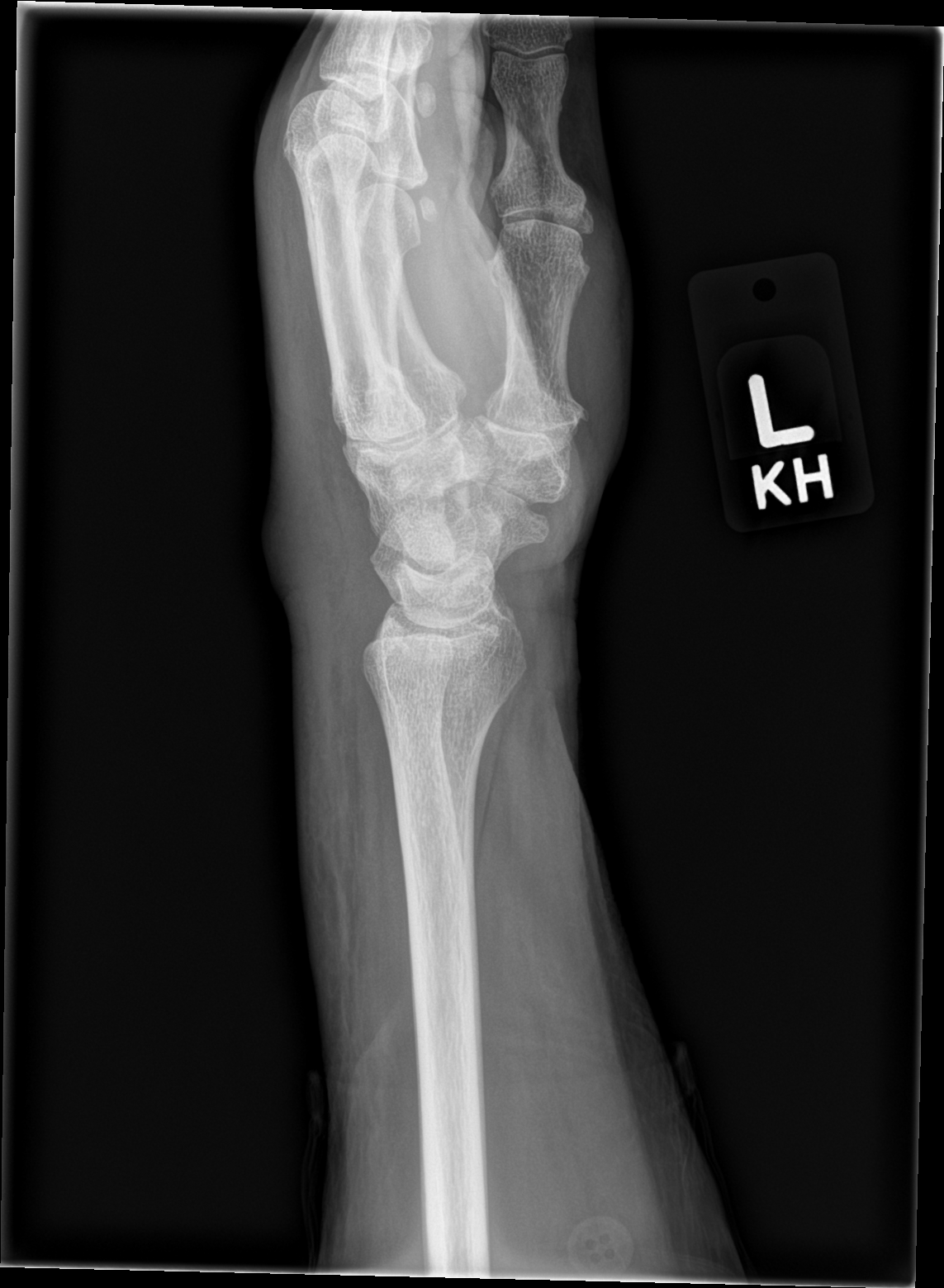
[im 4/4]
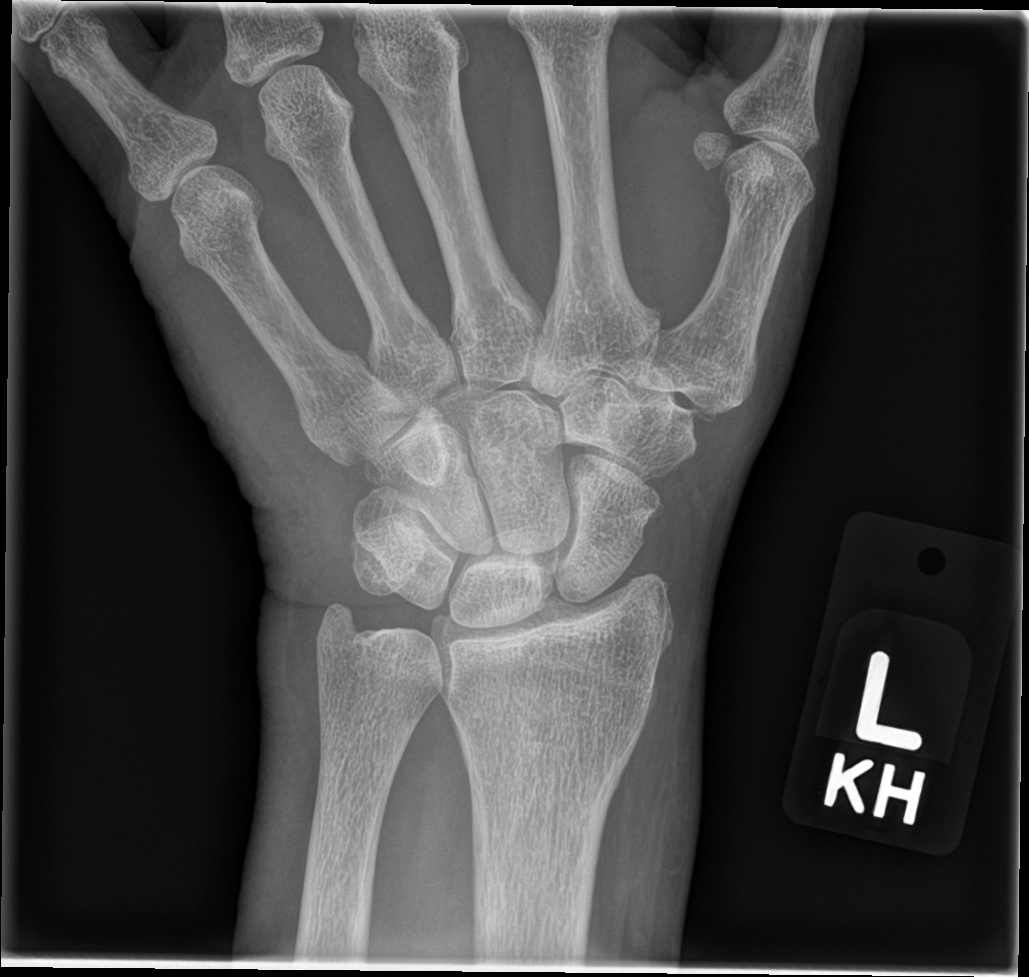

[4 of 4 positions shown; findings below may reference images not displayed]

FINDINGS: There is no evidence of fracture or dislocation. There is no
evidence of arthropathy or other focal bone abnormality. Soft tissue
edema about the dorsum of the wrist.
IMPRESSION: 1. No fracture or dislocation of the left wrist. The carpus is
normally aligned.

2.  Soft tissue edema about the dorsum of the wrist.

## 2022-04-23 DIAGNOSIS — I4891 Unspecified atrial fibrillation: Secondary | ICD-10-CM | POA: Diagnosis not present

## 2022-04-23 DIAGNOSIS — E7800 Pure hypercholesterolemia, unspecified: Secondary | ICD-10-CM | POA: Diagnosis not present

## 2022-04-23 DIAGNOSIS — R7303 Prediabetes: Secondary | ICD-10-CM | POA: Diagnosis not present

## 2022-04-23 DIAGNOSIS — I10 Essential (primary) hypertension: Secondary | ICD-10-CM | POA: Diagnosis not present

## 2022-04-23 DIAGNOSIS — I509 Heart failure, unspecified: Secondary | ICD-10-CM | POA: Diagnosis not present

## 2022-04-23 DIAGNOSIS — D6869 Other thrombophilia: Secondary | ICD-10-CM | POA: Diagnosis not present

## 2022-04-23 DIAGNOSIS — I420 Dilated cardiomyopathy: Secondary | ICD-10-CM | POA: Diagnosis not present

## 2022-04-23 DIAGNOSIS — G40909 Epilepsy, unspecified, not intractable, without status epilepticus: Secondary | ICD-10-CM | POA: Diagnosis not present

## 2022-04-23 DIAGNOSIS — I110 Hypertensive heart disease with heart failure: Secondary | ICD-10-CM | POA: Diagnosis not present

## 2022-04-23 DIAGNOSIS — I700 Atherosclerosis of aorta: Secondary | ICD-10-CM | POA: Diagnosis not present

## 2022-04-23 DIAGNOSIS — D329 Benign neoplasm of meninges, unspecified: Secondary | ICD-10-CM | POA: Diagnosis not present

## 2022-04-30 ENCOUNTER — Other Ambulatory Visit: Payer: Self-pay | Admitting: Family Medicine

## 2022-04-30 DIAGNOSIS — R945 Abnormal results of liver function studies: Secondary | ICD-10-CM

## 2022-05-08 ENCOUNTER — Other Ambulatory Visit: Payer: Self-pay | Admitting: Cardiology

## 2022-05-08 NOTE — Telephone Encounter (Signed)
Prescription refill request for Eliquis received. Indication: afib  Last office visit: Maureen [name] 02/17/2022 Scr: 0.93, 01/01/2023 Age: 82 yo  Weight: 63.3 kg   Refill sent.

## 2022-05-29 ENCOUNTER — Other Ambulatory Visit: Payer: Medicare PPO

## 2022-06-03 ENCOUNTER — Ambulatory Visit
Admission: RE | Admit: 2022-06-03 | Discharge: 2022-06-03 | Disposition: A | Discharge status: Home / Self Care | Payer: Medicare PPO | Source: Ambulatory Visit | Attending: Family Medicine | Admitting: Family Medicine

## 2022-06-03 DIAGNOSIS — R160 Hepatomegaly, not elsewhere classified: Secondary | ICD-10-CM | POA: Diagnosis not present

## 2022-06-03 DIAGNOSIS — R945 Abnormal results of liver function studies: Secondary | ICD-10-CM

## 2022-06-03 DIAGNOSIS — R7989 Other specified abnormal findings of blood chemistry: Secondary | ICD-10-CM | POA: Diagnosis not present

## 2022-06-17 DIAGNOSIS — H18519 Endothelial corneal dystrophy, unspecified eye: Secondary | ICD-10-CM | POA: Diagnosis not present

## 2022-06-17 DIAGNOSIS — H43393 Other vitreous opacities, bilateral: Secondary | ICD-10-CM | POA: Diagnosis not present

## 2022-06-19 DIAGNOSIS — R945 Abnormal results of liver function studies: Secondary | ICD-10-CM | POA: Diagnosis not present

## 2022-07-31 DIAGNOSIS — R945 Abnormal results of liver function studies: Secondary | ICD-10-CM | POA: Diagnosis not present

## 2022-08-05 ENCOUNTER — Other Ambulatory Visit: Payer: Self-pay | Admitting: *Deleted

## 2022-08-05 MED ORDER — LEVETIRACETAM 500 MG PO TABS: 500.0000 mg | ORAL_TABLET | Freq: Two times a day (BID) | ORAL | 0 refills | Status: DC

## 2022-08-05 NOTE — Telephone Encounter (Signed)
Last seen on 03/03/22 per note "We will continue levetiracetam 500mg  twice daily.  Follow up scheduled on 09/17/22 Last filled on 07/15/22 #60 tablets (30 day supply)

## 2022-09-02 DIAGNOSIS — R945 Abnormal results of liver function studies: Secondary | ICD-10-CM | POA: Diagnosis not present

## 2022-09-09 ENCOUNTER — Ambulatory Visit: Payer: Medicare PPO | Admitting: Family Medicine

## 2022-09-16 NOTE — Progress Notes (Deleted)
PATIENT: Maureen [name] DOB: July 17, 1940  REASON FOR VISIT: follow up HISTORY FROM: patient  No chief complaint on file.   HISTORY OF PRESENT ILLNESS:  09/16/22 ALL:  Maureen [name] returns for follow up for seizures. She was last seen 02/2022 and reported episodes of confusion, dizziness, blurred vision and intermittent shakiness. MRI showed slight increase in perisylvian meningioma but right parafalcine, right frontal and left frontal meningiomas were stable. EEG declined. She did not wish to have sleep eval. Since, she has continued levetiracetam 500mg  BID.   03/03/2022 ALL: Maureen [name] returns for an acute visit to discuss discontinuation of levetiracetam and an episode of confusion and intermittent shakiness and blurred vision.   She reports blurred vision has been present for the past 3 months. She was seen by ophthalmology and reports normal eye exam. She has been adjusting blood pressure medications due to low BP and pulse. She also reports a low blood sugar, today. She did not check CBG but felt this to be most likely as symptoms improved after eating. She has had similar events in the past. She is prediabetic.   She also reports one episode of confusion about a week ago. She was trying to take her husband to an appt and felt that she couldn't remember how to open the car door. She feels symptoms were present for about 5 minutes. She felt dizzy and shaky at that time. She denies obvious seizure activity. No post ictal state.   She reports pulse has been as low as 40 over the past 1-2 weeks. Normal for her is 60-70. Her blood pressure has been around 100-110/70-80. This morning she reported BP reading of 170/77. In office it is 106/69.     She last had MRI imaging in 2020 showing enlargement of two meningiomas with new frontal tumor noted. She has not wanted to update MRI imaging since. CT 12/2020 showed meningiomas were stable. She has continued levetiracetam since 2020 (previously on  carbamazepine). Last known seizure was in 2002. She is tolerating med well. She does have some brain fog.   She was diagnosed with severe OSA in 2020. She declines treatment. She does endorse significant stress. She is caring for her husband who is ill with dementia. She is trying to get him in an adult daycare a couple times a week. She is currently driving. No obvious difficulty. She is drinking about 24 ounces of water daily.   09/16/2021 ALL: Maureen [name] returns for follow up for seizures, hx meningioma and OSA. She continues levetiracetam 500mg  BID. No recent seizure activity. She is tolerating levetiracetam without obvious adverse effects.   She reports episodes of feeling weak, she feels like she has brain fog and has shaking all over, more so in hands. She notes this may happen 1-2 times a month and some months it does not happen. She reports with some events, she feels clammy. She feels events last anywhere from a couple minutes to about 15 minutes. She reports that eating a snack makes her feel better. She will usually drink orange juice. No LOC or loss of bowel/bladder control. She is prediabetic. She has follow up scheduled with PCP.   She does have left sided tremor noted with some action over the past few years. It may have worsened slightly. She does feel that she has a harder time witting or painting. No resting tremor.   CT 12/2020 showed multiple meningiomas which were unchanged from MRI 03/2018. She is not interested in updating MRI.  09/19/2020 ALL: Maureen [name] returns for follow up for seizures, history of meningioma and OSA. She continues levetiracetam 500mg  BID. No recent seizure activity.  She is tolerating medication well with no obvious adverse effects.   She continues to decline OSA treatment.   MRI was ordered last year for monitoring of meningiomas.  Last MRI in March 2020 did show enlargement of previous right and left frontal meningiomas and new right frontal lesion. She did not have  imaging completed. She reports going to the wrong imaging center and can not remember if she ever rescheduled imaging.   06/28/2019 ALL: Maureen [name] is a 82 y.o. female here today for follow up for history of epilepsy. She continues levetiracetam 500 BID. She denies any seizure activity since last being seen. She does mention an episode three months ago where she had "tightening" of the muscles of right lower extremity for about 30 minutes. She was lying in bed and noted symptoms. She was completely alert. She reports her husband was asleep so she did not want to make noises but could have talked if she needed to. She tried rolling over th her left side but symptoms continued. She states spasms spontaneously resolved. She does report missing several doses (unsure exactly how long she had been out of medication) just prior to event. She also reports not taking levetiracetam for the past week due to not having refills. No other seizure concerns. She is tolerating medicaton well. She does have persistent aching pain of both hands. She feels it is related to arthritis. She uses a topical cream which helps.   She was diagnosed with sleep apnea last year. AHI 40/hr. She does not wish to use CPAP therapy. She reports reading about sleep apnea and does not wish to start therapy. She is concerned that her husband "has gotten dependent" on his CPAP and she is adamant that she will not use it.   HISTORY: (copied from Dr Dohmeier's note on 03/03/2018)  Maureen [name] is an 82 year old female with a history of seizures and meningiomata. This patient is a left-handed Caucasian married female with a history of a left craniotomy for a left parasagittal meningioma resection on 01/04/1991.  Recent admission to hospital ( 02-22-2018)  for new onset atrial fibrillation, echo and cardioversion.  Medications were changed due to need for anticoagulation, and the patient placed on Keppra, Leviracetam.  She was weaned off  Carbamazepine- completely .  She was only during her hs hospital stay placed on amiodarone,  this needed not  to be continued after succesfull cardioversion. She remains on TOPROL, feels tired.   She is here for her routine yearly seizure follow up , but needs a sleep study for atrial fib risk work up. Atrial hypertrophy.   She remains hoarse since intubation.    Interval history from 11/10/2016 I have the pleasure of seeing Maureen [name]. Maureen [name] today, meanwhile 82 years old, And followed in our practice for a remote history of seizures.Patient is tolerating Tegretol XL 4 years very well, she operates a motor vehicle without difficulty had no accidents. She denies any recent seizure events. The last possible seizure was 2002, years after her craniotomy. She reports concern of recurrence of meningeomata. She had her last MRI 20 month ago. Reviewed today the MRI study from January 2017, which documented multiple small meningiomata. These measure in the millimeter range. They could however have grown in the meanwhile. I will repeat this brain MRI study in November or December of this  year   08-03-2015 MM. Maureen [name] is a 82 year old female with a history of seizures and meningiomata. She returns today for follow-up. She states that she is doing well. She continues to take Tegretol XL and tolerating it well. Denies any seizure events. She operates a Librarian, academic without difficulty. Denies any changes with her gait or balance. Able to complete all ADLs independently. She returns today for an evaluation.   HISTORY 07/11/14 (MM): Maureen [name] is a 82 year old female with a history of seizures and meningioma. She returns today for follow-up. Patient is currently taken Tegretol XL and tolerating it well. She reports that she is not had a seizure since the last visit. She is able to complete all ADLs independently. She operates a Librarian, academic without difficulty. Since the last visit she does notice a mild tremor in  the left hand is intermittent. She denies any changes with her gait or balance. She continues to take calcium and vitamin D supplementation for osteopenia She returns today for an evaluation.   HISTORY 05/26/13 (CD):   Maureen [name] is a 82 y.o. female seen at [facility] here as a transfer of care from [facility]. She is a retired Programmer, systems , with a Event organiser , taught elementary school.  Patient has been followed him as her primary care provider by Dr. Donia Guiles, now Dr Cam Hai.  This patient is a left-handed Caucasian married female with a history of a left craniotomy for a left parasagittal meningioma resection on 01/04/1991.  She has been followed by Dr. Avie Echevaria since July 1992 . Her last seizure is now over 10 years ago.   Preoperatively she was treated with Decadron and Phenobarbital after she developed a seizure , focal to the right body, this let to an evaluation and the discovery of the meningeoma. After the surgery she was started on Dilantin but developed a rash and became ataxic to this medication was discontinued. She continued for while to have abnormal sensations in her right leg ans sometimes right arm , which in most respects were identified a simple partial seizures. Inspired Dr. Imagene Gurney  last EEG was normal. She continued to the right foot and leg motor seizures that she describes as a spasm of the right foot.  Dr. Sandria Manly placed onTegretol since she had marked improvement. At the time of his last visit on 08/06/2009 the patient took brand nameTegretol-XR 200 mg twice a day she had a slight decrease in serum Na levels and 2008 underwent DEXA scan,  which was normal. She still reported about once a year  A symptom of leg spasm, no seizures per se. Possible  symptom of spasm beginning in the right  Foot and ankle up going to the right knee and then radiating downwards to the foot with a duration of less than a minute. A repeated DEXA scan revealed ion13 April 2014 newly diagnosed severe  osteopenia. She begun taking vitamin D and calcium, recently in the last 6 month.     REVIEW OF SYSTEMS: Out of a complete 14 system review of symptoms, the patient complains only of the following symptoms, seizure, pain of both hands and all other reviewed systems are negative.  ALLERGIES: Allergies  Allergen Reactions   Dilantin [Phenytoin Sodium Extended]      Stupor, ataxia.    Hydrocodone Nausea And Vomiting    HOME MEDICATIONS: Outpatient Medications Prior to Visit  Medication Sig Dispense Refill   amiodarone (PACERONE) 200 MG tablet Take 1 tablet (200  mg total) by mouth daily. 30 tablet 6   apixaban (ELIQUIS) 5 MG TABS tablet TAKE ONE TABLET BY MOUTH TWICE DAILY 180 tablet 1   Calcium-Magnesium-Zinc (CAL-MAG-ZINC PO) Take 1 tablet by mouth daily at 12 noon. (Patient not taking: Reported on 03/03/2022)     Cholecalciferol (VITAMIN D-3) 25 MCG (1000 UT) CAPS Take 1,000 Units by mouth daily. Take 1 capsule daily (Patient not taking: Reported on 03/03/2022)     furosemide (LASIX) 40 MG tablet 1 tablet Orally once a day if needed     levETIRAcetam (KEPPRA) 500 MG tablet Take 1 tablet (500 mg total) by mouth 2 (two) times daily. 60 tablet 0   losartan (COZAAR) 50 MG tablet Take 1 tablet (50 mg total) by mouth daily. SCHEDULE OFFICE VISIT FOR FUTURE REFILLS. 30 tablet 0   metoprolol succinate (TOPROL-XL) 25 MG 24 hr tablet Take 1 tablet (25 mg total) by mouth daily. Stop taking after 2 weeks starting 0n 02/18/22) 90 tablet 1   Multiple Vitamin (MULTIVITAMIN WITH MINERALS) TABS tablet Take 1 tablet by mouth daily.     pravastatin (PRAVACHOL) 40 MG tablet Take 40 mg by mouth daily.      No facility-administered medications prior to visit.    PAST MEDICAL HISTORY: Past Medical History:  Diagnosis Date   Atrial fibrillation (HCC)    Benign tumor of meninges (HCC)    Chronic combined systolic and diastolic CHF, NYHA class 2 and ACA/AHA stage C 01/2018   Exacerbated by A. fib; EF 35 to  40% with global HK.  Reduced cardiac output and index (3.2/1.9).   -Associated moderate pulmonary hypertension-(mean PA P 38 mmHg)   GERD (gastroesophageal reflux disease)    occ. in past   Headache(784.0)    HTN (hypertension), benign    pt. states no hypertension   NICM (nonischemic cardiomyopathy) (HCC) 01/2018   Echo with EF of 35 to 40%.  Global HK.  Mild to moderate MR.  Mild nonobstructive CAD by cath.  Cardiac output/index 3.2/1.9   Pneumonia    ? as child   PONV (postoperative nausea and vomiting)    Seizures (HCC)    positive for HBP    PAST SURGICAL HISTORY: Past Surgical History:  Procedure Laterality Date   CARDIOVERSION N/A 02/24/2018   Procedure: CARDIOVERSION;  Surgeon: Quintella Reichert, MD;  Location: [facility];  Service: Cardiovascular;  Laterality: N/A;   CATARACT EXTRACTION, BILATERAL Bilateral    CHOLECYSTECTOMY     COLONOSCOPY WITH PROPOFOL N/A 11/30/2012   Procedure: COLONOSCOPY WITH PROPOFOL;  Surgeon: Charolett Bumpers, MD;  Location: [facility];  Service: Endoscopy;  Laterality: N/A;   ESOPHAGOGASTRODUODENOSCOPY (EGD) WITH PROPOFOL N/A 11/30/2012   Procedure: ESOPHAGOGASTRODUODENOSCOPY (EGD) WITH PROPOFOL;  Surgeon: Charolett Bumpers, MD;  Location: [facility];  Service: Endoscopy;  Laterality: N/A;   ESOPHAGOGASTRODUODENOSCOPY (EGD) WITH PROPOFOL N/A 03/14/2015   Procedure: ESOPHAGOGASTRODUODENOSCOPY (EGD) WITH PROPOFOL;  Surgeon: Willis Modena, MD;  Location: [facility];  Service: Endoscopy;  Laterality: N/A;   EUS N/A 01/05/2013   Procedure: FULL UPPER ENDOSCOPIC ULTRASOUND (EUS) RADIAL;  Surgeon: Willis Modena, MD;  Location: [facility];  Service: Endoscopy;  Laterality: N/A;  EUS with possible FNA   EUS N/A 03/14/2015   Procedure: UPPER ENDOSCOPIC ULTRASOUND (EUS) RADIAL;  Surgeon: Willis Modena, MD;  Location: [facility];  Service: Endoscopy;  Laterality: N/A;   EYE SURGERY Left    "few weeks ago scar tissue laser surgery"   FINE NEEDLE  ASPIRATION N/A 01/05/2013  Procedure: FINE NEEDLE ASPIRATION (FNA) RADIAL;  Surgeon: Willis Modena, MD;  Location: [facility];  Service: Endoscopy;  Laterality: N/A;   KNEE ARTHROSCOPY Left    MYELOMININGOCELE REPAIR  12/1990   RIGHT/LEFT HEART CATH AND CORONARY ANGIOGRAPHY N/A 02/22/2018   Procedure: RIGHT/LEFT HEART CATH AND CORONARY ANGIOGRAPHY;  Surgeon: Laurey Morale, MD;  Location: [facility];;  Nonobstructive CAD.  Mild to moderate pulmonary hypertension (PA P 47/24 mmHg-mean 38 mmHg.  PCWP 22 mmHg with LVEDP 24 mmHg. CO/CI = 3.2/1.9   TEE WITHOUT CARDIOVERSION N/A 02/24/2018   Procedure: TRANSESOPHAGEAL ECHOCARDIOGRAM (TEE) - WITH DCCV;  Surgeon: Quintella Reichert, MD;  Location: [facility];; EF 35-40%.  Global HK.  No R WMA. Mild AI, Mild-Mod MR. Severe LA dilation (no LAA thrombus).  Severely reduced RV function.  Mild RA dilation --successful DCCV   TRANSTHORACIC ECHOCARDIOGRAM  02/18/2018   EF 35 to 40%.  Unable to assess diastolic function because of A. fib.  No obvious regional wall motion abnormality.  Global hypokinesis.  Severe LA dilation.  Mild-moderate MR.  Mild AI without AS   TRANSTHORACIC ECHOCARDIOGRAM  11/2019   CHF with A. fib RVR:  EF 45 to 50%.  Mildly reduced function.  GR 3 DD with elevated LAP.  Estimated RVP/PAP 72 to 73 mmHg.  Moderate MR and TR.  Mild to moderate PR.  Suggested CVP 15 mmHg.  Interestingly, despite the severe elevated pressures.  Both right and left atria were both normal size.   VAGINAL HYSTERECTOMY  1983    FAMILY HISTORY: Family History  Problem Relation Age of Onset   Dementia Father        lewy body dementia   Heart disease Other        PACEMAKER DEFIB.      SOCIAL HISTORY: Social History   Socioeconomic History   Marital status: Married    Spouse name: Jan   Number of children: 2   Years of education: Master's   Highest education level: Not on file  Occupational History   Occupation: retired    Comment: [employer] in [employer]    Employer: RETIRED  Tobacco Use   Smoking status: Never   Smokeless tobacco: Never  Vaping Use   Vaping status: Never Used  Substance and Sexual Activity   Alcohol use: Not Currently    Comment: once monthly   Drug use: No   Sexual activity: Not on file  Other Topics Concern   Not on file  Social History Narrative   She retired in 1995 from [employer]. Lives at home with her husband, Jan. They have a son age 15 and a  son age 46. Cafffeine once daily. a week and alcohol once a month. Denies tobacco and illicit drug use.    Patient is left handed.   Patient has a Master's degree.   Social Determinants of Health   Financial Resource Strain: Not on file  Food Insecurity: Not on file  Transportation Needs: Not on file  Physical Activity: Not on file  Stress: Not on file  Social Connections: Not on file  Intimate Partner Violence: Not on file      PHYSICAL EXAM  There were no vitals filed for this visit.    There is no height or weight on file to calculate BMI.  Generalized: Well developed, in no acute distress  Cardiology: normal rate and rhythm, no murmur noted Respiratory: clear to auscultation bilaterally  Neurological examination  Mentation: Alert oriented to time, place, history taking. Follows all commands speech and language fluent Cranial nerve II-XII: Pupils were equal round reactive to light. Extraocular movements were full, visual field were full on confrontational test. Facial sensation and strength were normal. Head turning and shoulder shrug  were normal and symmetric. Motor: The motor testing reveals 5 over 5 strength of all 4 extremities. Good symmetric motor tone is noted throughout. No tremor noted  Sensory: Sensory testing is intact to soft touch on all 4 extremities. No evidence of extinction is noted.  Coordination: Cerebellar testing reveals good finger-nose-finger and heel-to-shin bilaterally.  Gait and station:  Gait is normal.   DIAGNOSTIC DATA (LABS, IMAGING, TESTING) - I reviewed patient records, labs, notes, testing and imaging myself where available.      No data to display           Lab Results  Component Value Date   WBC 7.4 03/03/2022   HGB 11.8 03/03/2022   HCT 35.7 03/03/2022   MCV 81 03/03/2022   PLT 296 03/03/2022      Component Value Date/Time   NA 139 03/03/2022 1607   K 4.9 03/03/2022 1607   CL 102 03/03/2022 1607   CO2 22 03/03/2022 1607   GLUCOSE 87 03/03/2022 1607   GLUCOSE 95 01/10/2021 0209   BUN 28 (H) 03/03/2022 1607   CREATININE 0.93 03/03/2022 1607   CALCIUM 9.6 03/03/2022 1607   PROT 6.3 03/03/2022 1607   ALBUMIN 4.3 03/03/2022 1607   AST 47 (H) 03/03/2022 1607   ALT 52 (H) 03/03/2022 1607   ALKPHOS 75 03/03/2022 1607   BILITOT 0.6 03/03/2022 1607   GFRNONAA 54 (L) 01/10/2021 0209   GFRAA 63 02/22/2020 0946   Lab Results  Component Value Date   CHOL 194 02/22/2020   HDL 64 02/22/2020   LDLCALC 114 (H) 02/22/2020   TRIG 91 02/22/2020   CHOLHDL 3.0 02/22/2020   No results found for: "HGBA1C" No results found for: "VITAMINB12" Lab Results  Component Value Date   TSH 1.380 03/03/2022       ASSESSMENT AND PLAN 82 y.o. year old female  has a past medical history of Atrial fibrillation (HCC), Benign tumor of meninges (HCC), Chronic combined systolic and diastolic CHF, NYHA class 2 and ACA/AHA stage C (01/2018), GERD (gastroesophageal reflux disease), Headache(784.0), HTN (hypertension), benign, NICM (nonischemic cardiomyopathy) (HCC) (01/2018), Pneumonia, PONV (postoperative nausea and vomiting), and Seizures (HCC). here with   No diagnosis found.    Mrs Valazquez reports that she has had intermittent blurred vision, shakiness and one episode of confusion since last visit. No obvious seizure events. She is tolerating levetiracetam 500 mg twice daily. We discussed discontinuation but she is hesitant about driving restrictions as she is primary  care giver for her husband. We will continue current therapy for now. Seizure precautions reviewed.  Adequate hydration discussed. We reviewed MRI imaging in 2020 showing enlargement of two meningiomas with new frontal tumor noted. She has not wanted to update MRI imaging since.CT 12/2020 showed meningiomas were stable. We will order updated MRI for surveillance. I will check labs. We have discussed events that sound hypoglycemic in nature. She does not feel these events are seizure related. I have offered to repeat EEG but she declines at this time. Not typical of previous seizures. We have reviewed her sleep study results. She is adamant that she does not wish to use CPAP therapy. I have provided her with information for her review via AVS. I  have also mentioned talking to her dentist regarding an oral appliance, however, she is aware that CPAP is gold standard in sleep apnea management. She is aware that she may call should she change her mind. I have advised she discuss anxiety with PCP. She will continue close follow-up with primary care for management of comorbidities. Continue to monitor BP and close follow up with cardiology. She will return to see me as scheduled, sooner if needed, pending workup.    No orders of the defined types were placed in this encounter.     No orders of the defined types were placed in this encounter.     I spent 30 minutes of face-to-face and non-face-to-face time with patient.  This included previsit chart review, lab review, study review, order entry, electronic health record documentation, patient education.   Shawnie Dapper, FNP-C 09/16/2022, 1:04 PM [facility] [address], [address] [place], [place] [address] [phone]

## 2022-09-17 ENCOUNTER — Other Ambulatory Visit: Payer: Self-pay

## 2022-09-17 ENCOUNTER — Ambulatory Visit: Payer: Medicare PPO | Admitting: Family Medicine

## 2022-09-17 ENCOUNTER — Telehealth: Payer: Self-pay | Admitting: Family Medicine

## 2022-09-17 DIAGNOSIS — R569 Unspecified convulsions: Secondary | ICD-10-CM

## 2022-09-17 DIAGNOSIS — G4733 Obstructive sleep apnea (adult) (pediatric): Secondary | ICD-10-CM

## 2022-09-17 DIAGNOSIS — D329 Benign neoplasm of meninges, unspecified: Secondary | ICD-10-CM

## 2022-09-17 MED ORDER — LEVETIRACETAM 500 MG PO TABS: 500.0000 mg | ORAL_TABLET | Freq: Two times a day (BID) | ORAL | 0 refills | Status: DC

## 2022-09-17 NOTE — Telephone Encounter (Signed)
LVM and sent mychart msg informing pt of need to reschedule 09/17/22 appt - NP out

## 2022-09-24 ENCOUNTER — Encounter: Payer: Self-pay | Admitting: Cardiology

## 2022-09-24 ENCOUNTER — Other Ambulatory Visit: Payer: Self-pay

## 2022-09-24 ENCOUNTER — Encounter: Payer: Self-pay | Admitting: Family Medicine

## 2022-09-29 ENCOUNTER — Other Ambulatory Visit: Payer: Self-pay

## 2022-09-29 MED ORDER — APIXABAN 5 MG PO TABS: 5.0000 mg | ORAL_TABLET | Freq: Two times a day (BID) | ORAL | 1 refills | Status: DC

## 2022-09-29 NOTE — Telephone Encounter (Signed)
Prescription refill request for Eliquis received. Indication:afib Last office visit:1/24 Scr:0.93  2/24 Age: 82 Weight:63.3  kg  Prescription refilled

## 2022-09-30 DIAGNOSIS — R300 Dysuria: Secondary | ICD-10-CM | POA: Diagnosis not present

## 2022-09-30 DIAGNOSIS — N3090 Cystitis, unspecified without hematuria: Secondary | ICD-10-CM | POA: Diagnosis not present

## 2022-10-02 ENCOUNTER — Other Ambulatory Visit: Payer: Self-pay

## 2022-10-02 MED ORDER — LEVETIRACETAM 500 MG PO TABS: 500.0000 mg | ORAL_TABLET | Freq: Two times a day (BID) | ORAL | 0 refills | Status: DC

## 2022-10-18 ENCOUNTER — Encounter (INDEPENDENT_AMBULATORY_CARE_PROVIDER_SITE_OTHER): Payer: Self-pay

## 2022-10-19 ENCOUNTER — Other Ambulatory Visit: Payer: Self-pay | Admitting: Family Medicine

## 2022-10-20 MED ORDER — LEVETIRACETAM 500 MG PO TABS: 500.0000 mg | ORAL_TABLET | Freq: Two times a day (BID) | ORAL | 0 refills | Status: DC

## 2022-10-20 NOTE — Telephone Encounter (Signed)
Last seen on 03/03/22 No follow up scheduled  Last filled on 10/06/22 #60 tablets (30 day supply) filled with [facility].

## 2022-10-23 ENCOUNTER — Other Ambulatory Visit: Payer: Self-pay | Admitting: Family Medicine

## 2022-10-23 DIAGNOSIS — R7303 Prediabetes: Secondary | ICD-10-CM | POA: Diagnosis not present

## 2022-10-23 DIAGNOSIS — G4700 Insomnia, unspecified: Secondary | ICD-10-CM | POA: Diagnosis not present

## 2022-10-23 DIAGNOSIS — R160 Hepatomegaly, not elsewhere classified: Secondary | ICD-10-CM

## 2022-10-23 DIAGNOSIS — I10 Essential (primary) hypertension: Secondary | ICD-10-CM | POA: Diagnosis not present

## 2022-10-23 DIAGNOSIS — E042 Nontoxic multinodular goiter: Secondary | ICD-10-CM | POA: Diagnosis not present

## 2022-10-23 DIAGNOSIS — E7800 Pure hypercholesterolemia, unspecified: Secondary | ICD-10-CM | POA: Diagnosis not present

## 2022-10-23 DIAGNOSIS — R748 Abnormal levels of other serum enzymes: Secondary | ICD-10-CM | POA: Diagnosis not present

## 2022-10-23 LAB — LAB REPORT - SCANNED
A1c: 5.7
EGFR: 70

## 2022-10-27 ENCOUNTER — Encounter: Payer: Self-pay | Admitting: Family Medicine

## 2022-11-10 ENCOUNTER — Other Ambulatory Visit: Payer: Self-pay

## 2022-11-10 MED ORDER — LEVETIRACETAM 500 MG PO TABS: 500.0000 mg | ORAL_TABLET | Freq: Two times a day (BID) | ORAL | 0 refills | Status: DC

## 2022-11-24 ENCOUNTER — Other Ambulatory Visit: Payer: Self-pay

## 2022-11-24 ENCOUNTER — Other Ambulatory Visit: Payer: Self-pay | Admitting: Family Medicine

## 2022-11-24 NOTE — Telephone Encounter (Signed)
Called [facility] at [phone]. Spoke w/ Joni Reining.  They spoke with pt 10/27/22. Shows they dispensed #60 11/07/22.  Next fill will go out 12/06/2022.   They reached out to her in error about December fill. Nothing further needed from our office or pt at this time.

## 2022-11-26 MED ORDER — LEVETIRACETAM 500 MG PO TABS: 500.0000 mg | ORAL_TABLET | Freq: Two times a day (BID) | ORAL | 1 refills | Status: DC

## 2022-11-27 ENCOUNTER — Other Ambulatory Visit: Payer: Medicare PPO

## 2022-11-28 ENCOUNTER — Ambulatory Visit
Admission: RE | Admit: 2022-11-28 | Discharge: 2022-11-28 | Disposition: A | Discharge status: Home / Self Care | Payer: Medicare PPO | Source: Ambulatory Visit | Attending: Family Medicine | Admitting: Family Medicine

## 2022-11-28 DIAGNOSIS — Z9049 Acquired absence of other specified parts of digestive tract: Secondary | ICD-10-CM | POA: Diagnosis not present

## 2022-11-28 DIAGNOSIS — R160 Hepatomegaly, not elsewhere classified: Secondary | ICD-10-CM

## 2022-11-28 DIAGNOSIS — K769 Liver disease, unspecified: Secondary | ICD-10-CM | POA: Diagnosis not present

## 2022-12-09 DIAGNOSIS — R413 Other amnesia: Secondary | ICD-10-CM | POA: Diagnosis not present

## 2022-12-09 DIAGNOSIS — I4891 Unspecified atrial fibrillation: Secondary | ICD-10-CM | POA: Diagnosis not present

## 2022-12-09 DIAGNOSIS — B829 Intestinal parasitism, unspecified: Secondary | ICD-10-CM | POA: Diagnosis not present

## 2022-12-09 DIAGNOSIS — I10 Essential (primary) hypertension: Secondary | ICD-10-CM | POA: Diagnosis not present

## 2022-12-09 DIAGNOSIS — Z5181 Encounter for therapeutic drug level monitoring: Secondary | ICD-10-CM | POA: Diagnosis not present

## 2022-12-09 DIAGNOSIS — Z79899 Other long term (current) drug therapy: Secondary | ICD-10-CM | POA: Diagnosis not present

## 2022-12-15 DIAGNOSIS — B829 Intestinal parasitism, unspecified: Secondary | ICD-10-CM | POA: Diagnosis not present

## 2023-01-06 ENCOUNTER — Other Ambulatory Visit: Payer: Self-pay | Admitting: Neurology

## 2023-01-06 ENCOUNTER — Ambulatory Visit: Payer: Medicare PPO | Admitting: Neurology

## 2023-01-06 ENCOUNTER — Encounter: Payer: Self-pay | Admitting: Neurology

## 2023-01-06 VITALS — BP 134/69 | HR 78 | Ht 62.0 in | Wt 130.9 lb

## 2023-01-06 DIAGNOSIS — R569 Unspecified convulsions: Secondary | ICD-10-CM | POA: Diagnosis not present

## 2023-01-06 DIAGNOSIS — G4733 Obstructive sleep apnea (adult) (pediatric): Secondary | ICD-10-CM | POA: Diagnosis not present

## 2023-01-06 DIAGNOSIS — D329 Benign neoplasm of meninges, unspecified: Secondary | ICD-10-CM | POA: Diagnosis not present

## 2023-01-06 MED ORDER — LEVETIRACETAM 500 MG PO TABS: 500.0000 mg | ORAL_TABLET | Freq: Two times a day (BID) | ORAL | 6 refills | Status: DC

## 2023-01-06 NOTE — Progress Notes (Signed)
**Note De-Identified Maureen Obfuscation** Provider:  Melvyn Novas, MD  Primary Care Physician:  Lupita Raider, MD [address] [place] [address]     Referring Provider: Lupita Raider, Md [address] [place],  [place] [address]          Chief Complaint according to patient   Patient presents with:     N           HISTORY OF PRESENT ILLNESS:  Maureen [name] is a 82 y.o. female patient who is here for revisit on  01/06/2023 for  " medication refills" .  Chief concern according to patient :  I just need to follow up for medication renewal. [facility] pharmacy for Keppra.  History of meningeomata and resection before 2000.  Has 4 meningeomata left.     STUDY DATE: 03/14/2022 PATIENT NAME: Maureen [name] DOB: December 05, 1940  EXAM: MRI Brain with and without contrast   ORDERING CLINICIAN: Aleene Davidson, NP CLINICAL HISTORY: 82 year old woman with meningiomas and seizures COMPARISON FILMS: 04/05/2018  There are postcraniotomy changes in the left anterior parietal convexity consistent with removal of previous meningioma.  There is adjacent encephalomalacia and gliosis.  There does not appear to be any recurrent tumor and this appears unchanged compared to the previous MRI.   Additionally there are 4 meningiomas: left perisylvian meningioma measures 12 x 11 x 13 mm (AP x transverse x CC).  Right parafalcine meningioma measures 6.9 x 7 (AP x transverse x CC).  Right frontal meningioma measures 9 x 9 x 7 mm (AP x transverse x CC).  Left frontal meningioma measures 5 x 5 x 4 mm (AP x transverse x CC).  The perisylvian meningioma has increased in size from 9 x 5 x 8 mm.  The other 3 are unchanged     Review of Systems: Out of a complete 14 system review, the patient complains of only the following symptoms, and all other reviewed systems are negative.:     Social History   Socioeconomic History   Marital status: Married    Spouse name: Jan   Number of children: 2   Years of  education: Master's   Highest education level: Not on file  Occupational History   Occupation: retired    Comment: [employer] in 1995    Employer: RETIRED  Tobacco Use   Smoking status: Never   Smokeless tobacco: Never  Vaping Use   Vaping status: Never Used  Substance and Sexual Activity   Alcohol use: Not Currently    Comment: once monthly   Drug use: No   Sexual activity: Not on file  Other Topics Concern   Not on file  Social History Narrative   She retired in 1995 from [employer]. Lives at home with her husband, Jan. They have a son age 34 and a  son age 77. Cafffeine once daily. a week and alcohol once a month. Denies tobacco and illicit drug use.    Patient is left handed.   Patient has a Master's degree.   Social Drivers of Corporate investment banker Strain: Not on file  Food Insecurity: Not on file  Transportation Needs: Not on file  Physical Activity: Not on file  Stress: Not on file  Social Connections: Not on file    Family History  Problem Relation Age of Onset   Dementia Father        lewy body dementia  Heart disease Other        PACEMAKER DEFIB.      Past Medical History:  Diagnosis Date   Atrial fibrillation (HCC)    Benign tumor of meninges (HCC)    Chronic combined systolic and diastolic CHF, NYHA class 2 and ACA/AHA stage C 01/2018   Exacerbated by A. fib; EF 35 to 40% with global HK.  Reduced cardiac output and index (3.2/1.9).   -Associated moderate pulmonary hypertension-(mean PA P 38 mmHg)   GERD (gastroesophageal reflux disease)    occ. in past   Headache(784.0)    HTN (hypertension), benign    pt. states no hypertension   NICM (nonischemic cardiomyopathy) (HCC) 01/2018   Echo with EF of 35 to 40%.  Global HK.  Mild to moderate MR.  Mild nonobstructive CAD by cath.  Cardiac output/index 3.2/1.9   Pneumonia    ? as child   PONV (postoperative nausea and vomiting)    Seizures (HCC)    positive for HBP     Past Surgical History:  Procedure Laterality Date   CARDIOVERSION N/A 02/24/2018   Procedure: CARDIOVERSION;  Surgeon: Quintella Reichert, MD;  Location: [facility];  Service: Cardiovascular;  Laterality: N/A;   CATARACT EXTRACTION, BILATERAL Bilateral    CHOLECYSTECTOMY     COLONOSCOPY WITH PROPOFOL N/A 11/30/2012   Procedure: COLONOSCOPY WITH PROPOFOL;  Surgeon: Charolett Bumpers, MD;  Location: [facility];  Service: Endoscopy;  Laterality: N/A;   ESOPHAGOGASTRODUODENOSCOPY (EGD) WITH PROPOFOL N/A 11/30/2012   Procedure: ESOPHAGOGASTRODUODENOSCOPY (EGD) WITH PROPOFOL;  Surgeon: Charolett Bumpers, MD;  Location: [facility];  Service: Endoscopy;  Laterality: N/A;   ESOPHAGOGASTRODUODENOSCOPY (EGD) WITH PROPOFOL N/A 03/14/2015   Procedure: ESOPHAGOGASTRODUODENOSCOPY (EGD) WITH PROPOFOL;  Surgeon: Willis Modena, MD;  Location: [facility];  Service: Endoscopy;  Laterality: N/A;   EUS N/A 01/05/2013   Procedure: FULL UPPER ENDOSCOPIC ULTRASOUND (EUS) RADIAL;  Surgeon: Willis Modena, MD;  Location: [facility];  Service: Endoscopy;  Laterality: N/A;  EUS with possible FNA   EUS N/A 03/14/2015   Procedure: UPPER ENDOSCOPIC ULTRASOUND (EUS) RADIAL;  Surgeon: Willis Modena, MD;  Location: [facility];  Service: Endoscopy;  Laterality: N/A;   EYE SURGERY Left    "few weeks ago scar tissue laser surgery"   FINE NEEDLE ASPIRATION N/A 01/05/2013   Procedure: FINE NEEDLE ASPIRATION (FNA) RADIAL;  Surgeon: Willis Modena, MD;  Location: [facility];  Service: Endoscopy;  Laterality: N/A;   KNEE ARTHROSCOPY Left    MYELOMININGOCELE REPAIR  12/1990   RIGHT/LEFT HEART CATH AND CORONARY ANGIOGRAPHY N/A 02/22/2018   Procedure: RIGHT/LEFT HEART CATH AND CORONARY ANGIOGRAPHY;  Surgeon: Laurey Morale, MD;  [facility];;  Nonobstructive CAD.  Mild to moderate pulmonary hypertension (PA P 47/24 mmHg-mean 38 mmHg.  PCWP 22 mmHg with LVEDP 24 mmHg. CO/CI = 3.2/1.9   TEE WITHOUT  CARDIOVERSION N/A 02/24/2018   Procedure: TRANSESOPHAGEAL ECHOCARDIOGRAM (TEE) - WITH DCCV;  Surgeon: Quintella Reichert, MD;  Location: [facility];; EF 35-40%.  Global HK.  No R WMA. Mild AI, Mild-Mod MR. Severe LA dilation (no LAA thrombus).  Severely reduced RV function.  Mild RA dilation --successful DCCV   TRANSTHORACIC ECHOCARDIOGRAM  02/18/2018   EF 35 to 40%.  Unable to assess diastolic function because of A. fib.  No obvious regional wall motion abnormality.  Global hypokinesis.  Severe LA dilation.  Mild-moderate MR.  Mild AI without AS   TRANSTHORACIC ECHOCARDIOGRAM  11/2019   CHF with A. fib RVR:  EF 45 to 50%.  Mildly reduced function.  GR 3 DD with elevated LAP.  Estimated RVP/PAP 72 to 73 mmHg.  Moderate MR and TR.  Mild to moderate PR.  Suggested CVP 15 mmHg.  Interestingly, despite the severe elevated pressures.  Both right and left atria were both normal size.   VAGINAL HYSTERECTOMY  1983     Current Outpatient Medications on File Prior to Visit  Medication Sig Dispense Refill   amiodarone (PACERONE) 200 MG tablet Take 1 tablet (200 mg total) by mouth daily. 30 tablet 6   apixaban (ELIQUIS) 5 MG TABS tablet Take 1 tablet (5 mg total) by mouth 2 (two) times daily. 180 tablet 1   furosemide (LASIX) 40 MG tablet 1 tablet Orally once a day if needed     losartan (COZAAR) 50 MG tablet Take 1 tablet (50 mg total) by mouth daily. SCHEDULE OFFICE VISIT FOR FUTURE REFILLS. 30 tablet 0   metoprolol succinate (TOPROL-XL) 25 MG 24 hr tablet Take 1 tablet (25 mg total) by mouth daily. Stop taking after 2 weeks starting 0n 02/18/22) 90 tablet 1   Multiple Vitamin (MULTIVITAMIN WITH MINERALS) TABS tablet Take 1 tablet by mouth daily.     pravastatin (PRAVACHOL) 40 MG tablet Take 40 mg by mouth daily.      Calcium-Magnesium-Zinc (CAL-MAG-ZINC PO) Take 1 tablet by mouth daily at 12 noon. (Patient not taking: Reported on 03/03/2022)     Cholecalciferol (VITAMIN D-3) 25 MCG (1000 UT) CAPS Take 1,000  Units by mouth daily. Take 1 capsule daily (Patient not taking: Reported on 03/03/2022)     No current facility-administered medications on file prior to visit.    Allergies  Allergen Reactions   Dilantin [Phenytoin Sodium Extended]      Stupor, ataxia.    Hydrocodone Nausea And Vomiting     DIAGNOSTIC DATA (LABS, IMAGING, TESTING) - I reviewed patient records, labs, notes, testing and imaging myself where available.  Lab Results  Component Value Date   WBC 7.4 03/03/2022   HGB 11.8 03/03/2022   HCT 35.7 03/03/2022   MCV 81 03/03/2022   PLT 296 03/03/2022      Component Value Date/Time   NA 139 03/03/2022 1607   K 4.9 03/03/2022 1607   CL 102 03/03/2022 1607   CO2 22 03/03/2022 1607   GLUCOSE 87 03/03/2022 1607   GLUCOSE 95 01/10/2021 0209   BUN 28 (H) 03/03/2022 1607   CREATININE 0.93 03/03/2022 1607   CALCIUM 9.6 03/03/2022 1607   PROT 6.3 03/03/2022 1607   ALBUMIN 4.3 03/03/2022 1607   AST 47 (H) 03/03/2022 1607   ALT 52 (H) 03/03/2022 1607   ALKPHOS 75 03/03/2022 1607   BILITOT 0.6 03/03/2022 1607   GFRNONAA 54 (L) 01/10/2021 0209   GFRAA 63 02/22/2020 0946   Lab Results  Component Value Date   CHOL 194 02/22/2020   HDL 64 02/22/2020   LDLCALC 114 (H) 02/22/2020   TRIG 91 02/22/2020   CHOLHDL 3.0 02/22/2020   No results found for: "HGBA1C" No results found for: "VITAMINB12" Lab Results  Component Value Date   TSH 1.380 03/03/2022    PHYSICAL EXAM:  Today's Vitals   01/06/23 1310  BP: 134/69  Pulse: 78  Weight: 130 lb 14.4 oz (59.4 kg)  Height: 5\' 2"  (1.575 m)   Body mass index is 23.94 kg/m.   Wt Readings from Last 3 Encounters:  01/06/23 130 lb 14.4 oz (59.4 kg)  03/03/22 139 lb 8 oz (  63.3 kg)  02/17/22 142 lb 6.4 oz (64.6 kg)     Ht Readings from Last 3 Encounters:  01/06/23 5\' 2"  (1.575 m)  03/03/22 5\' 1"  (1.549 m)  02/17/22 5\' 1"  (1.549 m)      General: The patient is awake, alert and appears not in acute distress. The patient  is well groomed. Head: Normocephalic, atraumatic. Neck is supple.   Dental status: biological , severe overbite -  Cardiovascular:  Regular rate and cardiac rhythm by pulse,  without distended neck veins. Respiratory: Lungs are clear to auscultation.  Skin:  Without evidence of ankle edema, or rash. Trunk: The patient's posture is erect.   NEUROLOGIC EXAM: The patient is awake and alert, oriented to place and time.   Memory subjective described as intact.  Attention span & concentration ability appears normal.  Speech is fluent,  without  dysarthria, dysphonia or aphasia.  Mood and affect are appropriate.   Cranial nerves: no loss of smell or taste reported  Pupils are equal and briskly reactive to light. Extraocular movements in vertical and horizontal planes were intact and without nystagmus. No Diplopia. Visual fields by finger perimetry are intact. The patient has a larger field of peripheral vision on the  right than on the left. No concern about driving.  Hearing was intact to soft voice and finger rubbing.    Facial sensation intact to fine touch.  Facial motor strength is symmetric and tongue and uvula move midline.  Neck ROM : rotation, tilt and flexion extension were normal for age and shoulder shrug was symmetrical.    Motor exam:  Symmetric bulk, tone and ROM.   Normal tone without cog wheeling, symmetric grip strength .    ASSESSMENT AND PLAN 82 y.o. year old female  here with:    1) status post meningeoma surgery in 1993- and had a seizure 3 years later, still has 4 meningeomata in various regions of the brain, the largest on the left, and  slowly growing.    2) Keppra refilled - 500 mg bid  5 refills, 30 day supplies.   3)  ophthalmology revisits yearly, but no recent visual field revaluation.   4) remote history of OSA but now low BMI, ( when she was tested her BMI was 33 )  still has  overbite.   5) no concerns about memory.   Follow up through our NP  within 12 months.   I would like to thank Lupita Raider, MD and Lupita Raider, Md [address] [place],  [place] [address] for allowing me to meet with and to take care of this pleasant patient.    After spending a total time of  30  minutes face to face and additional time for physical and neurologic examination, review of laboratory studies,  personal review of imaging studies, reports and results of other testing and review of referral information / records as far as provided in visit,   Electronically signed by: Melvyn Novas, MD 01/06/2023 1:58 PM  [facility] Board certified by The [employer] of Sleep Medicine and Diplomate of the [employer] of Sleep Medicine. Board certified In Neurology through the ABPN, Fellow of the [employer] of Neurology.

## 2023-01-06 NOTE — Patient Instructions (Addendum)
There are postcraniotomy changes in the left anterior parietal convexity consistent with removal of previous meningioma.  There is adjacent encephalomalacia and gliosis.  There does not appear to be any recurrent tumor and this appears unchanged compared to the previous MRI.   Additionally there are 4 meningiomas: left perisylvian meningioma measures 12 x 11 x 13 mm (AP x transverse x CC).  Right parafalcine meningioma measures 6.9 x 7 (AP x transverse x CC).  Right frontal meningioma measures 9 x 9 x 7 mm (AP x transverse x CC).  Left frontal meningioma measures 5 x 5 x 4 mm (AP x transverse x CC).  The perisylvian meningioma has increased in size from 9 x 5 x 8 mm.  The other 3 are unchanged   ASSESSMENT AND PLAN 82 y.o. year old female here with:    1) status post meningeoma surgery in 1993- and had a seizure 3 years later, still has 4 meningeomata in various regions of the brain, the largest on the left, and  slowly growing.    2) Keppra refilled - 500 mg bid  5 refills, 30 day supplies.   3)  ophthalmology revisits yearly, but no recent visual field revaluation.   4) remote history of OSA but now low BMI, ( when she was tested her BMI was 33 )  still has  overbite.   5) no concerns about memory.   Follow up through our NP within 12 months.

## 2023-01-11 DIAGNOSIS — R051 Acute cough: Secondary | ICD-10-CM | POA: Diagnosis not present

## 2023-02-18 ENCOUNTER — Ambulatory Visit: Payer: Medicare PPO | Admitting: Cardiology

## 2023-03-04 ENCOUNTER — Encounter: Payer: Self-pay | Admitting: Cardiology

## 2023-03-04 ENCOUNTER — Ambulatory Visit: Payer: Medicare PPO | Attending: Cardiology | Admitting: Cardiology

## 2023-03-04 VITALS — BP 150/60 | HR 49 | Ht 62.0 in | Wt 132.6 lb

## 2023-03-04 DIAGNOSIS — I480 Paroxysmal atrial fibrillation: Secondary | ICD-10-CM | POA: Diagnosis not present

## 2023-03-04 DIAGNOSIS — I10 Essential (primary) hypertension: Secondary | ICD-10-CM

## 2023-03-04 DIAGNOSIS — Z7901 Long term (current) use of anticoagulants: Secondary | ICD-10-CM

## 2023-03-04 DIAGNOSIS — I5042 Chronic combined systolic (congestive) and diastolic (congestive) heart failure: Secondary | ICD-10-CM | POA: Diagnosis not present

## 2023-03-04 DIAGNOSIS — I428 Other cardiomyopathies: Secondary | ICD-10-CM

## 2023-03-04 DIAGNOSIS — E782 Mixed hyperlipidemia: Secondary | ICD-10-CM | POA: Diagnosis not present

## 2023-03-04 DIAGNOSIS — Z79899 Other long term (current) drug therapy: Secondary | ICD-10-CM | POA: Diagnosis not present

## 2023-03-04 NOTE — Progress Notes (Signed)
 Cardiology Office Note:  .   Date:  03/09/2023  ID:  Maureen [name], DOB Mar 27, 1940, MRN [id number] PCP: Lupita Raider, MD  [facility] Providers Cardiologist:  Bryan Lemma, MD     Chief Complaint  Patient presents with   Follow-up   Atrial Fibrillation    Remittent short spells, she knows she is in it but not overly symptomatic    Patient Profile: .     Maureen [name] is a 83 y.o. female  with a PMH notable for PAF as well as HFrEF who presents here for annual follow-up at the request of Lupita Raider, MD.    Maureen [name] was last seen on February 17, 2022-doing very well.  Noted with maybe 1 or 2 episodes of A-fib lasting up to hours over the past year.  Usually associated with somewhat of a stressful situation-(when her husband was being "difficult ".  (Husband was recently diagnosed with dementia which leads to increased stress as the caregiver).  Follow-up on dizziness but syncope or near syncope.  Rarely using the as needed Lasix maybe 2-3 times a month.  No bleeding on Eliquis.  Otherwise negative cardiac ROS. => Recommendation was to wean off of Toprol as the amiodarone dose was continued.  Lipid panel, LFTs, ESR, and CRP were ordered.  Subjective  Discussed the use of AI scribe software for clinical note transcription with the patient, who gave verbal consent to proceed.  History of Present Illness   Maureen [name] is an 83 year old female with atrial fibrillation who presents for a cardiology follow-up. She is accompanied by her granddaughter, who is a Engineer, civil (consulting), and her great-grandson.  She experiences intermittent episodes of atrial fibrillation, which are not long-lasting and do not occur daily. She is aware of these episodes and describes feeling them. No chest pain, pressure, tightness, palpitations, or racing heart. She denies major symptoms of heart failure such as orthopnea, paroxysmal nocturnal dyspnea, or significant peripheral edema. Occasionally, she  experiences a lack of energy, particularly when caring for her husband, but otherwise reports having a lot of energy. No unexplained fatigue, dizziness, or syncope.     She denies any symptoms of chest pain or pressure at rest or exertion. No syncope or near syncope.  No TIA or emesis fugax symptoms.  She is not currently taking Toprol, as it was discontinued after her last visit, because of bradycardia. She is unsure of her blood pressure readings at home but recalls normal readings at her doctor's office, with some variability noted in past readings. She has not had any recent lab work since April of the previous year, except for an A1c and chemistry panel in the fall. She notes significant "intentional" weight loss since her last visit, which she considers positive. No gastrointestinal symptoms such as hematochezia or hematuria, and no epistaxis. No unexplained coughing or wheezing.  Her social history includes significant involvement from her family, particularly her granddaughter and great-grandson, in assisting with her care at home. She mentions increased difficulty in caring for her husband since he broke his hip in December.    Other review of symptoms notable for musculoskeletal symptoms and joint pains and some myalgias.  Sometimes her legs get weak.  Notable weight loss, minimal memory loss.  Some caregiver stress noted.    Objective   Medications - Amiodarone 200 mg daily - Losartan 50 mg daily - Eliquis 5 mg twice daily - Pravastatin 40 mg daily - Keppra 500 mg twice  daily PRNs:- Aspirin 81 mg as needed'; -furosemide 40 mg  Studies Reviewed: Marland Kitchen   EKG Interpretation Date/Time:  Wednesday March 04 2023 13:27:22 EST Ventricular Rate:  49 PR Interval:  200 QRS Duration:  132 QT Interval:  474 QTC Calculation: 428 R Axis:   -53  Text Interpretation: Sinus bradycardia Left axis deviation Left ventricular hypertrophy with QRS widening and repolarization abnormality ( R in aVL  , Cornell product , Romhilt-Estes ) When compared with ECG of 08-Jan-2021 13:09, No significant change since last tracing Confirmed by Bryan Lemma ([id number]) on 03/04/2023 1:33:29 PM    No new studies  Labs from October 2024: A1c 5.7; NA 139, K4.5, BUN 30,Cr 0.82, GLU 98.  LFTs normal; TSH 1.54. Lipids from 04/10/2021: TC 148, TG 90, HDL 55, LDL 76.  CTA Chest-PE Protocol (12/10/2019): No PE.  Moderate right and small left pleural effusions with associated atelectasis.  Mild to moderate free fluid in the pelvis and inferior paracolic gutter.  Colonic diverticulitis.  Aortic atherosclerosis and minimal atherosclerotic coronary calcification  Risk Assessment/Calculations:    CHA2DS2-VASc Score = 8   This indicates a 10.8% annual risk of stroke. The patient's score is based upon: CHF History: 1 HTN History: 1 Diabetes History: 0 Stroke History: 2 Vascular Disease History: 1 Age Score: 2 Gender Score: 1   Currently on DOAC: Eliquis 5 mg twice daily Acceptable blood pressure for age       Physical Exam:   VS:  BP (!) 150/60 (BP Location: Left Arm, Patient Position: Sitting, Cuff Size: Normal)   Pulse (!) 49   Ht 5\' 2"  (1.575 m)   Wt 132 lb 9.6 oz (60.1 kg)   SpO2 98%   BMI 24.25 kg/m    Wt Readings from Last 3 Encounters:  03/04/23 132 lb 9.6 oz (60.1 kg)  01/06/23 130 lb 14.4 oz (59.4 kg)  03/03/22 139 lb 8 oz (63.3 kg)    GEN: Well nourished, well developed in no acute distress; healthy-appearing.  Notable weight loss NECK: No JVD; No carotid bruits CARDIAC: Regular rhythm with bradycardia, Normal S1, S2; no murmurs, rubs, gallops RESPIRATORY:  Clear to auscultation without rales, wheezing or rhonchi ; nonlabored, good air movement. ABDOMEN: Soft, non-tender, non-distended EXTREMITIES: Trivial puffy edema; No deformity      ASSESSMENT AND PLAN: .    Problem List Items Addressed This Visit       Cardiology Problems   Chronic combined systolic and diastolic heart  failure (HCC) (Chronic)   Relevant Medications   aspirin EC 81 MG tablet   amiodarone (PACERONE) 200 MG tablet   losartan (COZAAR) 50 MG tablet   Other Relevant Orders   TSH (Completed)   Hepatic function panel (Completed)   Lipid panel (Completed)   C-reactive protein (Completed)   Sedimentation rate (Completed)   Essential hypertension - Primary (Chronic)   Somewhat elevated BP today on losartan 50 mg monotherapy.  Low threshold to titrate up to 50 mg, but she has had some dizziness off and on and therefore concerned about potential orthostasis. -Continue losartan 50 mg daily      Relevant Medications   aspirin EC 81 MG tablet   amiodarone (PACERONE) 200 MG tablet   losartan (COZAAR) 50 MG tablet   Other Relevant Orders   EKG 12-Lead (Completed)   TSH (Completed)   Hepatic function panel (Completed)   Lipid panel (Completed)   C-reactive protein (Completed)   Sedimentation rate (Completed)   Mixed hyperlipidemia (Chronic)  Remains on pravastatin.  Labs monitored by PCP.  Last lipids were from March 2023 with an LDL of 76.  -Advise patient to come in fasting for labs in the next week or two.  (Lipid panel along with amiodarone monitoring labs ordered) -Review results and adjust management as necessary. => Defer to PCP.      Relevant Medications   aspirin EC 81 MG tablet   amiodarone (PACERONE) 200 MG tablet   losartan (COZAAR) 50 MG tablet   Other Relevant Orders   TSH (Completed)   Hepatic function panel (Completed)   Lipid panel (Completed)   C-reactive protein (Completed)   Sedimentation rate (Completed)   NICM (nonischemic cardiomyopathy) (HCC) (Chronic)   Mildly reduced EF on echo but no active CHF symptoms.  Only really symptomatic if she has prolonged A-fib.  Hence the reason for amiodarone. Feels much better with weight loss. Euvolemic on exam with no PND orthopnea.  Trivial edema.  Not requiring her diuretic. -Continue losartan 50 mg for afterload reduction  along with PRN Lasix 40 mg.       Relevant Medications   aspirin EC 81 MG tablet   amiodarone (PACERONE) 200 MG tablet   losartan (COZAAR) 50 MG tablet   Paroxysmal atrial fibrillation (HCC): CHA2DS2-VASc score 8 (Chronic)   No recent episodes of AFib reported-just minor short-lived bursts of fast heart rates. . No symptoms of heart failure such as shortness of breath or leg swelling. No chest pain or palpitations.  Remains bradycardic even off of beta-blocker. -Reduce amiodarone to 100 mg daily => continue standard monitoring for amiodarone  -Annual follow-up recommended unless symptoms occur.      Relevant Medications   aspirin EC 81 MG tablet   amiodarone (PACERONE) 200 MG tablet   losartan (COZAAR) 50 MG tablet   Other Relevant Orders   TSH (Completed)   Hepatic function panel (Completed)   Lipid panel (Completed)   C-reactive protein (Completed)   Sedimentation rate (Completed)     Other   Long term (current) use of anticoagulants; CHA2DS2-VASc score of 8 (Chronic)   Now back on Eliquis-compatible with Keppra. CHA2DS2-VASc score is 8 -Continue Eliquis 5 mg twice daily Okay to hold Eliquis 2 to 3 days preop for surgeries or procedures Okay to hold Eliquis 1 to 2 days for significant bleeding or bruising.      Relevant Orders   TSH (Completed)   Hepatic function panel (Completed)   Lipid panel (Completed)   C-reactive protein (Completed)   Sedimentation rate (Completed)   Long term current use of amiodarone (Chronic)   -Order labs including liver function tests (LFTs), thyroid function tests (TFTs), lipids, Erythrocyte Sedimentation Rate (ESR), and C-Reactive Protein (CRP) to monitor for potential side effects of Amiodarone. Will hold off on PFTs.      Relevant Orders   TSH (Completed)   Hepatic function panel (Completed)   Lipid panel (Completed)   C-reactive protein (Completed)   Sedimentation rate (Completed)    Follow-Up: Return in about 1 year (around  03/03/2024) for Routine follow up with me, [employer].     Signed, Marykay Lex, MD, MS Bryan Lemma, M.D., M.S. Interventional Cardiologist  [facility]  Pager # [phone] Phone # [phone] [address] [place], [place] [address]

## 2023-03-04 NOTE — Patient Instructions (Signed)
Medication Instructions:   No changes  *If you need a refill on your cardiac medications before your next appointment, please call your pharmacy*   Lab Work: fasting  TSH Lipid Hepatic Sed rate CRP   If you have labs (blood work) drawn today and your tests are completely normal, you will receive your results only by: MyChart Message (if you have MyChart) OR A paper copy in the mail If you have any lab test that is abnormal or we need to change your treatment, we will call you to review the results.   Testing/Procedures:  Not needed  Follow-Up: At [facility], you and your health needs are our priority.  As part of our continuing mission to provide you with exceptional heart care, we have created designated Provider Care Teams.  These Care Teams include your primary Cardiologist (physician) and Advanced Practice Providers (APPs -  Physician Assistants and Nurse Practitioners) who all work together to provide you with the care you need, when you need it.     Your next appointment:   12 month(s)  The format for your next appointment:   In Person  Provider:   Bryan Lemma, MD    Other Instructions

## 2023-03-08 ENCOUNTER — Other Ambulatory Visit: Payer: Self-pay | Admitting: Cardiology

## 2023-03-09 ENCOUNTER — Encounter: Payer: Self-pay | Admitting: Cardiology

## 2023-03-09 DIAGNOSIS — Z7901 Long term (current) use of anticoagulants: Secondary | ICD-10-CM | POA: Diagnosis not present

## 2023-03-09 DIAGNOSIS — I10 Essential (primary) hypertension: Secondary | ICD-10-CM | POA: Diagnosis not present

## 2023-03-09 DIAGNOSIS — E782 Mixed hyperlipidemia: Secondary | ICD-10-CM | POA: Diagnosis not present

## 2023-03-09 DIAGNOSIS — I5042 Chronic combined systolic (congestive) and diastolic (congestive) heart failure: Secondary | ICD-10-CM | POA: Diagnosis not present

## 2023-03-09 DIAGNOSIS — Z79899 Other long term (current) drug therapy: Secondary | ICD-10-CM | POA: Diagnosis not present

## 2023-03-09 DIAGNOSIS — I480 Paroxysmal atrial fibrillation: Secondary | ICD-10-CM | POA: Diagnosis not present

## 2023-03-09 MED ORDER — LOSARTAN POTASSIUM 50 MG PO TABS: 50.0000 mg | ORAL_TABLET | Freq: Every day | ORAL | 3 refills | Status: DC

## 2023-03-09 MED ORDER — AMIODARONE HCL 200 MG PO TABS: 200.0000 mg | ORAL_TABLET | Freq: Every day | ORAL | 3 refills | Status: DC

## 2023-03-09 NOTE — Assessment & Plan Note (Signed)
 Mildly reduced EF on echo but no active CHF symptoms.  Only really symptomatic if she has prolonged A-fib.  Hence the reason for amiodarone. Feels much better with weight loss. Euvolemic on exam with no PND orthopnea.  Trivial edema.  Not requiring her diuretic. -Continue losartan 50 mg for afterload reduction along with PRN Lasix 40 mg.

## 2023-03-09 NOTE — Assessment & Plan Note (Signed)
 Somewhat elevated BP today on losartan 50 mg monotherapy.  Low threshold to titrate up to 50 mg, but she has had some dizziness off and on and therefore concerned about potential orthostasis. -Continue losartan 50 mg daily

## 2023-03-09 NOTE — Assessment & Plan Note (Signed)
 Now back on Eliquis-compatible with Keppra. CHA2DS2-VASc score is 8 -Continue Eliquis 5 mg twice daily Okay to hold Eliquis 2 to 3 days preop for surgeries or procedures Okay to hold Eliquis 1 to 2 days for significant bleeding or bruising.

## 2023-03-09 NOTE — Telephone Encounter (Signed)
 Prescription refill request for Eliquis received. Indication:afib Last office visit:2/25 Scr:0.82  10/24 Age: 83 Weight:60.1  kg  Prescription refilled

## 2023-03-09 NOTE — Assessment & Plan Note (Signed)
-  Order labs including liver function tests (LFTs), thyroid function tests (TFTs), lipids, Erythrocyte Sedimentation Rate (ESR), and C-Reactive Protein (CRP) to monitor for potential side effects of Amiodarone. Will hold off on PFTs.

## 2023-03-09 NOTE — Assessment & Plan Note (Addendum)
 Remains on pravastatin.  Labs monitored by PCP.  Last lipids were from March 2023 with an LDL of 76.  -Advise patient to come in fasting for labs in the next week or two.  (Lipid panel along with amiodarone monitoring labs ordered) -Review results and adjust management as necessary. => Defer to PCP.

## 2023-03-09 NOTE — Assessment & Plan Note (Signed)
 No recent episodes of AFib reported-just minor short-lived bursts of fast heart rates. . No symptoms of heart failure such as shortness of breath or leg swelling. No chest pain or palpitations.  Remains bradycardic even off of beta-blocker. -Reduce amiodarone to 100 mg daily => continue standard monitoring for amiodarone  -Annual follow-up recommended unless symptoms occur.

## 2023-03-10 LAB — HEPATIC FUNCTION PANEL
ALT: 28 [IU]/L (ref 0–32)
AST: 32 [IU]/L (ref 0–40)
Albumin: 4.5 g/dL (ref 3.7–4.7)
Alkaline Phosphatase: 69 [IU]/L (ref 44–121)
Bilirubin Total: 0.5 mg/dL (ref 0.0–1.2)
Bilirubin, Direct: 0.2 mg/dL (ref 0.00–0.40)
Total Protein: 6.2 mg/dL (ref 6.0–8.5)

## 2023-03-10 LAB — LIPID PANEL
Chol/HDL Ratio: 2.2 {ratio} (ref 0.0–4.4)
Cholesterol, Total: 163 mg/dL (ref 100–199)
HDL: 74 mg/dL (ref >39)
LDL Chol Calc (NIH): 75 mg/dL (ref 0–99)
Triglycerides: 76 mg/dL (ref 0–149)
VLDL Cholesterol Cal: 14 mg/dL (ref 5–40)

## 2023-03-10 LAB — SEDIMENTATION RATE: Sed Rate: 6 mm/h (ref 0–40)

## 2023-03-10 LAB — TSH: TSH: 2.21 u[IU]/mL (ref 0.450–4.500)

## 2023-03-10 LAB — C-REACTIVE PROTEIN

## 2023-03-12 ENCOUNTER — Encounter: Payer: Self-pay | Admitting: Cardiology

## 2023-03-16 ENCOUNTER — Telehealth: Payer: Self-pay | Admitting: *Deleted

## 2023-03-16 MED ORDER — AMIODARONE HCL 200 MG PO TABS: 100.0000 mg | ORAL_TABLET | Freq: Every day | ORAL | 3 refills | Status: DC

## 2023-03-16 NOTE — Telephone Encounter (Signed)
 A message from Dr Herbie Baltimore - regarding last office note. Maureen [name], my plan was for her to reduce amiodarone to 1/2 tablet daily. We can change the prescription as well.  Bryan Lemma, MD

## 2023-03-16 NOTE — Telephone Encounter (Signed)
 Called spoke to patient . Patient aware to decrease Amiodarone to 100 mg ( 1/2 tablet of 200 mg ) daily.  Patient request new prescription sent to [facility] pill pack.  RN  e-scribe Amiodarone . Patient verbalized understanding , and that she may have cut medication in half herself

## 2023-03-20 ENCOUNTER — Encounter: Payer: Self-pay | Admitting: Cardiology

## 2023-04-06 MED ORDER — AMIODARONE HCL 200 MG PO TABS: 100.0000 mg | ORAL_TABLET | Freq: Every day | ORAL | 3 refills | Status: AC

## 2023-04-30 DIAGNOSIS — I10 Essential (primary) hypertension: Secondary | ICD-10-CM | POA: Diagnosis not present

## 2023-04-30 DIAGNOSIS — D329 Benign neoplasm of meninges, unspecified: Secondary | ICD-10-CM | POA: Diagnosis not present

## 2023-04-30 DIAGNOSIS — Z0000 Encounter for general adult medical examination without abnormal findings: Secondary | ICD-10-CM | POA: Diagnosis not present

## 2023-04-30 DIAGNOSIS — E7800 Pure hypercholesterolemia, unspecified: Secondary | ICD-10-CM | POA: Diagnosis not present

## 2023-04-30 DIAGNOSIS — M8580 Other specified disorders of bone density and structure, unspecified site: Secondary | ICD-10-CM | POA: Diagnosis not present

## 2023-04-30 DIAGNOSIS — R7303 Prediabetes: Secondary | ICD-10-CM | POA: Diagnosis not present

## 2023-04-30 DIAGNOSIS — I4891 Unspecified atrial fibrillation: Secondary | ICD-10-CM | POA: Diagnosis not present

## 2023-04-30 DIAGNOSIS — I509 Heart failure, unspecified: Secondary | ICD-10-CM | POA: Diagnosis not present

## 2023-04-30 DIAGNOSIS — I420 Dilated cardiomyopathy: Secondary | ICD-10-CM | POA: Diagnosis not present

## 2023-09-04 ENCOUNTER — Other Ambulatory Visit: Payer: Self-pay | Admitting: Cardiology

## 2023-09-04 ENCOUNTER — Other Ambulatory Visit: Payer: Self-pay | Admitting: Neurology

## 2023-09-04 NOTE — Telephone Encounter (Signed)
 Prescription refill request for Eliquis received. Indication:afib Last office visit:2/25 Scr:0.82  10/24 Age: 83 Weight:60.1  kg  Prescription refilled

## 2023-12-14 DIAGNOSIS — I4891 Unspecified atrial fibrillation: Secondary | ICD-10-CM | POA: Diagnosis not present

## 2023-12-14 DIAGNOSIS — R7303 Prediabetes: Secondary | ICD-10-CM | POA: Diagnosis not present

## 2023-12-14 DIAGNOSIS — E7800 Pure hypercholesterolemia, unspecified: Secondary | ICD-10-CM | POA: Diagnosis not present

## 2023-12-14 DIAGNOSIS — I509 Heart failure, unspecified: Secondary | ICD-10-CM | POA: Diagnosis not present

## 2023-12-14 DIAGNOSIS — I10 Essential (primary) hypertension: Secondary | ICD-10-CM | POA: Diagnosis not present

## 2023-12-14 DIAGNOSIS — G40909 Epilepsy, unspecified, not intractable, without status epilepticus: Secondary | ICD-10-CM | POA: Diagnosis not present

## 2023-12-14 LAB — LAB REPORT - SCANNED
A1c: 5.8
EGFR: 67

## 2023-12-16 DIAGNOSIS — Z961 Presence of intraocular lens: Secondary | ICD-10-CM | POA: Diagnosis not present

## 2023-12-16 DIAGNOSIS — H04123 Dry eye syndrome of bilateral lacrimal glands: Secondary | ICD-10-CM | POA: Diagnosis not present

## 2023-12-16 DIAGNOSIS — H26491 Other secondary cataract, right eye: Secondary | ICD-10-CM | POA: Diagnosis not present

## 2023-12-16 DIAGNOSIS — H52203 Unspecified astigmatism, bilateral: Secondary | ICD-10-CM | POA: Diagnosis not present

## 2024-01-18 ENCOUNTER — Other Ambulatory Visit: Payer: Self-pay

## 2024-01-18 ENCOUNTER — Emergency Department (HOSPITAL_COMMUNITY)

## 2024-01-18 ENCOUNTER — Encounter (HOSPITAL_COMMUNITY): Payer: Self-pay

## 2024-01-18 ENCOUNTER — Observation Stay (HOSPITAL_COMMUNITY)

## 2024-01-18 ENCOUNTER — Observation Stay (HOSPITAL_COMMUNITY)
Admission: EM | Admit: 2024-01-18 | Discharge: 2024-01-19 | Disposition: A | Discharge status: Home / Self Care | Attending: Family Medicine | Admitting: Family Medicine

## 2024-01-18 DIAGNOSIS — E782 Mixed hyperlipidemia: Secondary | ICD-10-CM | POA: Diagnosis not present

## 2024-01-18 DIAGNOSIS — D649 Anemia, unspecified: Secondary | ICD-10-CM | POA: Diagnosis not present

## 2024-01-18 DIAGNOSIS — I5042 Chronic combined systolic (congestive) and diastolic (congestive) heart failure: Secondary | ICD-10-CM | POA: Insufficient documentation

## 2024-01-18 DIAGNOSIS — N1831 Chronic kidney disease, stage 3a: Secondary | ICD-10-CM | POA: Insufficient documentation

## 2024-01-18 DIAGNOSIS — Z79899 Other long term (current) drug therapy: Secondary | ICD-10-CM | POA: Diagnosis not present

## 2024-01-18 DIAGNOSIS — G40909 Epilepsy, unspecified, not intractable, without status epilepticus: Secondary | ICD-10-CM | POA: Diagnosis not present

## 2024-01-18 DIAGNOSIS — I371 Nonrheumatic pulmonary valve insufficiency: Secondary | ICD-10-CM

## 2024-01-18 DIAGNOSIS — I484 Atypical atrial flutter: Secondary | ICD-10-CM

## 2024-01-18 DIAGNOSIS — Z7901 Long term (current) use of anticoagulants: Secondary | ICD-10-CM | POA: Insufficient documentation

## 2024-01-18 DIAGNOSIS — I130 Hypertensive heart and chronic kidney disease with heart failure and stage 1 through stage 4 chronic kidney disease, or unspecified chronic kidney disease: Secondary | ICD-10-CM | POA: Diagnosis not present

## 2024-01-18 DIAGNOSIS — I5020 Unspecified systolic (congestive) heart failure: Secondary | ICD-10-CM | POA: Diagnosis present

## 2024-01-18 DIAGNOSIS — I429 Cardiomyopathy, unspecified: Secondary | ICD-10-CM | POA: Diagnosis not present

## 2024-01-18 DIAGNOSIS — R197 Diarrhea, unspecified: Secondary | ICD-10-CM | POA: Diagnosis not present

## 2024-01-18 DIAGNOSIS — I5023 Acute on chronic systolic (congestive) heart failure: Secondary | ICD-10-CM

## 2024-01-18 DIAGNOSIS — I4891 Unspecified atrial fibrillation: Principal | ICD-10-CM

## 2024-01-18 DIAGNOSIS — I480 Paroxysmal atrial fibrillation: Secondary | ICD-10-CM | POA: Diagnosis not present

## 2024-01-18 DIAGNOSIS — I351 Nonrheumatic aortic (valve) insufficiency: Secondary | ICD-10-CM

## 2024-01-18 DIAGNOSIS — R0602 Shortness of breath: Secondary | ICD-10-CM | POA: Diagnosis present

## 2024-01-18 LAB — TROPONIN T, HIGH SENSITIVITY
Troponin T High Sensitivity: 19 ng/L (ref 0–19)
Troponin T High Sensitivity: 24 ng/L — ABNORMAL HIGH (ref 0–19)

## 2024-01-18 LAB — RESPIRATORY PANEL BY PCR

## 2024-01-18 LAB — CBC WITH DIFFERENTIAL/PLATELET
Abs Immature Granulocytes: 0.02 K/uL (ref 0.00–0.07)
Basophils Absolute: 0 K/uL (ref 0.0–0.1)
Basophils Relative: 1 %
Eosinophils Absolute: 0 K/uL (ref 0.0–0.5)
Eosinophils Relative: 0 %
HCT: 35.3 % — ABNORMAL LOW (ref 36.0–46.0)
Hemoglobin: 10.7 g/dL — ABNORMAL LOW (ref 12.0–15.0)
Immature Granulocytes: 0 %
Lymphocytes Relative: 14 %
Lymphs Abs: 0.9 K/uL (ref 0.7–4.0)
MCH: 25.2 pg — ABNORMAL LOW (ref 26.0–34.0)
MCHC: 30.3 g/dL (ref 30.0–36.0)
MCV: 83.1 fL (ref 80.0–100.0)
Monocytes Absolute: 0.7 K/uL (ref 0.1–1.0)
Monocytes Relative: 10 %
Neutro Abs: 4.8 K/uL (ref 1.7–7.7)
Neutrophils Relative %: 75 %
Platelets: 330 K/uL (ref 150–400)
RBC: 4.25 MIL/uL (ref 3.87–5.11)
RDW: 14.9 % (ref 11.5–15.5)
WBC: 6.4 K/uL (ref 4.0–10.5)
nRBC: 0 % (ref 0.0–0.2)

## 2024-01-18 LAB — ECHOCARDIOGRAM COMPLETE
MV M vel: 4.47 m/s
MV Peak grad: 79.9 mmHg
S' Lateral: 3.8 cm
Single Plane A4C EF: 45 %

## 2024-01-18 LAB — COMPREHENSIVE METABOLIC PANEL WITH GFR
ALT: 28 U/L (ref 0–44)
AST: 37 U/L (ref 15–41)
Albumin: 4.1 g/dL (ref 3.5–5.0)
Alkaline Phosphatase: 70 U/L (ref 38–126)
Anion gap: 10 (ref 5–15)
BUN: 29 mg/dL — ABNORMAL HIGH (ref 8–23)
CO2: 26 mmol/L (ref 22–32)
Calcium: 9.1 mg/dL (ref 8.9–10.3)
Chloride: 104 mmol/L (ref 98–111)
Creatinine, Ser: 1.01 mg/dL — ABNORMAL HIGH (ref 0.44–1.00)
GFR, Estimated: 54 mL/min — ABNORMAL LOW
Glucose, Bld: 112 mg/dL — ABNORMAL HIGH (ref 70–99)
Potassium: 4.7 mmol/L (ref 3.5–5.1)
Sodium: 139 mmol/L (ref 135–145)
Total Bilirubin: 0.7 mg/dL (ref 0.0–1.2)
Total Protein: 6.1 g/dL — ABNORMAL LOW (ref 6.5–8.1)

## 2024-01-18 LAB — MAGNESIUM
Magnesium: 2.1 mg/dL (ref 1.7–2.4)
Magnesium: 2.1 mg/dL (ref 1.7–2.4)

## 2024-01-18 LAB — TSH: TSH: 3.43 u[IU]/mL (ref 0.350–4.500)

## 2024-01-18 LAB — PRO BRAIN NATRIURETIC PEPTIDE: Pro Brain Natriuretic Peptide: 1834 pg/mL — ABNORMAL HIGH

## 2024-01-18 MED ORDER — DILTIAZEM HCL-DEXTROSE 125-5 MG/125ML-% IV SOLN (PREMIX)
5.0000 mg/h | INTRAVENOUS | Status: DC
  Administered 2024-01-18: 5 mg/h via INTRAVENOUS
  Filled 2024-01-18: qty 125

## 2024-01-18 MED ORDER — AMIODARONE HCL IN DEXTROSE 360-4.14 MG/200ML-% IV SOLN
30.0000 mg/h | INTRAVENOUS | Status: DC
  Administered 2024-01-19: 30 mg/h via INTRAVENOUS
  Filled 2024-01-18: qty 200

## 2024-01-18 MED ORDER — ONDANSETRON HCL 4 MG PO TABS: 4.0000 mg | ORAL_TABLET | Freq: Four times a day (QID) | ORAL | Status: DC | PRN

## 2024-01-18 MED ORDER — AMIODARONE HCL IN DEXTROSE 360-4.14 MG/200ML-% IV SOLN
60.0000 mg/h | INTRAVENOUS | Status: DC
  Administered 2024-01-18: 60 mg/h via INTRAVENOUS
  Filled 2024-01-18: qty 200

## 2024-01-18 MED ORDER — PERFLUTREN LIPID MICROSPHERE
1.0000 mL | INTRAVENOUS | Status: AC | PRN
  Administered 2024-01-18: 3 mL via INTRAVENOUS

## 2024-01-18 MED ORDER — APIXABAN 5 MG PO TABS
5.0000 mg | ORAL_TABLET | Freq: Two times a day (BID) | ORAL | Status: DC
  Administered 2024-01-18 – 2024-01-19 (×2): 5 mg via ORAL
  Filled 2024-01-18 (×2): qty 1

## 2024-01-18 MED ORDER — ACETAMINOPHEN 325 MG PO TABS
650.0000 mg | ORAL_TABLET | Freq: Four times a day (QID) | ORAL | Status: DC | PRN
  Administered 2024-01-19: 650 mg via ORAL
  Filled 2024-01-18: qty 2

## 2024-01-18 MED ORDER — LEVETIRACETAM 500 MG PO TABS
500.0000 mg | ORAL_TABLET | Freq: Two times a day (BID) | ORAL | Status: DC
  Administered 2024-01-18 – 2024-01-19 (×2): 500 mg via ORAL
  Filled 2024-01-18 (×2): qty 1

## 2024-01-18 MED ORDER — DILTIAZEM LOAD VIA INFUSION
10.0000 mg | Freq: Once | INTRAVENOUS | Status: AC
  Administered 2024-01-18: 10 mg via INTRAVENOUS
  Filled 2024-01-18: qty 10

## 2024-01-18 MED ORDER — LACTATED RINGERS IV BOLUS
500.0000 mL | Freq: Once | INTRAVENOUS | Status: AC
  Administered 2024-01-18: 500 mL via INTRAVENOUS

## 2024-01-18 MED ORDER — SODIUM CHLORIDE 0.9% FLUSH
3.0000 mL | Freq: Two times a day (BID) | INTRAVENOUS | Status: DC
  Administered 2024-01-18: 3 mL via INTRAVENOUS

## 2024-01-18 MED ORDER — ACETAMINOPHEN 650 MG RE SUPP: 650.0000 mg | Freq: Four times a day (QID) | RECTAL | Status: DC | PRN

## 2024-01-18 MED ORDER — ONDANSETRON HCL 4 MG/2ML IJ SOLN: 4.0000 mg | Freq: Four times a day (QID) | INTRAMUSCULAR | Status: DC | PRN

## 2024-01-18 MED ORDER — PRAVASTATIN SODIUM 40 MG PO TABS
40.0000 mg | ORAL_TABLET | Freq: Every day | ORAL | Status: DC
  Administered 2024-01-18 – 2024-01-19 (×2): 40 mg via ORAL
  Filled 2024-01-18 (×2): qty 1

## 2024-01-18 MED ORDER — AMIODARONE LOAD VIA INFUSION
150.0000 mg | Freq: Once | INTRAVENOUS | Status: AC
  Administered 2024-01-18: 150 mg via INTRAVENOUS
  Filled 2024-01-18: qty 83.34

## 2024-01-18 MED ORDER — ALBUTEROL SULFATE (2.5 MG/3ML) 0.083% IN NEBU: 2.5000 mg | INHALATION_SOLUTION | Freq: Four times a day (QID) | RESPIRATORY_TRACT | Status: DC | PRN

## 2024-01-18 MED ORDER — FUROSEMIDE 10 MG/ML IJ SOLN
40.0000 mg | Freq: Once | INTRAMUSCULAR | Status: AC
  Administered 2024-01-18: 40 mg via INTRAVENOUS
  Filled 2024-01-18: qty 4

## 2024-01-18 MED ORDER — DILTIAZEM HCL 25 MG/5ML IV SOLN
10.0000 mg | Freq: Once | INTRAVENOUS | Status: AC
  Administered 2024-01-18: 10 mg via INTRAVENOUS
  Filled 2024-01-18: qty 5

## 2024-01-18 MED ORDER — FUROSEMIDE 10 MG/ML IJ SOLN
40.0000 mg | Freq: Two times a day (BID) | INTRAMUSCULAR | Status: DC
  Administered 2024-01-19: 40 mg via INTRAVENOUS
  Filled 2024-01-18: qty 4

## 2024-01-18 NOTE — ED Notes (Signed)
 CCMD called for patient

## 2024-01-18 NOTE — H&P (View-Only) (Signed)
 "  Cardiology Consultation   Patient ID: Maureen [name] MRN: [id number]; DOB: Aug 06, 1940  Admit date: 01/18/2024 Date of Consult: 01/18/2024  PCP:  Loreli Kins, MD   [facility] Providers Cardiologist:  Alm Clay, MD   {   Patient Profile: Maureen [name] is a 83 y.o. female with a hx of seizure disorder, paroxysmal atrial fibrillation on Eliquis , combined systolic and diastolic heart failure, hypertension, nonischemic cardiomyopathy, and hyperlipidemia who is being seen 01/18/2024 for the evaluation of atrial fibrillation at the request of Rondell Smith.  History of Present Illness: Maureen [name] is an 83 year old female with prior cardiac history listed below  On 01/2018 the patient was hospitalized for A-fib with RVR.  Echo at about this time showed a reduced LVEF of 35 to 40%.  Patient had a left and right heart cath on 02/2018 that showed mild to moderate pulmonary hypertension, nonobstructive CAD and low cardiac output.  She underwent a TEE cardioversion on 02/2018.  Patient was seen in the hospital again for A-fib with RVR on 11/2019.  A TTE was done and her LVEF was moderately reduced at 45 to 50%.  TTE on 12/2020 showed a LVEF of 60 to 65%, no RWMA G2 DD, normal RV systolic function, and mild aortic valve regurgitation.  Patient presented to the hospital for atrial fibrillation and dyspnea on exertion.  She was found to be in atrial fibrillation with RVR.  Intravenous diltiazem  did not provide sufficient rate control and she was started on  IV amiodarone .  Labs showed a potassium of 4.7, creatinine of 1.01, magnesium of 2.1, elevated proBNP of 1834, troponin T of 19, hemoglobin of 10.7,and  WBC count of 6.4.  20 pathogen respiratory panel negative.  Chest x-ray showed Perihilar and bibasilar interstitial opacities with possible trace bilateral pleural effusions.  EKG showed atrial fibrillation with a rate of 114, a nonspecific IVCD, and LVH.  TTE showed a  reduced LVEF of 35 to 40%, global hypokinesis, mild concentric LVH, moderately reduced RV systolic function, mild to moderate MR, moderate AR, and IVC with less than 50% respiratory variability.  Past Medical History:  Diagnosis Date   Atrial fibrillation (HCC)    Benign tumor of meninges (HCC)    Chronic combined systolic and diastolic CHF, NYHA class 2 and ACA/AHA stage C 01/2018   Exacerbated by A. fib; EF 35 to 40% with global HK.  Reduced cardiac output and index (3.2/1.9).   -Associated moderate pulmonary hypertension-(mean PA P 38 mmHg)   GERD (gastroesophageal reflux disease)    occ. in past   Headache(784.0)    HTN (hypertension), benign    pt. states no hypertension   NICM (nonischemic cardiomyopathy) (HCC) 01/2018   Echo with EF of 35 to 40%.  Global HK.  Mild to moderate MR.  Mild nonobstructive CAD by cath.  Cardiac output/index 3.2/1.9   Pneumonia    ? as child   PONV (postoperative nausea and vomiting)    Seizures (HCC)    positive for HBP    Past Surgical History:  Procedure Laterality Date   CARDIOVERSION N/A 02/24/2018   Procedure: CARDIOVERSION;  Surgeon: Shlomo Wilbert SAUNDERS, MD;  Location: [facility] ENDOSCOPY;  Service: Cardiovascular;  Laterality: N/A;   CATARACT EXTRACTION, BILATERAL Bilateral    CHOLECYSTECTOMY     COLONOSCOPY WITH PROPOFOL  N/A 11/30/2012   Procedure: COLONOSCOPY WITH PROPOFOL ;  Surgeon: Gladis MARLA Louder, MD;  Location: [facility] ENDOSCOPY;  Service: Endoscopy;  Laterality: N/A;   ESOPHAGOGASTRODUODENOSCOPY (EGD) WITH PROPOFOL   N/A 11/30/2012   Procedure: ESOPHAGOGASTRODUODENOSCOPY (EGD) WITH PROPOFOL ;  Surgeon: Gladis MARLA Louder, MD;  Location: [facility] ENDOSCOPY;  Service: Endoscopy;  Laterality: N/A;   ESOPHAGOGASTRODUODENOSCOPY (EGD) WITH PROPOFOL  N/A 03/14/2015   Procedure: ESOPHAGOGASTRODUODENOSCOPY (EGD) WITH PROPOFOL ;  Surgeon: Elsie Cree, MD;  Location: [facility] ENDOSCOPY;  Service: Endoscopy;  Laterality: N/A;   EUS N/A 01/05/2013   Procedure: FULL UPPER  ENDOSCOPIC ULTRASOUND (EUS) RADIAL;  Surgeon: Elsie Cree, MD;  Location: [facility] ENDOSCOPY;  Service: Endoscopy;  Laterality: N/A;  EUS with possible FNA   EUS N/A 03/14/2015   Procedure: UPPER ENDOSCOPIC ULTRASOUND (EUS) RADIAL;  Surgeon: Elsie Cree, MD;  Location: [facility] ENDOSCOPY;  Service: Endoscopy;  Laterality: N/A;   EYE SURGERY Left    few weeks ago scar tissue laser surgery   FINE NEEDLE ASPIRATION N/A 01/05/2013   Procedure: FINE NEEDLE ASPIRATION (FNA) RADIAL;  Surgeon: Elsie Cree, MD;  Location: [facility] ENDOSCOPY;  Service: Endoscopy;  Laterality: N/A;   KNEE ARTHROSCOPY Left    MYELOMININGOCELE REPAIR  12/1990   RIGHT/LEFT HEART CATH AND CORONARY ANGIOGRAPHY N/A 02/22/2018   Procedure: RIGHT/LEFT HEART CATH AND CORONARY ANGIOGRAPHY;  Surgeon: Rolan Ezra RAMAN, MD;  Location: [facility] INVASIVE CV LAB;;  Nonobstructive CAD.  Mild to moderate pulmonary hypertension (PA P 47/24 mmHg-mean 38 mmHg.  PCWP 22 mmHg with LVEDP 24 mmHg. CO/CI = 3.2/1.9   TEE WITHOUT CARDIOVERSION N/A 02/24/2018   Procedure: TRANSESOPHAGEAL ECHOCARDIOGRAM (TEE) - WITH DCCV;  Surgeon: Shlomo Wilbert SAUNDERS, MD;  Location: [facility] ENDOSCOPY;; EF 35-40%.  Global HK.  No R WMA. Mild AI, Mild-Mod MR. Severe LA dilation (no LAA thrombus).  Severely reduced RV function.  Mild RA dilation --successful DCCV   TRANSTHORACIC ECHOCARDIOGRAM  02/18/2018   EF 35 to 40%.  Unable to assess diastolic function because of A. fib.  No obvious regional wall motion abnormality.  Global hypokinesis.  Severe LA dilation.  Mild-moderate MR.  Mild AI without AS   TRANSTHORACIC ECHOCARDIOGRAM  11/2019   CHF with A. fib RVR:  EF 45 to 50%.  Mildly reduced function.  GR 3 DD with elevated LAP.  Estimated RVP/PAP 72 to 73 mmHg.  Moderate MR and TR.  Mild to moderate PR.  Suggested CVP 15 mmHg.  Interestingly, despite the severe elevated pressures.  Both right and left atria were both normal size.   VAGINAL HYSTERECTOMY  1983     Home Medications:  Prior to  Admission medications  Medication Sig Start Date End Date Taking? Authorizing Provider  amiodarone  (PACERONE ) 200 MG tablet Take 0.5 tablets (100 mg total) by mouth daily. 04/06/23   Anner Alm ORN, MD  apixaban  (ELIQUIS ) 5 MG TABS tablet Take 1 tablet by mouth twice daily. 09/04/23   Anner Alm ORN, MD  aspirin  EC 81 MG tablet Take 81 mg by mouth as needed. 09/17/17   [provider]  Calcium  Carb-Cholecalciferol  (OYSTER SHELL CALCIUM  W/D) 500-5 MG-MCG TABS Take 1 tablet by mouth daily. 09/17/17   [provider]  Calcium -Magnesium-Zinc (CAL-MAG-ZINC PO) Take 1 tablet by mouth daily at 12 noon.    [provider]  Cholecalciferol  (VITAMIN D -3) 25 MCG (1000 UT) CAPS Take 1,000 Units by mouth daily. Take 1 capsule daily    [provider]  furosemide  (LASIX ) 40 MG tablet 1 tablet Orally once a day if needed 12/12/19   [provider]  levETIRAcetam  (KEPPRA ) 500 MG tablet Take 1 tablet by mouth twice daily. 09/07/23   Dohmeier, Dedra, MD  losartan  (COZAAR ) 50 MG  tablet Take 1 tablet (50 mg total) by mouth daily. 03/09/23   Anner Alm ORN, MD  metoprolol  succinate (TOPROL -XL) 25 MG 24 hr tablet Take 1 tablet (25 mg total) by mouth daily. Stop taking after 2 weeks starting 0n 02/18/22) 02/17/22   Anner Alm ORN, MD  Multiple Vitamin (MULTIVITAMIN WITH MINERALS) TABS tablet Take 1 tablet by mouth daily.    [provider]  pravastatin  (PRAVACHOL ) 40 MG tablet Take 40 mg by mouth daily.  05/05/13   [provider]    Scheduled Meds:  amiodarone   150 mg Intravenous Once   apixaban   5 mg Oral BID   [START ON 01/19/2024] furosemide   40 mg Intravenous BID   levETIRAcetam   500 mg Oral BID   pravastatin   40 mg Oral Daily   sodium chloride  flush  3 mL Intravenous Q12H   Continuous Infusions:  amiodarone      Followed by   NOREEN ON 01/19/2024] amiodarone      PRN Meds: acetaminophen  **OR** acetaminophen , albuterol , ondansetron  **OR**  ondansetron  (ZOFRAN ) IV  Allergies:   Allergies[1]  Social History:   Social History   Socioeconomic History   Marital status: Married    Spouse name: Maureen [name]   Number of children: 2   Years of education: Master's   Highest education level: Not on file  Occupational History   Occupation: retired    Comment: [employer] in 1995    Employer: RETIRED  Tobacco Use   Smoking status: Never   Smokeless tobacco: Never  Vaping Use   Vaping status: Never Used  Substance and Sexual Activity   Alcohol use: Not Currently    Comment: once monthly   Drug use: No   Sexual activity: Not on file  Other Topics Concern   Not on file  Social History Narrative   She retired in 1995 from [employer]. Lives at home with her husband, Maureen [name]. They have a son age 50 and a  son age 72. Cafffeine once daily. a week and alcohol once a month. Denies tobacco and illicit drug use.    Patient is left handed.   Patient has a Master's degree.   Social Drivers of Health   Tobacco Use: Low Risk (01/18/2024)   Patient History    Smoking Tobacco Use: Never    Smokeless Tobacco Use: Never    Passive Exposure: Not on file  Financial Resource Strain: Not on file  Food Insecurity: No Food Insecurity (01/18/2024)   Epic    Worried About Programme Researcher, Broadcasting/film/video in the Last Year: Never true    Ran Out of Food in the Last Year: Never true  Transportation Needs: No Transportation Needs (01/18/2024)   Epic    Lack of Transportation (Medical): No    Lack of Transportation (Non-Medical): No  Physical Activity: Not on file  Stress: Not on file  Social Connections: Moderately Integrated (01/18/2024)   Social Connection and Isolation Panel    Frequency of Communication with Friends and Family: More than three times a week    Frequency of Social Gatherings with Friends and Family: More than three times a week    Attends Religious Services: More than 4 times per year    Active Member of [employer] or  Organizations: Yes    Attends Banker Meetings: 1 to 4 times per year    Marital Status: Widowed  Intimate Partner Violence: Not At Risk (01/18/2024)   Epic    Fear of Current or Ex-Partner:  No    Emotionally Abused: No    Physically Abused: No    Sexually Abused: No  Depression (PHQ2-9): Not on file  Alcohol Screen: Not on file  Housing: Low Risk (01/18/2024)   Epic    Unable to Pay for Housing in the Last Year: No    Number of Times Moved in the Last Year: 0    Homeless in the Last Year: No  Utilities: Not At Risk (01/18/2024)   Epic    Threatened with loss of utilities: No  Health Literacy: Not on file    Family History:    Family History  Problem Relation Age of Onset   Dementia Father        lewy body dementia   Heart disease Other        PACEMAKER DEFIB.       ROS:  Please see the history of present illness.   All other ROS reviewed and negative.     Physical Exam/Data: Vitals:   01/18/24 1415 01/18/24 1430 01/18/24 1601 01/18/24 1814  BP: (!) 124/90   (!) 124/90  Pulse: (!) 116 (!) 113  (!) 121  Resp: (!) 25 (!) 29  (!) 26  Temp:   98.2 F (36.8 C) 98.2 F (36.8 C)  TempSrc:   Oral Oral  SpO2: 95% 97%  95%   No intake or output data in the 24 hours ending 01/18/24 1929    03/04/2023    1:15 PM 01/06/2023    1:10 PM 03/03/2022    3:24 PM  Last 3 Weights  Weight (lbs) 132 lb 9.6 oz 130 lb 14.4 oz 139 lb 8 oz  Weight (kg) 60.147 kg 59.376 kg 63.277 kg     There is no height or weight on file to calculate BMI.  physical exam per Dr. Jordan  EKG:  The EKG was personally reviewed and demonstrates:  atrial fibrillation with a rate of 114, a nonspecific IVCD, and LVH. Telemetry:  Telemetry was personally reviewed and demonstrates: Per Dr. Francyne  Relevant CV Studies: TTE  IMPRESSIONS     1. Left ventricular ejection fraction, by estimation, is 35 to 40%. The  left ventricle has moderately decreased function. The left ventricle   demonstrates global hypokinesis. There is mild concentric left ventricular  hypertrophy. Left ventricular  diastolic function could not be evaluated.   2. Right ventricular systolic function is moderately reduced. The right  ventricular size is normal. Tricuspid regurgitation signal is inadequate  for assessing PA pressure.   3. The mitral valve is normal in structure. Mild to moderate mitral valve  regurgitation. No evidence of mitral stenosis.   4. The aortic valve is normal in structure. Aortic valve regurgitation is  moderate. No aortic stenosis is present.   5. The inferior vena cava is normal in size with <50% respiratory  variability, suggesting right atrial pressure of 8 mmHg.   Laboratory Data: High Sensitivity Troponin:  No results for input(s): TROPONINIHS in the last 720 hours.  Recent Labs  Lab 01/18/24 1245  TRNPT 19      Chemistry Recent Labs  Lab 01/18/24 1245  NA 139  K 4.7  CL 104  CO2 26  GLUCOSE 112*  BUN 29*  CREATININE 1.01*  CALCIUM  9.1  MG 2.1  GFRNONAA 54*  ANIONGAP 10    Recent Labs  Lab 01/18/24 1245  PROT 6.1*  ALBUMIN 4.1  AST 37  ALT 28  ALKPHOS 70  BILITOT 0.7   Lipids  No results for input(s): CHOL, TRIG, HDL, LABVLDL, LDLCALC, CHOLHDL in the last 168 hours.  Hematology Recent Labs  Lab 01/18/24 1245  WBC 6.4  RBC 4.25  HGB 10.7*  HCT 35.3*  MCV 83.1  MCH 25.2*  MCHC 30.3  RDW 14.9  PLT 330   Thyroid  No results for input(s): TSH, FREET4 in the last 168 hours.  BNP Recent Labs  Lab 01/18/24 1245  PROBNP 1,834.0*    DDimer No results for input(s): DDIMER in the last 168 hours.  Radiology/Studies:  ECHOCARDIOGRAM COMPLETE Result Date: 01/18/2024    ECHOCARDIOGRAM REPORT   Patient Name:   Maureen [name] Date of Exam: 01/18/2024 Medical Rec #:  [id number]      Height:       62.0 in Accession #:    [id number]     Weight:       132.6 lb Date of Birth:  1940/04/16      BSA:          1.605 m Patient  Age:    83 years       BP:           124/90 mmHg Patient Gender: F              HR:           115 bpm. Exam Location:  Inpatient Procedure: 2D Echo, Cardiac Doppler, Color Doppler and Intracardiac            Opacification Agent (Both Spectral and Color Flow Doppler were            utilized during procedure). Indications:    CHF- Acute Diastolic I50.31  History:        Patient has prior history of Echocardiogram examinations, most                 recent 01/09/2021. Cardiomyopathy and CHF, Arrythmias:Atrial                 Fibrillation, Signs/Symptoms:Chest Pain and Shortness of Breath;                 Risk Factors:Hypertension and Sleep Apnea.  Sonographer:    Koleen Popper RDCS Referring Phys: [phone] RONDELL A SMITH IMPRESSIONS  1. Left ventricular ejection fraction, by estimation, is 35 to 40%. The left ventricle has moderately decreased function. The left ventricle demonstrates global hypokinesis. There is mild concentric left ventricular hypertrophy. Left ventricular diastolic function could not be evaluated.  2. Right ventricular systolic function is moderately reduced. The right ventricular size is normal. Tricuspid regurgitation signal is inadequate for assessing PA pressure.  3. The mitral valve is normal in structure. Mild to moderate mitral valve regurgitation. No evidence of mitral stenosis.  4. The aortic valve is normal in structure. Aortic valve regurgitation is moderate. No aortic stenosis is present.  5. The inferior vena cava is normal in size with <50% respiratory variability, suggesting right atrial pressure of 8 mmHg. FINDINGS  Left Ventricle: Left ventricular ejection fraction, by estimation, is 35 to 40%. The left ventricle has moderately decreased function. The left ventricle demonstrates global hypokinesis. Definity  contrast agent was given IV to delineate the left ventricular endocardial borders. The left ventricular internal cavity size was normal in size. There is mild concentric left  ventricular hypertrophy. Left ventricular diastolic function could not be evaluated due to atrial fibrillation. Left ventricular diastolic function could not be evaluated. Normal left ventricular filling pressure. Right Ventricle: The right ventricular size is normal. No  increase in right ventricular wall thickness. Right ventricular systolic function is moderately reduced. Tricuspid regurgitation signal is inadequate for assessing PA pressure. Left Atrium: Left atrial size was normal in size. Right Atrium: Right atrial size was normal in size. Pericardium: There is no evidence of pericardial effusion. Mitral Valve: The mitral valve is normal in structure. Mild to moderate mitral valve regurgitation. No evidence of mitral valve stenosis. Tricuspid Valve: The tricuspid valve is normal in structure. Tricuspid valve regurgitation is not demonstrated. No evidence of tricuspid stenosis. Aortic Valve: The aortic valve is normal in structure. Aortic valve regurgitation is moderate. No aortic stenosis is present. Pulmonic Valve: The pulmonic valve was normal in structure. Pulmonic valve regurgitation is mild. No evidence of pulmonic stenosis. Aorta: The aortic root is normal in size and structure. Venous: The inferior vena cava is normal in size with less than 50% respiratory variability, suggesting right atrial pressure of 8 mmHg. IAS/Shunts: No atrial level shunt detected by color flow Doppler.  LEFT VENTRICLE PLAX 2D LVIDd:         4.50 cm LVIDs:         3.80 cm LV PW:         1.10 cm LV IVS:        1.10 cm LVOT diam:     1.91 cm LV SV:         38 LV SV Index:   23 LVOT Area:     2.87 cm  LV Volumes (MOD) LV vol d, MOD A4C: 113.0 ml LV vol s, MOD A4C: 62.2 ml LV SV MOD A4C:     113.0 ml RIGHT VENTRICLE            IVC RV Basal diam:  3.46 cm    IVC diam: 2.03 cm RV S prime:     9.65 cm/s TAPSE (M-mode): 1.5 cm LEFT ATRIUM             Index        RIGHT ATRIUM           Index LA diam:        4.22 cm 2.63 cm/m   RA Area:      13.20 cm LA Vol (A2C):   45.1 ml 28.09 ml/m  RA Volume:   29.40 ml  18.31 ml/m LA Vol (A4C):   45.9 ml 28.59 ml/m LA Biplane Vol: 45.8 ml 28.53 ml/m  AORTIC VALVE LVOT Vmax:   88.60 cm/s LVOT Vmean:  59.700 cm/s LVOT VTI:    0.131 m  AORTA Ao Root diam: 3.20 cm Ao Asc diam:  3.74 cm MR Peak grad: 79.9 mmHg MR Vmax:      447.00 cm/s SHUNTS                           Systemic VTI:  0.13 m                           Systemic Diam: 1.91 cm Morene Brownie Electronically signed by Morene Brownie Signature Date/Time: 01/18/2024/5:42:06 PM    Final    DG Chest Portable 1 View Result Date: 01/18/2024 EXAM: 1 VIEW(S) XRAY OF THE CHEST 01/18/2024 12:27:15 PM COMPARISON: Chest x-ray 11/13/2017, CT abdomen and pelvis at 11:20:21. CLINICAL HISTORY: dyspnea, afib FINDINGS: LUNGS AND PLEURA: Perihilar and bibasilar interstitial opacities. Possible trace bilateral pleural effusions. No pneumothorax. HEART AND MEDIASTINUM: Mild cardiomegaly. No acute abnormality of the  mediastinal silhouette. BONES AND SOFT TISSUES: Multilevel thoracic osteophytosis. No acute osseous abnormality. IMPRESSION: 1. Perihilar and bibasilar interstitial opacities with possible trace bilateral pleural effusions. 2. Mild cardiomegaly. Electronically signed by: Morgane Naveau MD 01/18/2024 02:01 PM EST RP Workstation: [id number]     Assessment and Plan: Maureen [name] is a 83 y.o. female with a hx of paroxysmal atrial fibrillation on Eliquis , combined systolic and diastolic heart failure, hypertension, nonischemic cardiomyopathy, and hyperlipidemia who is being seen 01/18/2024 for the evaluation of atrial fibrillation at the request of Rondell Smith.  Paroxysmal atrial fibrillation Patient has a history of atrial fibrillation. EKG showed atrial fibrillation with a rate of 114, a nonspecific IVCD, and LVH.  Was initially placed on Cardizem  but this was held after the patient's LVEF was found to be reduced. Labs show potassium of 4.7 and  magnesium of 2.1. Ordered TSH Was started on bolus and IV amiodarone . Plan for a cardioversion tomorrow. Continue Eliquis  5 mg twice daily.  Combined systolic and diastolic heart failure TTE showed a reduced LVEF of 35 to 40%, global hypokinesis, mild concentric LVH, moderately reduced RV systolic function, mild to moderate MR, moderate AR, and IVC with less than 50% respiratory variability. In 2020 had A-fib with RVR and heart failure.  Cardiac cath at that time showed nonobstructive CAD. Labs showed elevated proBNP of 1834.  Creatinine was 1.01. Was started on Lasix  40 mg twice daily. Will prioritize rate control and diuresis of GDMT for now.  Plan to start GDMT prior to discharge.   Risk Assessment/Risk Scores:       New York  Heart Association (NYHA) Functional Class NYHA Class II  CHA2DS2-VASc Score = 8   This indicates a 10.8% annual risk of stroke. The patient's score is based upon: CHF History: 1 HTN History: 1 Diabetes History: 0 Stroke History: 2 Vascular Disease History: 1 Age Score: 2 Gender Score: 1        For questions or updates, please contact [facility] Please consult [email or web address] for contact info under      Signed, Maureen Clause, Maureen [name]  01/18/2024 7:29 PM  I have seen and examined the patient along with Zane Adams, PA.  I have reviewed the chart, notes and new data.  I agree with PA's note.  Key new complaints: She feels better since arriving at the hospital.  Is not aware of palpitations, but presented due to shortness of breath, symptoms very similar to her previous presentations with tachycardia cardiomyopathy and her lungs were full of fluid.  About 5 pounds in the last 4 days and she has mild orthopnea.  Has been short of breath since Christmas Day, roughly 4 days now.  Has also been having some vague chest discomfort and recurrent bouts with soft bowel movements. Key examination changes:  General: Alert, oriented x3, no  distress Head: no evidence of trauma, PERRL, EOMI, no exophtalmos or lid lag, no myxedema, no xanthelasma; normal ears, nose and oropharynx Neck: normal jugular venous pulsations and no hepatojugular reflux; brisk carotid pulses without delay and no carotid bruits Chest: clear to auscultation, no signs of consolidation by percussion or palpation, normal fremitus, symmetrical and full respiratory excursions Cardiovascular: normal position and quality of the apical impulse, rapid regular rhythm, normal first and second heart sounds, no murmurs, rubs or gallops Abdomen: no tenderness or distention, no masses by palpation, no abnormal pulsatility or arterial bruits, normal bowel sounds, no hepatosplenomegaly Extremities: no clubbing, cyanosis or edema; 2+ radial, ulnar and brachial pulses  bilaterally; 2+ right femoral, posterior tibial and dorsalis pedis pulses; 2+ left femoral, posterior tibial and dorsalis pedis pulses; no subclavian or femoral bruits Neurological: grossly nonfocal Psych: Normal mood and affect Key new findings / data: Senting ECG was interpreted as showing atrial fibrillation but may actually represent atypical atrial flutter with variable AV block.  Has a chronic nonspecific intraventricular conduction delay/atypical LBBB with a QRS duration of about 132 ms, without overt ischemic changes.  Telemetry now appears to show atrial flutter with 2: 1 AV block a regular rate at 124 bpm. The ECG from initial presentation on February 17, 2018 shows a very similar pattern of atypical atrial flutter with 2: 1 AV block. Another ECG dated December 10, 2019 appears more consistent with atrial fibrillation, although some distinct flutter waves are seen in the second half of the tracing. Cardiogram shows depressed left ventricular systolic function with EF 35-40% and global hypokinesis.  There is also moderate RV dysfunction.  There are no hemodynamically meaningful valve abnormalities.  The left and  right atria were described as normal in size, but the measurements actually suggest that she has a moderately dilated left atrium.  PLAN: Suspect she has recurrent tachycardia cardiomyopathy due to uncontrolled atypical atrial flutter with rapid ventricular rates.  It is likely that she has had the arrhythmia for weeks rather than a few days, since she is unaware of palpitations.  She will benefit from cardioversion.  She reports that she has not missed more than 1 dose of Eliquis  in the last 3 weeks so I do not think she will need a transesophageal echocardiogram.  Hopefully we can get on the schedule for tomorrow.  In the meantime we will load with intravenous amiodarone  for better rate control and to increase the odds of maintenance of normal rhythm after the cardioversion.  In the long run I think she would benefit from ablation, to avoid possible toxicity from lifelong amiodarone  therapy..  Informed Consent   Shared Decision Making/Informed Consent The risks (stroke, cardiac arrhythmias rarely resulting in the need for a temporary or permanent pacemaker, skin irritation or burns and complications associated with conscious sedation including aspiration, arrhythmia, respiratory failure and death), benefits (restoration of normal sinus rhythm) and alternatives of a direct current cardioversion were explained in detail to Ms. Hou and she agrees to proceed.        Jerel Balding, MD, [facility] [phone] 01/18/2024, 8:54 PM       [1]  Allergies Allergen Reactions   Dilantin [Phenytoin Sodium Extended] Other (See Comments)     Stupor, ataxia.    Hydrocodone Nausea And Vomiting   "

## 2024-01-18 NOTE — ED Triage Notes (Signed)
 Patient BIB EMS from Urgent Care where she presented with SOB with exertion, decreased appetite, body aches, urinary urgency for 2 days, and diarrhea for 3 days. Patient was found to be in AFIB/AFLUTTER which she does have a history of, but it has been several years.

## 2024-01-18 NOTE — H&P (Addendum)
 " History and Physical    Patient: Maureen [name] FMW:[id number] DOB: 02-Jul-1940 DOA: 01/18/2024 DOS: the patient was seen and examined on 01/18/2024 PCP: Loreli Kins, MD  Patient coming from: Home  Chief Complaint:  Chief Complaint  Patient presents with   Atrial Fibrillation   Shortness of Breath   HPI: Maureen [name] is a 83 y.o. female with medical history significant of hypertension, hyperlipidemia, diastolic CHF, paroxysmal atrial fibrillation(s/p  cardioversion in 02/2018, meningioma, and seizure disorder presents with heart palpitations and shortness of breath.  She has been experiencing heart palpitations and difficulty breathing, describing a sensation as though her lungs were 'full of fluid.' These symptoms began four days ago. She also notes mild and intermittent chest discomfort, particularly when she cannot breathe properly.  She has gained about five pounds over the Christmas holiday and has been having trouble sleeping flat at night, requiring an extra pillow for comfort. She wakes frequently to urinate, which she attributes to being given more fluids. No leg swelling, calf pain, fever, chills, or cough.  Her current medications include Eliquis , which she has been taking regularly, although she expressed uncertainty about taking it this morning. She recalls undergoing cardioversion in the past to address similar irregular heart rhythms, possibly in the spring of last year.  She does not use a CPAP or BiPAP machine and denies smoking or alcohol use.  In the ED patient was noted to be afebrile with pulse 119, blood pressures 134/100, and O2 saturation maintained on room air.  Labs noted hemoglobin 10.7, BUN 29, creatinine 1.01.  Chest x-ray noted perihilar and bibasilar interstitial opacities with possible trace bilateral pleural effusions and mild cardiomegaly.  Patient had initially been given 500 mL bolus of IV fluids, Cardizem  10 mg IV x 2 doses, and then placed on a  drip.  Review of Systems: As mentioned in the history of present illness. All other systems reviewed and are negative. Past Medical History:  Diagnosis Date   Atrial fibrillation (HCC)    Benign tumor of meninges (HCC)    Chronic combined systolic and diastolic CHF, NYHA class 2 and ACA/AHA stage C 01/2018   Exacerbated by A. fib; EF 35 to 40% with global HK.  Reduced cardiac output and index (3.2/1.9).   -Associated moderate pulmonary hypertension-(mean PA P 38 mmHg)   GERD (gastroesophageal reflux disease)    occ. in past   Headache(784.0)    HTN (hypertension), benign    pt. states no hypertension   NICM (nonischemic cardiomyopathy) (HCC) 01/2018   Echo with EF of 35 to 40%.  Global HK.  Mild to moderate MR.  Mild nonobstructive CAD by cath.  Cardiac output/index 3.2/1.9   Pneumonia    ? as child   PONV (postoperative nausea and vomiting)    Seizures (HCC)    positive for HBP   Past Surgical History:  Procedure Laterality Date   CARDIOVERSION N/A 02/24/2018   Procedure: CARDIOVERSION;  Surgeon: Shlomo Wilbert SAUNDERS, MD;  Location: [facility] ENDOSCOPY;  Service: Cardiovascular;  Laterality: N/A;   CATARACT EXTRACTION, BILATERAL Bilateral    CHOLECYSTECTOMY     COLONOSCOPY WITH PROPOFOL  N/A 11/30/2012   Procedure: COLONOSCOPY WITH PROPOFOL ;  Surgeon: Gladis MARLA Louder, MD;  Location: [facility] ENDOSCOPY;  Service: Endoscopy;  Laterality: N/A;   ESOPHAGOGASTRODUODENOSCOPY (EGD) WITH PROPOFOL  N/A 11/30/2012   Procedure: ESOPHAGOGASTRODUODENOSCOPY (EGD) WITH PROPOFOL ;  Surgeon: Gladis MARLA Louder, MD;  Location: [facility] ENDOSCOPY;  Service: Endoscopy;  Laterality: N/A;   ESOPHAGOGASTRODUODENOSCOPY (EGD) WITH PROPOFOL   N/A 03/14/2015   Procedure: ESOPHAGOGASTRODUODENOSCOPY (EGD) WITH PROPOFOL ;  Surgeon: Elsie Cree, MD;  Location: [facility] ENDOSCOPY;  Service: Endoscopy;  Laterality: N/A;   EUS N/A 01/05/2013   Procedure: FULL UPPER ENDOSCOPIC ULTRASOUND (EUS) RADIAL;  Surgeon: Elsie Cree, MD;  Location: [facility]  ENDOSCOPY;  Service: Endoscopy;  Laterality: N/A;  EUS with possible FNA   EUS N/A 03/14/2015   Procedure: UPPER ENDOSCOPIC ULTRASOUND (EUS) RADIAL;  Surgeon: Elsie Cree, MD;  Location: [facility] ENDOSCOPY;  Service: Endoscopy;  Laterality: N/A;   EYE SURGERY Left    few weeks ago scar tissue laser surgery   FINE NEEDLE ASPIRATION N/A 01/05/2013   Procedure: FINE NEEDLE ASPIRATION (FNA) RADIAL;  Surgeon: Elsie Cree, MD;  Location: [facility] ENDOSCOPY;  Service: Endoscopy;  Laterality: N/A;   KNEE ARTHROSCOPY Left    MYELOMININGOCELE REPAIR  12/1990   RIGHT/LEFT HEART CATH AND CORONARY ANGIOGRAPHY N/A 02/22/2018   Procedure: RIGHT/LEFT HEART CATH AND CORONARY ANGIOGRAPHY;  Surgeon: Rolan Ezra RAMAN, MD;  Location: [facility] INVASIVE CV LAB;;  Nonobstructive CAD.  Mild to moderate pulmonary hypertension (PA P 47/24 mmHg-mean 38 mmHg.  PCWP 22 mmHg with LVEDP 24 mmHg. CO/CI = 3.2/1.9   TEE WITHOUT CARDIOVERSION N/A 02/24/2018   Procedure: TRANSESOPHAGEAL ECHOCARDIOGRAM (TEE) - WITH DCCV;  Surgeon: Shlomo Wilbert SAUNDERS, MD;  Location: [facility] ENDOSCOPY;; EF 35-40%.  Global HK.  No R WMA. Mild AI, Mild-Mod MR. Severe LA dilation (no LAA thrombus).  Severely reduced RV function.  Mild RA dilation --successful DCCV   TRANSTHORACIC ECHOCARDIOGRAM  02/18/2018   EF 35 to 40%.  Unable to assess diastolic function because of A. fib.  No obvious regional wall motion abnormality.  Global hypokinesis.  Severe LA dilation.  Mild-moderate MR.  Mild AI without AS   TRANSTHORACIC ECHOCARDIOGRAM  11/2019   CHF with A. fib RVR:  EF 45 to 50%.  Mildly reduced function.  GR 3 DD with elevated LAP.  Estimated RVP/PAP 72 to 73 mmHg.  Moderate MR and TR.  Mild to moderate PR.  Suggested CVP 15 mmHg.  Interestingly, despite the severe elevated pressures.  Both right and left atria were both normal size.   VAGINAL HYSTERECTOMY  1983   Social History:  reports that she has never smoked. She has never used smokeless tobacco. She reports that she does  not currently use alcohol. She reports that she does not use drugs.  Allergies[1]  Family History  Problem Relation Age of Onset   Dementia Father        lewy body dementia   Heart disease Other        PACEMAKER DEFIB.      Prior to Admission medications  Medication Sig Start Date End Date Taking? Authorizing Provider  amiodarone  (PACERONE ) 200 MG tablet Take 0.5 tablets (100 mg total) by mouth daily. 04/06/23   Anner Alm ORN, MD  apixaban  (ELIQUIS ) 5 MG TABS tablet Take 1 tablet by mouth twice daily. 09/04/23   Anner Alm ORN, MD  aspirin  EC 81 MG tablet Take 81 mg by mouth as needed. 09/17/17   [provider]  Calcium  Carb-Cholecalciferol  (OYSTER SHELL CALCIUM  W/D) 500-5 MG-MCG TABS Take 1 tablet by mouth daily. 09/17/17   [provider]  Calcium -Magnesium-Zinc (CAL-MAG-ZINC PO) Take 1 tablet by mouth daily at 12 noon.    [provider]  Cholecalciferol  (VITAMIN D -3) 25 MCG (1000 UT) CAPS Take 1,000 Units by mouth daily. Take 1 capsule daily    [provider]  furosemide  (LASIX ) 40  MG tablet 1 tablet Orally once a day if needed 12/12/19   [provider]  levETIRAcetam  (KEPPRA ) 500 MG tablet Take 1 tablet by mouth twice daily. 09/07/23   Dohmeier, Dedra, MD  losartan  (COZAAR ) 50 MG tablet Take 1 tablet (50 mg total) by mouth daily. 03/09/23   Anner Alm ORN, MD  metoprolol  succinate (TOPROL -XL) 25 MG 24 hr tablet Take 1 tablet (25 mg total) by mouth daily. Stop taking after 2 weeks starting 0n 02/18/22) 02/17/22   Anner Alm ORN, MD  Multiple Vitamin (MULTIVITAMIN WITH MINERALS) TABS tablet Take 1 tablet by mouth daily.    [provider]  pravastatin  (PRAVACHOL ) 40 MG tablet Take 40 mg by mouth daily.  05/05/13   [provider]    Physical Exam: Vitals:   01/18/24 1135 01/18/24 1137 01/18/24 1157  BP:   (!) 134/100  Pulse:   (!) 119  Resp:   15  Temp:   98.2 F (36.8 C)  TempSrc:   Oral  SpO2: 94% 94% 98%    Constitutional: Elderly female NAD, calm, comfortable Eyes: PERRL, lids and conjunctivae normal ENMT: Mucous membranes are moist. Posterior pharynx clear of any exudate or lesions.Normal dentition.  Neck: normal, supple  Respiratory: clear to auscultation bilaterally, no wheezing, no crackles. Normal respiratory effort. No accessory muscle use.  Cardiovascular: Irregular irregular   Abdomen: no tenderness, no masses palpated.  .  Musculoskeletal: no clubbing / cyanosis. No joint deformity upper and lower extremities.   Normal muscle tone.  Skin: no rashes, lesions, ulcers.   Neurologic: CN 2-12 grossly intact.   Strength 5/5 in all 4.  Psychiatric: Normal judgment and insight. Alert and oriented x 3. Normal mood.   Data Reviewed:  EKG revealed atrial fibrillation at 114 bpm.  Assessment and Plan:  Paroxysmal atrial fibrillation Patient presented with complaints of palpitations chest discomfort.  Patient was found to be in A-fib with heart rates in the 110s.  Previous history of requiring cardioversion. - Admit to a telemetry bed - Goal potassium at least 4 magnesium at least 2.  Replace electrolytes to goal - Check TSH - Discontinue Cardizem  drip and switching to amiodarone  drip - Continue Eliquis  - Cardiology consulted,  will follow-up for any further recommendations  Heart failure with reduced ejection fraction Acute.  Patient reported 5 pound weight gain over the holidays with reports of orthopnea last night.  proBNP elevated at 1834 and high-sensitivity troponin within normal limits.  Last echocardiogram in 12/2020 noted EF to be 60 to 65% with grade 2 diastolic dysfunction at that time.  Patient had been initially given 500 mL of IV fluids by the ED provider, but upon reevaluation felt to possibly be fluid overloaded for which she was given Lasix  40 mg IV. - Strict I&Os - Check echocardiogram(EF noted to be acutely lower at 35 to 40% with indeterminate diastolic parameters) -  Continue Lasix  IV in a.m.  Normocytic anemia Hemoglobin noted to be 10.7.  Baseline hemoglobin noted to be within normal limits previously. - Continue to monitor  Chronic kidney disease stage IIIa Creatinine noted to be 1.01. - Continue to monitor kidney function  Seizure disorder - Continue Keppra   Hyperlipidemia - Continue pravastatin   DVT prophylaxis: Eliquis  Advance Care Planning:   Code Status: Full Code    Consults: Cardiology  Family Communication: none  Severity of Illness: The appropriate patient status for this patient is OBSERVATION. Observation status is judged to be reasonable and necessary in order to provide the  required intensity of service to ensure the patient's safety. The patient's presenting symptoms, physical exam findings, and initial radiographic and laboratory data in the context of their medical condition is felt to place them at decreased risk for further clinical deterioration. Furthermore, it is anticipated that the patient will be medically stable for discharge from the hospital within 2 midnights of admission.   Author: Maximino DELENA Sharps, MD 01/18/2024 2:41 PM  For on call review [email or web address].      [1]  Allergies Allergen Reactions   Dilantin [Phenytoin Sodium Extended] Other (See Comments)     Stupor, ataxia.    Hydrocodone Nausea And Vomiting   "

## 2024-01-18 NOTE — ED Provider Notes (Signed)
 " [facility] Provider Note   CSN: [id number] Arrival date & time: 01/18/24  1109     Patient presents with: Atrial Fibrillation and Shortness of Breath   Maureen [name] is a 83 y.o. female.   HPI 83 year old female presents with shortness of breath and A-fib.  Patient has a history of A-fib and typically when exacerbated will cause shortness of breath similar to what she is experiencing.  The symptoms originally started on 12/25.  She has not had a cough.  She has been having intermittent shortness of breath and chest pain/pressure.  No leg swelling.  Has had about 3 or 4 episodes of diarrhea per day since this started as well.  No blood in the stool.  No vomiting or abdominal pain.  Prior to Admission medications  Medication Sig Start Date End Date Taking? Authorizing Provider  amiodarone  (PACERONE ) 200 MG tablet Take 0.5 tablets (100 mg total) by mouth daily. 04/06/23   Anner Alm ORN, MD  apixaban  (ELIQUIS ) 5 MG TABS tablet Take 1 tablet by mouth twice daily. 09/04/23   Anner Alm ORN, MD  aspirin  EC 81 MG tablet Take 81 mg by mouth as needed. 09/17/17   [provider]  Calcium  Carb-Cholecalciferol  (OYSTER SHELL CALCIUM  W/D) 500-5 MG-MCG TABS Take 1 tablet by mouth daily. 09/17/17   [provider]  Calcium -Magnesium-Zinc (CAL-MAG-ZINC PO) Take 1 tablet by mouth daily at 12 noon.    [provider]  Cholecalciferol  (VITAMIN D -3) 25 MCG (1000 UT) CAPS Take 1,000 Units by mouth daily. Take 1 capsule daily    [provider]  furosemide  (LASIX ) 40 MG tablet 1 tablet Orally once a day if needed 12/12/19   [provider]  levETIRAcetam  (KEPPRA ) 500 MG tablet Take 1 tablet by mouth twice daily. 09/07/23   Dohmeier, Dedra, MD  losartan  (COZAAR ) 50 MG tablet Take 1 tablet (50 mg total) by mouth daily. 03/09/23   Anner Alm ORN, MD  metoprolol  succinate (TOPROL -XL) 25 MG 24 hr tablet Take 1 tablet (25  mg total) by mouth daily. Stop taking after 2 weeks starting 0n 02/18/22) 02/17/22   Anner Alm ORN, MD  Multiple Vitamin (MULTIVITAMIN WITH MINERALS) TABS tablet Take 1 tablet by mouth daily.    [provider]  pravastatin  (PRAVACHOL ) 40 MG tablet Take 40 mg by mouth daily.  05/05/13   [provider]    Allergies: Dilantin [phenytoin sodium extended] and Hydrocodone    Review of Systems  Constitutional:  Negative for fever.  Respiratory:  Positive for shortness of breath.   Cardiovascular:  Positive for chest pain and palpitations. Negative for leg swelling.  Gastrointestinal:  Positive for diarrhea. Negative for abdominal pain, blood in stool and vomiting.    Updated Vital Signs BP (!) 124/90   Pulse (!) 113   Temp 98.2 F (36.8 C) (Oral)   Resp (!) 29   SpO2 97%   Physical Exam Vitals and nursing note reviewed.  Constitutional:      General: She is not in acute distress.    Appearance: She is well-developed. She is not ill-appearing or diaphoretic.  HENT:     Head: Normocephalic and atraumatic.  Cardiovascular:     Rate and Rhythm: Tachycardia present. Rhythm irregular.     Heart sounds: Normal heart sounds.  Pulmonary:     Effort: Pulmonary effort is normal.     Breath sounds: Normal breath sounds. No wheezing or rales.  Abdominal:  Palpations: Abdomen is soft.     Tenderness: There is no abdominal tenderness.  Musculoskeletal:     Right lower leg: No edema.     Left lower leg: No edema.  Skin:    General: Skin is warm and dry.  Neurological:     Mental Status: She is alert.     (all labs ordered are listed, but only abnormal results are displayed) Labs Reviewed  CBC WITH DIFFERENTIAL/PLATELET - Abnormal; Notable for the following components:      Result Value   Hemoglobin 10.7 (*)    HCT 35.3 (*)    MCH 25.2 (*)    All other components within normal limits  COMPREHENSIVE METABOLIC PANEL WITH GFR - Abnormal; Notable for the following  components:   Glucose, Bld 112 (*)    BUN 29 (*)    Creatinine, Ser 1.01 (*)    Total Protein 6.1 (*)    GFR, Estimated 54 (*)    All other components within normal limits  PRO BRAIN NATRIURETIC PEPTIDE - Abnormal; Notable for the following components:   Pro Brain Natriuretic Peptide 1,834.0 (*)    All other components within normal limits  RESPIRATORY PANEL BY PCR  MAGNESIUM  TROPONIN T, HIGH SENSITIVITY  TROPONIN T, HIGH SENSITIVITY    EKG: EKG Interpretation Date/Time:  Monday January 18 2024 11:56:30 EST Ventricular Rate:  114 PR Interval:    QRS Duration:  132 QT Interval:  345 QTC Calculation: 476 R Axis:   -66  Text Interpretation: Atrial fibrillation Nonspecific IVCD with LAD LVH with secondary repolarization abnormality Anterior infarct, old Confirmed by Freddi Hamilton [phone]) on 01/18/2024 12:00:07 PM  Radiology: DG Chest Portable 1 View Result Date: 01/18/2024 EXAM: 1 VIEW(S) XRAY OF THE CHEST 01/18/2024 12:27:15 PM COMPARISON: Chest x-ray 11/13/2017, CT abdomen and pelvis at 11:20:21. CLINICAL HISTORY: dyspnea, afib FINDINGS: LUNGS AND PLEURA: Perihilar and bibasilar interstitial opacities. Possible trace bilateral pleural effusions. No pneumothorax. HEART AND MEDIASTINUM: Mild cardiomegaly. No acute abnormality of the mediastinal silhouette. BONES AND SOFT TISSUES: Multilevel thoracic osteophytosis. No acute osseous abnormality. IMPRESSION: 1. Perihilar and bibasilar interstitial opacities with possible trace bilateral pleural effusions. 2. Mild cardiomegaly. Electronically signed by: Morgane Naveau MD 01/18/2024 02:01 PM EST RP Workstation: [id number]     .Critical Care  Performed by: Freddi Hamilton, MD Authorized by: Freddi Hamilton, MD   Critical care provider statement:    Critical care time (minutes):  35   Critical care time was exclusive of:  Separately billable procedures and treating other patients   Critical care was necessary to treat or prevent  imminent or life-threatening deterioration of the following conditions:  Respiratory failure and cardiac failure   Critical care was time spent personally by me on the following activities:  Development of treatment plan with patient or surrogate, discussions with consultants, evaluation of patient's response to treatment, examination of patient, ordering and review of laboratory studies, ordering and review of radiographic studies, ordering and performing treatments and interventions, pulse oximetry, re-evaluation of patient's condition and review of old charts    Medications Ordered in the ED  diltiazem  (CARDIZEM ) 1 mg/mL load via infusion 10 mg (10 mg Intravenous Bolus from Bag 01/18/24 1342)    And  diltiazem  (CARDIZEM ) 125 mg in dextrose  5% 125 mL (1 mg/mL) infusion (5 mg/hr Intravenous New Bag/Given 01/18/24 1342)  furosemide  (LASIX ) injection 40 mg (has no administration in time range)  lactated ringers  bolus 500 mL (0 mLs Intravenous Stopped 01/18/24 1335)  diltiazem  (CARDIZEM )  injection 10 mg (10 mg Intravenous Given 01/18/24 1220)                                    Medical Decision Making Amount and/or Complexity of Data Reviewed Labs: ordered.    Details: Mild anemia, elevated BNP Radiology: ordered and independent interpretation performed.    Details: Probable mild CHF ECG/medicine tests: ordered and independent interpretation performed.    Details: A-fib  Risk Prescription drug management. Decision regarding hospitalization.   Patient presents with A-fib with RVR.  She is in no distress.  Was given diltiazem  bolus and drip.  She was given some small fluid bolus because of her concern for dehydration with the diarrhea.  However with her BNP and chest x-ray findings I am worried she is actually probably more in CHF.  I do not think these infiltrates represent pneumonia given no cough, fever, normal WBC.  Will give some Lasix .  She will need admission due to requiring a drip  for her A-fib rate control.  Discussed with Dr. Claudene for admission.     Final diagnoses:  Atrial fibrillation with RVR [facility])    ED Discharge Orders     None          Freddi Hamilton, MD 01/18/24 1452  "

## 2024-01-18 NOTE — Consult Note (Signed)
 "  Cardiology Consultation   Patient ID: Maureen [name] MRN: [id number]; DOB: Aug 06, 1940  Admit date: 01/18/2024 Date of Consult: 01/18/2024  PCP:  Loreli Kins, MD   [facility] Providers Cardiologist:  Alm Clay, MD   {   Patient Profile: Maureen [name] is a 83 y.o. female with a hx of seizure disorder, paroxysmal atrial fibrillation on Eliquis , combined systolic and diastolic heart failure, hypertension, nonischemic cardiomyopathy, and hyperlipidemia who is being seen 01/18/2024 for the evaluation of atrial fibrillation at the request of Rondell Smith.  History of Present Illness: Maureen [name] is an 83 year old female with prior cardiac history listed below  On 01/2018 the patient was hospitalized for A-fib with RVR.  Echo at about this time showed a reduced LVEF of 35 to 40%.  Patient had a left and right heart cath on 02/2018 that showed mild to moderate pulmonary hypertension, nonobstructive CAD and low cardiac output.  She underwent a TEE cardioversion on 02/2018.  Patient was seen in the hospital again for A-fib with RVR on 11/2019.  A TTE was done and her LVEF was moderately reduced at 45 to 50%.  TTE on 12/2020 showed a LVEF of 60 to 65%, no RWMA G2 DD, normal RV systolic function, and mild aortic valve regurgitation.  Patient presented to the hospital for atrial fibrillation and dyspnea on exertion.  She was found to be in atrial fibrillation with RVR.  Intravenous diltiazem  did not provide sufficient rate control and she was started on  IV amiodarone .  Labs showed a potassium of 4.7, creatinine of 1.01, magnesium of 2.1, elevated proBNP of 1834, troponin T of 19, hemoglobin of 10.7,and  WBC count of 6.4.  20 pathogen respiratory panel negative.  Chest x-ray showed Perihilar and bibasilar interstitial opacities with possible trace bilateral pleural effusions.  EKG showed atrial fibrillation with a rate of 114, a nonspecific IVCD, and LVH.  TTE showed a  reduced LVEF of 35 to 40%, global hypokinesis, mild concentric LVH, moderately reduced RV systolic function, mild to moderate MR, moderate AR, and IVC with less than 50% respiratory variability.  Past Medical History:  Diagnosis Date   Atrial fibrillation (HCC)    Benign tumor of meninges (HCC)    Chronic combined systolic and diastolic CHF, NYHA class 2 and ACA/AHA stage C 01/2018   Exacerbated by A. fib; EF 35 to 40% with global HK.  Reduced cardiac output and index (3.2/1.9).   -Associated moderate pulmonary hypertension-(mean PA P 38 mmHg)   GERD (gastroesophageal reflux disease)    occ. in past   Headache(784.0)    HTN (hypertension), benign    pt. states no hypertension   NICM (nonischemic cardiomyopathy) (HCC) 01/2018   Echo with EF of 35 to 40%.  Global HK.  Mild to moderate MR.  Mild nonobstructive CAD by cath.  Cardiac output/index 3.2/1.9   Pneumonia    ? as child   PONV (postoperative nausea and vomiting)    Seizures (HCC)    positive for HBP    Past Surgical History:  Procedure Laterality Date   CARDIOVERSION N/A 02/24/2018   Procedure: CARDIOVERSION;  Surgeon: Shlomo Wilbert SAUNDERS, MD;  Location: [facility] ENDOSCOPY;  Service: Cardiovascular;  Laterality: N/A;   CATARACT EXTRACTION, BILATERAL Bilateral    CHOLECYSTECTOMY     COLONOSCOPY WITH PROPOFOL  N/A 11/30/2012   Procedure: COLONOSCOPY WITH PROPOFOL ;  Surgeon: Gladis MARLA Louder, MD;  Location: [facility] ENDOSCOPY;  Service: Endoscopy;  Laterality: N/A;   ESOPHAGOGASTRODUODENOSCOPY (EGD) WITH PROPOFOL   N/A 11/30/2012   Procedure: ESOPHAGOGASTRODUODENOSCOPY (EGD) WITH PROPOFOL ;  Surgeon: Gladis MARLA Louder, MD;  Location: [facility] ENDOSCOPY;  Service: Endoscopy;  Laterality: N/A;   ESOPHAGOGASTRODUODENOSCOPY (EGD) WITH PROPOFOL  N/A 03/14/2015   Procedure: ESOPHAGOGASTRODUODENOSCOPY (EGD) WITH PROPOFOL ;  Surgeon: Elsie Cree, MD;  Location: [facility] ENDOSCOPY;  Service: Endoscopy;  Laterality: N/A;   EUS N/A 01/05/2013   Procedure: FULL UPPER  ENDOSCOPIC ULTRASOUND (EUS) RADIAL;  Surgeon: Elsie Cree, MD;  Location: [facility] ENDOSCOPY;  Service: Endoscopy;  Laterality: N/A;  EUS with possible FNA   EUS N/A 03/14/2015   Procedure: UPPER ENDOSCOPIC ULTRASOUND (EUS) RADIAL;  Surgeon: Elsie Cree, MD;  Location: [facility] ENDOSCOPY;  Service: Endoscopy;  Laterality: N/A;   EYE SURGERY Left    few weeks ago scar tissue laser surgery   FINE NEEDLE ASPIRATION N/A 01/05/2013   Procedure: FINE NEEDLE ASPIRATION (FNA) RADIAL;  Surgeon: Elsie Cree, MD;  Location: [facility] ENDOSCOPY;  Service: Endoscopy;  Laterality: N/A;   KNEE ARTHROSCOPY Left    MYELOMININGOCELE REPAIR  12/1990   RIGHT/LEFT HEART CATH AND CORONARY ANGIOGRAPHY N/A 02/22/2018   Procedure: RIGHT/LEFT HEART CATH AND CORONARY ANGIOGRAPHY;  Surgeon: Rolan Ezra RAMAN, MD;  Location: [facility] INVASIVE CV LAB;;  Nonobstructive CAD.  Mild to moderate pulmonary hypertension (PA P 47/24 mmHg-mean 38 mmHg.  PCWP 22 mmHg with LVEDP 24 mmHg. CO/CI = 3.2/1.9   TEE WITHOUT CARDIOVERSION N/A 02/24/2018   Procedure: TRANSESOPHAGEAL ECHOCARDIOGRAM (TEE) - WITH DCCV;  Surgeon: Shlomo Wilbert SAUNDERS, MD;  Location: [facility] ENDOSCOPY;; EF 35-40%.  Global HK.  No R WMA. Mild AI, Mild-Mod MR. Severe LA dilation (no LAA thrombus).  Severely reduced RV function.  Mild RA dilation --successful DCCV   TRANSTHORACIC ECHOCARDIOGRAM  02/18/2018   EF 35 to 40%.  Unable to assess diastolic function because of A. fib.  No obvious regional wall motion abnormality.  Global hypokinesis.  Severe LA dilation.  Mild-moderate MR.  Mild AI without AS   TRANSTHORACIC ECHOCARDIOGRAM  11/2019   CHF with A. fib RVR:  EF 45 to 50%.  Mildly reduced function.  GR 3 DD with elevated LAP.  Estimated RVP/PAP 72 to 73 mmHg.  Moderate MR and TR.  Mild to moderate PR.  Suggested CVP 15 mmHg.  Interestingly, despite the severe elevated pressures.  Both right and left atria were both normal size.   VAGINAL HYSTERECTOMY  1983     Home Medications:  Prior to  Admission medications  Medication Sig Start Date End Date Taking? Authorizing Provider  amiodarone  (PACERONE ) 200 MG tablet Take 0.5 tablets (100 mg total) by mouth daily. 04/06/23   Anner Alm ORN, MD  apixaban  (ELIQUIS ) 5 MG TABS tablet Take 1 tablet by mouth twice daily. 09/04/23   Anner Alm ORN, MD  aspirin  EC 81 MG tablet Take 81 mg by mouth as needed. 09/17/17   [provider]  Calcium  Carb-Cholecalciferol  (OYSTER SHELL CALCIUM  W/D) 500-5 MG-MCG TABS Take 1 tablet by mouth daily. 09/17/17   [provider]  Calcium -Magnesium-Zinc (CAL-MAG-ZINC PO) Take 1 tablet by mouth daily at 12 noon.    [provider]  Cholecalciferol  (VITAMIN D -3) 25 MCG (1000 UT) CAPS Take 1,000 Units by mouth daily. Take 1 capsule daily    [provider]  furosemide  (LASIX ) 40 MG tablet 1 tablet Orally once a day if needed 12/12/19   [provider]  levETIRAcetam  (KEPPRA ) 500 MG tablet Take 1 tablet by mouth twice daily. 09/07/23   Dohmeier, Dedra, MD  losartan  (COZAAR ) 50 MG  tablet Take 1 tablet (50 mg total) by mouth daily. 03/09/23   Anner Alm ORN, MD  metoprolol  succinate (TOPROL -XL) 25 MG 24 hr tablet Take 1 tablet (25 mg total) by mouth daily. Stop taking after 2 weeks starting 0n 02/18/22) 02/17/22   Anner Alm ORN, MD  Multiple Vitamin (MULTIVITAMIN WITH MINERALS) TABS tablet Take 1 tablet by mouth daily.    [provider]  pravastatin  (PRAVACHOL ) 40 MG tablet Take 40 mg by mouth daily.  05/05/13   [provider]    Scheduled Meds:  amiodarone   150 mg Intravenous Once   apixaban   5 mg Oral BID   [START ON 01/19/2024] furosemide   40 mg Intravenous BID   levETIRAcetam   500 mg Oral BID   pravastatin   40 mg Oral Daily   sodium chloride  flush  3 mL Intravenous Q12H   Continuous Infusions:  amiodarone      Followed by   NOREEN ON 01/19/2024] amiodarone      PRN Meds: acetaminophen  **OR** acetaminophen , albuterol , ondansetron  **OR**  ondansetron  (ZOFRAN ) IV  Allergies:   Allergies[1]  Social History:   Social History   Socioeconomic History   Marital status: Married    Spouse name: Jan   Number of children: 2   Years of education: Master's   Highest education level: Not on file  Occupational History   Occupation: retired    Comment: [employer] in 1995    Employer: RETIRED  Tobacco Use   Smoking status: Never   Smokeless tobacco: Never  Vaping Use   Vaping status: Never Used  Substance and Sexual Activity   Alcohol use: Not Currently    Comment: once monthly   Drug use: No   Sexual activity: Not on file  Other Topics Concern   Not on file  Social History Narrative   She retired in 1995 from [employer]. Lives at home with her husband, Jan. They have a son age 50 and a  son age 72. Cafffeine once daily. a week and alcohol once a month. Denies tobacco and illicit drug use.    Patient is left handed.   Patient has a Master's degree.   Social Drivers of Health   Tobacco Use: Low Risk (01/18/2024)   Patient History    Smoking Tobacco Use: Never    Smokeless Tobacco Use: Never    Passive Exposure: Not on file  Financial Resource Strain: Not on file  Food Insecurity: No Food Insecurity (01/18/2024)   Epic    Worried About Programme Researcher, Broadcasting/film/video in the Last Year: Never true    Ran Out of Food in the Last Year: Never true  Transportation Needs: No Transportation Needs (01/18/2024)   Epic    Lack of Transportation (Medical): No    Lack of Transportation (Non-Medical): No  Physical Activity: Not on file  Stress: Not on file  Social Connections: Moderately Integrated (01/18/2024)   Social Connection and Isolation Panel    Frequency of Communication with Friends and Family: More than three times a week    Frequency of Social Gatherings with Friends and Family: More than three times a week    Attends Religious Services: More than 4 times per year    Active Member of [employer] or  Organizations: Yes    Attends Banker Meetings: 1 to 4 times per year    Marital Status: Widowed  Intimate Partner Violence: Not At Risk (01/18/2024)   Epic    Fear of Current or Ex-Partner:  No    Emotionally Abused: No    Physically Abused: No    Sexually Abused: No  Depression (PHQ2-9): Not on file  Alcohol Screen: Not on file  Housing: Low Risk (01/18/2024)   Epic    Unable to Pay for Housing in the Last Year: No    Number of Times Moved in the Last Year: 0    Homeless in the Last Year: No  Utilities: Not At Risk (01/18/2024)   Epic    Threatened with loss of utilities: No  Health Literacy: Not on file    Family History:    Family History  Problem Relation Age of Onset   Dementia Father        lewy body dementia   Heart disease Other        PACEMAKER DEFIB.       ROS:  Please see the history of present illness.   All other ROS reviewed and negative.     Physical Exam/Data: Vitals:   01/18/24 1415 01/18/24 1430 01/18/24 1601 01/18/24 1814  BP: (!) 124/90   (!) 124/90  Pulse: (!) 116 (!) 113  (!) 121  Resp: (!) 25 (!) 29  (!) 26  Temp:   98.2 F (36.8 C) 98.2 F (36.8 C)  TempSrc:   Oral Oral  SpO2: 95% 97%  95%   No intake or output data in the 24 hours ending 01/18/24 1929    03/04/2023    1:15 PM 01/06/2023    1:10 PM 03/03/2022    3:24 PM  Last 3 Weights  Weight (lbs) 132 lb 9.6 oz 130 lb 14.4 oz 139 lb 8 oz  Weight (kg) 60.147 kg 59.376 kg 63.277 kg     There is no height or weight on file to calculate BMI.  physical exam per Dr. Jordan  EKG:  The EKG was personally reviewed and demonstrates:  atrial fibrillation with a rate of 114, a nonspecific IVCD, and LVH. Telemetry:  Telemetry was personally reviewed and demonstrates: Per Dr. Francyne  Relevant CV Studies: TTE  IMPRESSIONS     1. Left ventricular ejection fraction, by estimation, is 35 to 40%. The  left ventricle has moderately decreased function. The left ventricle   demonstrates global hypokinesis. There is mild concentric left ventricular  hypertrophy. Left ventricular  diastolic function could not be evaluated.   2. Right ventricular systolic function is moderately reduced. The right  ventricular size is normal. Tricuspid regurgitation signal is inadequate  for assessing PA pressure.   3. The mitral valve is normal in structure. Mild to moderate mitral valve  regurgitation. No evidence of mitral stenosis.   4. The aortic valve is normal in structure. Aortic valve regurgitation is  moderate. No aortic stenosis is present.   5. The inferior vena cava is normal in size with <50% respiratory  variability, suggesting right atrial pressure of 8 mmHg.   Laboratory Data: High Sensitivity Troponin:  No results for input(s): TROPONINIHS in the last 720 hours.  Recent Labs  Lab 01/18/24 1245  TRNPT 19      Chemistry Recent Labs  Lab 01/18/24 1245  NA 139  K 4.7  CL 104  CO2 26  GLUCOSE 112*  BUN 29*  CREATININE 1.01*  CALCIUM  9.1  MG 2.1  GFRNONAA 54*  ANIONGAP 10    Recent Labs  Lab 01/18/24 1245  PROT 6.1*  ALBUMIN 4.1  AST 37  ALT 28  ALKPHOS 70  BILITOT 0.7   Lipids  No results for input(s): CHOL, TRIG, HDL, LABVLDL, LDLCALC, CHOLHDL in the last 168 hours.  Hematology Recent Labs  Lab 01/18/24 1245  WBC 6.4  RBC 4.25  HGB 10.7*  HCT 35.3*  MCV 83.1  MCH 25.2*  MCHC 30.3  RDW 14.9  PLT 330   Thyroid  No results for input(s): TSH, FREET4 in the last 168 hours.  BNP Recent Labs  Lab 01/18/24 1245  PROBNP 1,834.0*    DDimer No results for input(s): DDIMER in the last 168 hours.  Radiology/Studies:  ECHOCARDIOGRAM COMPLETE Result Date: 01/18/2024    ECHOCARDIOGRAM REPORT   Patient Name:   Maureen [name] Date of Exam: 01/18/2024 Medical Rec #:  [id number]      Height:       62.0 in Accession #:    [id number]     Weight:       132.6 lb Date of Birth:  1940/04/16      BSA:          1.605 m Patient  Age:    83 years       BP:           124/90 mmHg Patient Gender: F              HR:           115 bpm. Exam Location:  Inpatient Procedure: 2D Echo, Cardiac Doppler, Color Doppler and Intracardiac            Opacification Agent (Both Spectral and Color Flow Doppler were            utilized during procedure). Indications:    CHF- Acute Diastolic I50.31  History:        Patient has prior history of Echocardiogram examinations, most                 recent 01/09/2021. Cardiomyopathy and CHF, Arrythmias:Atrial                 Fibrillation, Signs/Symptoms:Chest Pain and Shortness of Breath;                 Risk Factors:Hypertension and Sleep Apnea.  Sonographer:    Koleen Popper RDCS Referring Phys: [phone] RONDELL A SMITH IMPRESSIONS  1. Left ventricular ejection fraction, by estimation, is 35 to 40%. The left ventricle has moderately decreased function. The left ventricle demonstrates global hypokinesis. There is mild concentric left ventricular hypertrophy. Left ventricular diastolic function could not be evaluated.  2. Right ventricular systolic function is moderately reduced. The right ventricular size is normal. Tricuspid regurgitation signal is inadequate for assessing PA pressure.  3. The mitral valve is normal in structure. Mild to moderate mitral valve regurgitation. No evidence of mitral stenosis.  4. The aortic valve is normal in structure. Aortic valve regurgitation is moderate. No aortic stenosis is present.  5. The inferior vena cava is normal in size with <50% respiratory variability, suggesting right atrial pressure of 8 mmHg. FINDINGS  Left Ventricle: Left ventricular ejection fraction, by estimation, is 35 to 40%. The left ventricle has moderately decreased function. The left ventricle demonstrates global hypokinesis. Definity  contrast agent was given IV to delineate the left ventricular endocardial borders. The left ventricular internal cavity size was normal in size. There is mild concentric left  ventricular hypertrophy. Left ventricular diastolic function could not be evaluated due to atrial fibrillation. Left ventricular diastolic function could not be evaluated. Normal left ventricular filling pressure. Right Ventricle: The right ventricular size is normal. No  increase in right ventricular wall thickness. Right ventricular systolic function is moderately reduced. Tricuspid regurgitation signal is inadequate for assessing PA pressure. Left Atrium: Left atrial size was normal in size. Right Atrium: Right atrial size was normal in size. Pericardium: There is no evidence of pericardial effusion. Mitral Valve: The mitral valve is normal in structure. Mild to moderate mitral valve regurgitation. No evidence of mitral valve stenosis. Tricuspid Valve: The tricuspid valve is normal in structure. Tricuspid valve regurgitation is not demonstrated. No evidence of tricuspid stenosis. Aortic Valve: The aortic valve is normal in structure. Aortic valve regurgitation is moderate. No aortic stenosis is present. Pulmonic Valve: The pulmonic valve was normal in structure. Pulmonic valve regurgitation is mild. No evidence of pulmonic stenosis. Aorta: The aortic root is normal in size and structure. Venous: The inferior vena cava is normal in size with less than 50% respiratory variability, suggesting right atrial pressure of 8 mmHg. IAS/Shunts: No atrial level shunt detected by color flow Doppler.  LEFT VENTRICLE PLAX 2D LVIDd:         4.50 cm LVIDs:         3.80 cm LV PW:         1.10 cm LV IVS:        1.10 cm LVOT diam:     1.91 cm LV SV:         38 LV SV Index:   23 LVOT Area:     2.87 cm  LV Volumes (MOD) LV vol d, MOD A4C: 113.0 ml LV vol s, MOD A4C: 62.2 ml LV SV MOD A4C:     113.0 ml RIGHT VENTRICLE            IVC RV Basal diam:  3.46 cm    IVC diam: 2.03 cm RV S prime:     9.65 cm/s TAPSE (M-mode): 1.5 cm LEFT ATRIUM             Index        RIGHT ATRIUM           Index LA diam:        4.22 cm 2.63 cm/m   RA Area:      13.20 cm LA Vol (A2C):   45.1 ml 28.09 ml/m  RA Volume:   29.40 ml  18.31 ml/m LA Vol (A4C):   45.9 ml 28.59 ml/m LA Biplane Vol: 45.8 ml 28.53 ml/m  AORTIC VALVE LVOT Vmax:   88.60 cm/s LVOT Vmean:  59.700 cm/s LVOT VTI:    0.131 m  AORTA Ao Root diam: 3.20 cm Ao Asc diam:  3.74 cm MR Peak grad: 79.9 mmHg MR Vmax:      447.00 cm/s SHUNTS                           Systemic VTI:  0.13 m                           Systemic Diam: 1.91 cm Morene Brownie Electronically signed by Morene Brownie Signature Date/Time: 01/18/2024/5:42:06 PM    Final    DG Chest Portable 1 View Result Date: 01/18/2024 EXAM: 1 VIEW(S) XRAY OF THE CHEST 01/18/2024 12:27:15 PM COMPARISON: Chest x-ray 11/13/2017, CT abdomen and pelvis at 11:20:21. CLINICAL HISTORY: dyspnea, afib FINDINGS: LUNGS AND PLEURA: Perihilar and bibasilar interstitial opacities. Possible trace bilateral pleural effusions. No pneumothorax. HEART AND MEDIASTINUM: Mild cardiomegaly. No acute abnormality of the  mediastinal silhouette. BONES AND SOFT TISSUES: Multilevel thoracic osteophytosis. No acute osseous abnormality. IMPRESSION: 1. Perihilar and bibasilar interstitial opacities with possible trace bilateral pleural effusions. 2. Mild cardiomegaly. Electronically signed by: Morgane Naveau MD 01/18/2024 02:01 PM EST RP Workstation: [id number]     Assessment and Plan: Maureen [name] is a 83 y.o. female with a hx of paroxysmal atrial fibrillation on Eliquis , combined systolic and diastolic heart failure, hypertension, nonischemic cardiomyopathy, and hyperlipidemia who is being seen 01/18/2024 for the evaluation of atrial fibrillation at the request of Rondell Smith.  Paroxysmal atrial fibrillation Patient has a history of atrial fibrillation. EKG showed atrial fibrillation with a rate of 114, a nonspecific IVCD, and LVH.  Was initially placed on Cardizem  but this was held after the patient's LVEF was found to be reduced. Labs show potassium of 4.7 and  magnesium of 2.1. Ordered TSH Was started on bolus and IV amiodarone . Plan for a cardioversion tomorrow. Continue Eliquis  5 mg twice daily.  Combined systolic and diastolic heart failure TTE showed a reduced LVEF of 35 to 40%, global hypokinesis, mild concentric LVH, moderately reduced RV systolic function, mild to moderate MR, moderate AR, and IVC with less than 50% respiratory variability. In 2020 had A-fib with RVR and heart failure.  Cardiac cath at that time showed nonobstructive CAD. Labs showed elevated proBNP of 1834.  Creatinine was 1.01. Was started on Lasix  40 mg twice daily. Will prioritize rate control and diuresis of GDMT for now.  Plan to start GDMT prior to discharge.   Risk Assessment/Risk Scores:       New York  Heart Association (NYHA) Functional Class NYHA Class II  CHA2DS2-VASc Score = 8   This indicates a 10.8% annual risk of stroke. The patient's score is based upon: CHF History: 1 HTN History: 1 Diabetes History: 0 Stroke History: 2 Vascular Disease History: 1 Age Score: 2 Gender Score: 1        For questions or updates, please contact [facility] Please consult [email or web address] for contact info under      Signed, Morse Clause, PA-C  01/18/2024 7:29 PM  I have seen and examined the patient along with Zane Adams, PA.  I have reviewed the chart, notes and new data.  I agree with PA's note.  Key new complaints: She feels better since arriving at the hospital.  Is not aware of palpitations, but presented due to shortness of breath, symptoms very similar to her previous presentations with tachycardia cardiomyopathy and her lungs were full of fluid.  About 5 pounds in the last 4 days and she has mild orthopnea.  Has been short of breath since Christmas Day, roughly 4 days now.  Has also been having some vague chest discomfort and recurrent bouts with soft bowel movements. Key examination changes:  General: Alert, oriented x3, no  distress Head: no evidence of trauma, PERRL, EOMI, no exophtalmos or lid lag, no myxedema, no xanthelasma; normal ears, nose and oropharynx Neck: normal jugular venous pulsations and no hepatojugular reflux; brisk carotid pulses without delay and no carotid bruits Chest: clear to auscultation, no signs of consolidation by percussion or palpation, normal fremitus, symmetrical and full respiratory excursions Cardiovascular: normal position and quality of the apical impulse, rapid regular rhythm, normal first and second heart sounds, no murmurs, rubs or gallops Abdomen: no tenderness or distention, no masses by palpation, no abnormal pulsatility or arterial bruits, normal bowel sounds, no hepatosplenomegaly Extremities: no clubbing, cyanosis or edema; 2+ radial, ulnar and brachial pulses  bilaterally; 2+ right femoral, posterior tibial and dorsalis pedis pulses; 2+ left femoral, posterior tibial and dorsalis pedis pulses; no subclavian or femoral bruits Neurological: grossly nonfocal Psych: Normal mood and affect Key new findings / data: Senting ECG was interpreted as showing atrial fibrillation but may actually represent atypical atrial flutter with variable AV block.  Has a chronic nonspecific intraventricular conduction delay/atypical LBBB with a QRS duration of about 132 ms, without overt ischemic changes.  Telemetry now appears to show atrial flutter with 2: 1 AV block a regular rate at 124 bpm. The ECG from initial presentation on February 17, 2018 shows a very similar pattern of atypical atrial flutter with 2: 1 AV block. Another ECG dated December 10, 2019 appears more consistent with atrial fibrillation, although some distinct flutter waves are seen in the second half of the tracing. Cardiogram shows depressed left ventricular systolic function with EF 35-40% and global hypokinesis.  There is also moderate RV dysfunction.  There are no hemodynamically meaningful valve abnormalities.  The left and  right atria were described as normal in size, but the measurements actually suggest that she has a moderately dilated left atrium.  PLAN: Suspect she has recurrent tachycardia cardiomyopathy due to uncontrolled atypical atrial flutter with rapid ventricular rates.  It is likely that she has had the arrhythmia for weeks rather than a few days, since she is unaware of palpitations.  She will benefit from cardioversion.  She reports that she has not missed more than 1 dose of Eliquis  in the last 3 weeks so I do not think she will need a transesophageal echocardiogram.  Hopefully we can get on the schedule for tomorrow.  In the meantime we will load with intravenous amiodarone  for better rate control and to increase the odds of maintenance of normal rhythm after the cardioversion.  In the long run I think she would benefit from ablation, to avoid possible toxicity from lifelong amiodarone  therapy..  Informed Consent   Shared Decision Making/Informed Consent The risks (stroke, cardiac arrhythmias rarely resulting in the need for a temporary or permanent pacemaker, skin irritation or burns and complications associated with conscious sedation including aspiration, arrhythmia, respiratory failure and death), benefits (restoration of normal sinus rhythm) and alternatives of a direct current cardioversion were explained in detail to Maureen [name] and she agrees to proceed.        Jerel Balding, MD, [facility] [phone] 01/18/2024, 8:54 PM       [1]  Allergies Allergen Reactions   Dilantin [Phenytoin Sodium Extended] Other (See Comments)     Stupor, ataxia.    Hydrocodone Nausea And Vomiting   "

## 2024-01-18 NOTE — Progress Notes (Signed)
" °  Echocardiogram 2D Echocardiogram has been performed.  Koleen KANDICE Popper, RDCS 01/18/2024, 4:46 PM "

## 2024-01-19 ENCOUNTER — Other Ambulatory Visit (HOSPITAL_COMMUNITY): Payer: Self-pay

## 2024-01-19 ENCOUNTER — Observation Stay (HOSPITAL_COMMUNITY)

## 2024-01-19 ENCOUNTER — Encounter (HOSPITAL_COMMUNITY): Payer: Self-pay | Admitting: Cardiology

## 2024-01-19 ENCOUNTER — Encounter (HOSPITAL_COMMUNITY)
Admission: EM | Disposition: A | Discharge status: Home / Self Care | Payer: Self-pay | Source: Home / Self Care | Attending: Emergency Medicine

## 2024-01-19 DIAGNOSIS — I4891 Unspecified atrial fibrillation: Secondary | ICD-10-CM | POA: Diagnosis not present

## 2024-01-19 DIAGNOSIS — I110 Hypertensive heart disease with heart failure: Secondary | ICD-10-CM | POA: Diagnosis not present

## 2024-01-19 DIAGNOSIS — Z7901 Long term (current) use of anticoagulants: Secondary | ICD-10-CM | POA: Diagnosis not present

## 2024-01-19 DIAGNOSIS — N1831 Chronic kidney disease, stage 3a: Secondary | ICD-10-CM | POA: Diagnosis not present

## 2024-01-19 DIAGNOSIS — E782 Mixed hyperlipidemia: Secondary | ICD-10-CM | POA: Diagnosis not present

## 2024-01-19 DIAGNOSIS — G40909 Epilepsy, unspecified, not intractable, without status epilepticus: Secondary | ICD-10-CM | POA: Diagnosis not present

## 2024-01-19 DIAGNOSIS — I480 Paroxysmal atrial fibrillation: Secondary | ICD-10-CM | POA: Diagnosis not present

## 2024-01-19 DIAGNOSIS — D649 Anemia, unspecified: Secondary | ICD-10-CM | POA: Diagnosis not present

## 2024-01-19 DIAGNOSIS — I5042 Chronic combined systolic (congestive) and diastolic (congestive) heart failure: Secondary | ICD-10-CM | POA: Diagnosis not present

## 2024-01-19 DIAGNOSIS — I5023 Acute on chronic systolic (congestive) heart failure: Secondary | ICD-10-CM

## 2024-01-19 DIAGNOSIS — G4733 Obstructive sleep apnea (adult) (pediatric): Secondary | ICD-10-CM | POA: Diagnosis not present

## 2024-01-19 DIAGNOSIS — Z79899 Other long term (current) drug therapy: Secondary | ICD-10-CM | POA: Diagnosis not present

## 2024-01-19 DIAGNOSIS — I130 Hypertensive heart and chronic kidney disease with heart failure and stage 1 through stage 4 chronic kidney disease, or unspecified chronic kidney disease: Secondary | ICD-10-CM | POA: Diagnosis not present

## 2024-01-19 DIAGNOSIS — I429 Cardiomyopathy, unspecified: Secondary | ICD-10-CM | POA: Diagnosis not present

## 2024-01-19 HISTORY — PX: CARDIOVERSION: EP1203

## 2024-01-19 LAB — CBC
HCT: 34 % — ABNORMAL LOW (ref 36.0–46.0)
Hemoglobin: 10.5 g/dL — ABNORMAL LOW (ref 12.0–15.0)
MCH: 25 pg — ABNORMAL LOW (ref 26.0–34.0)
MCHC: 30.9 g/dL (ref 30.0–36.0)
MCV: 81 fL (ref 80.0–100.0)
Platelets: 269 K/uL (ref 150–400)
RBC: 4.2 MIL/uL (ref 3.87–5.11)
RDW: 14.9 % (ref 11.5–15.5)
WBC: 6.1 K/uL (ref 4.0–10.5)
nRBC: 0 % (ref 0.0–0.2)

## 2024-01-19 LAB — BASIC METABOLIC PANEL WITH GFR
Anion gap: 10 (ref 5–15)
BUN: 25 mg/dL — ABNORMAL HIGH (ref 8–23)
CO2: 27 mmol/L (ref 22–32)
Calcium: 9.2 mg/dL (ref 8.9–10.3)
Chloride: 102 mmol/L (ref 98–111)
Creatinine, Ser: 1.11 mg/dL — ABNORMAL HIGH (ref 0.44–1.00)
GFR, Estimated: 48 mL/min — ABNORMAL LOW
Glucose, Bld: 104 mg/dL — ABNORMAL HIGH (ref 70–99)
Potassium: 3.9 mmol/L (ref 3.5–5.1)
Sodium: 138 mmol/L (ref 135–145)

## 2024-01-19 LAB — TSH: TSH: 4.89 u[IU]/mL — ABNORMAL HIGH (ref 0.350–4.500)

## 2024-01-19 MED ORDER — LIDOCAINE 2% (20 MG/ML) 5 ML SYRINGE
INTRAMUSCULAR | Status: DC | PRN
  Administered 2024-01-19: 60 mg via INTRAVENOUS

## 2024-01-19 MED ORDER — SACUBITRIL-VALSARTAN 24-26 MG PO TABS
1.0000 | ORAL_TABLET | Freq: Two times a day (BID) | ORAL | Status: DC
  Administered 2024-01-19: 1 via ORAL
  Filled 2024-01-19: qty 1

## 2024-01-19 MED ORDER — SODIUM CHLORIDE 0.9 % IV SOLN: INTRAVENOUS | Status: DC

## 2024-01-19 MED ORDER — PROPOFOL 10 MG/ML IV BOLUS
INTRAVENOUS | Status: DC | PRN
  Administered 2024-01-19: 50 mg via INTRAVENOUS

## 2024-01-19 MED ORDER — APIXABAN 5 MG PO TABS
ORAL_TABLET | ORAL | Status: AC
  Filled 2024-01-19: qty 1

## 2024-01-19 MED ORDER — SACUBITRIL-VALSARTAN 24-26 MG PO TABS
1.0000 | ORAL_TABLET | Freq: Two times a day (BID) | ORAL | 0 refills | Status: AC
  Filled 2024-01-19: qty 60, 30d supply, fill #0

## 2024-01-19 NOTE — Anesthesia Postprocedure Evaluation (Signed)
"   Anesthesia Post Note  Patient: Maureen [name]  Procedure(s) Performed: CARDIOVERSION     Patient location during evaluation: PACU Anesthesia Type: General Level of consciousness: awake and alert Pain management: pain level controlled Vital Signs Assessment: post-procedure vital signs reviewed and stable Respiratory status: spontaneous breathing, nonlabored ventilation, respiratory function stable and patient connected to nasal cannula oxygen Cardiovascular status: blood pressure returned to baseline and stable Postop Assessment: no apparent nausea or vomiting Anesthetic complications: no   No notable events documented.  Last Vitals:  Vitals:   01/19/24 0845 01/19/24 0920  BP: 128/73 (!) 143/69  Pulse: 62 64  Resp: (!) 22 16  Temp:  (!) 36.4 C  SpO2: 93% 99%    Last Pain:  Vitals:   01/19/24 0920  TempSrc: Oral  PainSc:                  Thom JONELLE Peoples      "

## 2024-01-19 NOTE — Interval H&P Note (Signed)
 History and Physical Interval Note:  01/19/2024 7:50 AM  Alisa DELENA Dunker  has presented today for surgery, with the diagnosis of afib.  The various methods of treatment have been discussed with the patient and family. After consideration of risks, benefits and other options for treatment, the patient has consented to  Procedures: CARDIOVERSION (N/A) as a surgical intervention.  The patient's history has been reviewed, patient examined, no change in status, stable for surgery.  I have reviewed the patient's chart and labs.  Questions were answered to the patient's satisfaction.     Wilbert Bihari

## 2024-01-19 NOTE — Discharge Instructions (Addendum)
 SABRA

## 2024-01-19 NOTE — Discharge Summary (Signed)
 " Physician Discharge Summary   Patient: Maureen [name] MRN: [id number] DOB: [birth year]  Admit date:     01/18/2024  Discharge date: 01/19/2024  Discharge Physician: Lonni SHAUNNA Dalton   PCP: Loreli Kins, MD     Recommendations at discharge:  Follow up with Cardiology Dr. Anner within 1 month for recurrent A-fib and tachyarrhythmic cardiomyopathy Defer outpatient repeat echocardiogram to cardiology Started on new Entresto  Follow-up with PCP Dr. Loreli in 1 week Dr. Loreli: Please check BMP and CBC in 1 week (discharge potassium 3.9, creatinine 1.1, hemoglobin 10.5)     Discharge Diagnoses: Principal Problem:   Atrial fibrillation with RVR Active Problems:   Chronic systolic congestive heart failure   Tachyarrhythmic cardiomyopathy   Normocytic anemia   Chronic kidney disease, stage 3a (HCC)   Seizure disorder (HCC)   Mixed hyperlipidemia      Hospital Course: 83 y.o. F with pAF, dCHF, hx AF in 2020 with rate-related CM, HLD, HTN, and seizure disorder who presented with about a week of palpitations and progressive shortness of breath with exertion.  In the ER, found to have A-fib with RVR, chest x-ray with congestive heart failure, and elevated BNP.     Paroxysmal atrial fibrillation with RVR Combined systolic and diastolic congestive heart failure Patient was admitted and started on intravenous amiodarone .  Cardiology were consulted, her electrolytes were optimized, TSH was normal.  Treated with Lasix .  She underwent cardioversion which was successful in this morning.  Afterwards she felt improved, cardiology recommended transition to Entresto , resumption of home amiodarone , and cleared for discharge.  Echocardiogram showed reduced EF down to 35 to 40%, likely rate related cardiomyopathy.  She has close follow-up with cardiology.  Recommend BMP in 1 week.        The [place]  Controlled Substances Registry was reviewed for this patient prior to  discharge.  Consultants: Cardiology Procedures performed: Cardioversion Disposition: Home Diet recommendation:  Cardiac diet  DISCHARGE MEDICATION: Allergies as of 01/19/2024       Reactions   Dilantin [phenytoin Sodium Extended] Other (See Comments)   Stupor Ataxia    Hydrocodone Nausea And Vomiting        Medication List     STOP taking these medications    losartan  50 MG tablet Commonly known as: COZAAR        TAKE these medications    acetaminophen  500 MG tablet Commonly known as: TYLENOL  Take 1,000 mg by mouth 2 (two) times daily as needed for headache or fever (pain).   amiodarone  200 MG tablet Commonly known as: PACERONE  Take 0.5 tablets (100 mg total) by mouth daily.   Eliquis  5 MG Tabs tablet Generic drug: apixaban  Take 1 tablet by mouth twice daily.   levETIRAcetam  500 MG tablet Commonly known as: KEPPRA  Take 1 tablet by mouth twice daily.   pravastatin  40 MG tablet Commonly known as: PRAVACHOL  Take 40 mg by mouth daily.   sacubitril -valsartan  24-26 MG Commonly known as: ENTRESTO  Take 1 tablet by mouth 2 (two) times daily.   VITAMIN D -3 PO Take 1 tablet by mouth daily.   VITAMIN E PO Take 1 capsule by mouth daily.        Follow-up Information     Loreli Kins, MD. Schedule an appointment as soon as possible for a visit in 1 week(s).   Specialty: Family Medicine Contact information: [address] [place] [address] [phone]         Anner Alm ORN, MD. Go to.  Specialty: Cardiology Contact information: [address] [place] [address] [phone]                 Discharge Instructions     Discharge instructions   Complete by: As directed    **IMPORTANT DISCHARGE INSTRUCTIONS**   From Dr. Jonel: You were admitted for shortness of breath  Here, we found that you were in atrial fibrillation and it had again caused heart failure  You had an echocardiogram here that  showed the heart muscle was weak again (We call this tachyarrthmia-mediated cardiomyopathy, in other words, fast-heart-rate-make-heart-weak)  Thankfully, we were able to shock the heart into a normal rhythm.  Just like last time, we think eventually, your heart squeeze/strength will go back to normal (like it was on your last echocardiogram in 2022)  STOP losartan  START the new medicine ENtresto  Check your blood pressure twice daily for the next week on the new medicine Call Dr. Loreli if the blood pressure is severely low or if you are too dizzy to stand  Continue your amiodarone  100 mg (half tab) daily, apixaban  5 mg twice daily, and pravastatin   Go see Dr. Loreli in 1 week for a check up, call her today to set that up  Call Dr. Genice office to confirm your appoitment, if you haven't heard from them in a few days.   Increase activity slowly   Complete by: As directed        Discharge Exam: Filed Weights   01/19/24 1100  Weight: 60.1 kg    General: Pt is alert, awake, not in acute distress Cardiovascular: RRR, nl S1-S2, no murmurs appreciated.   No LE edema.   Respiratory: Normal respiratory rate and rhythm.  CTAB without rales or wheezes. Abdominal: Abdomen soft and non-tender.  No distension or HSM.   Neuro/Psych: Strength symmetric in upper and lower extremities.  Judgment and insight appear normal.   Condition at discharge: good  The results of significant diagnostics from this hospitalization (including imaging, microbiology, ancillary and laboratory) are listed below for reference.   Imaging Studies: EP STUDY Result Date: 01/19/2024 See surgical note for result.  ECHOCARDIOGRAM COMPLETE Result Date: 01/18/2024    ECHOCARDIOGRAM REPORT   Patient Name:   Maureen [name] Date of Exam: 01/18/2024 Medical Rec #:  [id number]      Height:       62.0 in Accession #:    [id number]     Weight:       132.6 lb Date of Birth:  28-Aug-1940      BSA:          1.605 m Patient  Age:    83 years       BP:           124/90 mmHg Patient Gender: F              HR:           115 bpm. Exam Location:  Inpatient Procedure: 2D Echo, Cardiac Doppler, Color Doppler and Intracardiac            Opacification Agent (Both Spectral and Color Flow Doppler were            utilized during procedure). Indications:    CHF- Acute Diastolic I50.31  History:        Patient has prior history of Echocardiogram examinations, most                 recent 01/09/2021. Cardiomyopathy and CHF, Arrythmias:Atrial  Fibrillation, Signs/Symptoms:Chest Pain and Shortness of Breath;                 Risk Factors:Hypertension and Sleep Apnea.  Sonographer:    Koleen Popper RDCS Referring Phys: [phone] RONDELL A SMITH IMPRESSIONS  1. Left ventricular ejection fraction, by estimation, is 35 to 40%. The left ventricle has moderately decreased function. The left ventricle demonstrates global hypokinesis. There is mild concentric left ventricular hypertrophy. Left ventricular diastolic function could not be evaluated.  2. Right ventricular systolic function is moderately reduced. The right ventricular size is normal. Tricuspid regurgitation signal is inadequate for assessing PA pressure.  3. The mitral valve is normal in structure. Mild to moderate mitral valve regurgitation. No evidence of mitral stenosis.  4. The aortic valve is normal in structure. Aortic valve regurgitation is moderate. No aortic stenosis is present.  5. The inferior vena cava is normal in size with <50% respiratory variability, suggesting right atrial pressure of 8 mmHg. FINDINGS  Left Ventricle: Left ventricular ejection fraction, by estimation, is 35 to 40%. The left ventricle has moderately decreased function. The left ventricle demonstrates global hypokinesis. Definity  contrast agent was given IV to delineate the left ventricular endocardial borders. The left ventricular internal cavity size was normal in size. There is mild concentric left  ventricular hypertrophy. Left ventricular diastolic function could not be evaluated due to atrial fibrillation. Left ventricular diastolic function could not be evaluated. Normal left ventricular filling pressure. Right Ventricle: The right ventricular size is normal. No increase in right ventricular wall thickness. Right ventricular systolic function is moderately reduced. Tricuspid regurgitation signal is inadequate for assessing PA pressure. Left Atrium: Left atrial size was normal in size. Right Atrium: Right atrial size was normal in size. Pericardium: There is no evidence of pericardial effusion. Mitral Valve: The mitral valve is normal in structure. Mild to moderate mitral valve regurgitation. No evidence of mitral valve stenosis. Tricuspid Valve: The tricuspid valve is normal in structure. Tricuspid valve regurgitation is not demonstrated. No evidence of tricuspid stenosis. Aortic Valve: The aortic valve is normal in structure. Aortic valve regurgitation is moderate. No aortic stenosis is present. Pulmonic Valve: The pulmonic valve was normal in structure. Pulmonic valve regurgitation is mild. No evidence of pulmonic stenosis. Aorta: The aortic root is normal in size and structure. Venous: The inferior vena cava is normal in size with less than 50% respiratory variability, suggesting right atrial pressure of 8 mmHg. IAS/Shunts: No atrial level shunt detected by color flow Doppler.  LEFT VENTRICLE PLAX 2D LVIDd:         4.50 cm LVIDs:         3.80 cm LV PW:         1.10 cm LV IVS:        1.10 cm LVOT diam:     1.91 cm LV SV:         38 LV SV Index:   23 LVOT Area:     2.87 cm  LV Volumes (MOD) LV vol d, MOD A4C: 113.0 ml LV vol s, MOD A4C: 62.2 ml LV SV MOD A4C:     113.0 ml RIGHT VENTRICLE            IVC RV Basal diam:  3.46 cm    IVC diam: 2.03 cm RV S prime:     9.65 cm/s TAPSE (M-mode): 1.5 cm LEFT ATRIUM             Index  RIGHT ATRIUM           Index LA diam:        4.22 cm 2.63 cm/m   RA Area:      13.20 cm LA Vol (A2C):   45.1 ml 28.09 ml/m  RA Volume:   29.40 ml  18.31 ml/m LA Vol (A4C):   45.9 ml 28.59 ml/m LA Biplane Vol: 45.8 ml 28.53 ml/m  AORTIC VALVE LVOT Vmax:   88.60 cm/s LVOT Vmean:  59.700 cm/s LVOT VTI:    0.131 m  AORTA Ao Root diam: 3.20 cm Ao Asc diam:  3.74 cm MR Peak grad: 79.9 mmHg MR Vmax:      447.00 cm/s SHUNTS                           Systemic VTI:  0.13 m                           Systemic Diam: 1.91 cm Morene Brownie Electronically signed by Morene Brownie Signature Date/Time: 01/18/2024/5:42:06 PM    Final    DG Chest Portable 1 View Result Date: 01/18/2024 EXAM: 1 VIEW(S) XRAY OF THE CHEST 01/18/2024 12:27:15 PM COMPARISON: Chest x-ray 11/13/2017, CT abdomen and pelvis at 11:20:21. CLINICAL HISTORY: dyspnea, afib FINDINGS: LUNGS AND PLEURA: Perihilar and bibasilar interstitial opacities. Possible trace bilateral pleural effusions. No pneumothorax. HEART AND MEDIASTINUM: Mild cardiomegaly. No acute abnormality of the mediastinal silhouette. BONES AND SOFT TISSUES: Multilevel thoracic osteophytosis. No acute osseous abnormality. IMPRESSION: 1. Perihilar and bibasilar interstitial opacities with possible trace bilateral pleural effusions. 2. Mild cardiomegaly. Electronically signed by: Morgane Naveau MD 01/18/2024 02:01 PM EST RP Workstation: [id number]    Microbiology: Results for orders placed or performed during the hospital encounter of 01/18/24  Respiratory (~20 pathogens) panel by PCR     Status: None   Collection Time: 01/18/24  2:50 PM   Specimen: Nasopharyngeal Swab; Respiratory  Result Value Ref Range Status   Adenovirus NOT DETECTED NOT DETECTED Final   Coronavirus 229E NOT DETECTED NOT DETECTED Final    Comment: (NOTE) The Coronavirus on the Respiratory Panel, DOES NOT test for the novel  Coronavirus (2019 nCoV)    Coronavirus HKU1 NOT DETECTED NOT DETECTED Final   Coronavirus NL63 NOT DETECTED NOT DETECTED Final   Coronavirus OC43 NOT  DETECTED NOT DETECTED Final   Metapneumovirus NOT DETECTED NOT DETECTED Final   Rhinovirus / Enterovirus NOT DETECTED NOT DETECTED Final   Influenza A NOT DETECTED NOT DETECTED Final   Influenza B NOT DETECTED NOT DETECTED Final   Parainfluenza Virus 1 NOT DETECTED NOT DETECTED Final   Parainfluenza Virus 2 NOT DETECTED NOT DETECTED Final   Parainfluenza Virus 3 NOT DETECTED NOT DETECTED Final   Parainfluenza Virus 4 NOT DETECTED NOT DETECTED Final   Respiratory Syncytial Virus NOT DETECTED NOT DETECTED Final   Bordetella pertussis NOT DETECTED NOT DETECTED Final   Bordetella Parapertussis NOT DETECTED NOT DETECTED Final   Chlamydophila pneumoniae NOT DETECTED NOT DETECTED Final   Mycoplasma pneumoniae NOT DETECTED NOT DETECTED Final    Comment: Performed at [facility], [address] [place], [place] [address]    Labs: CBC: Recent Labs  Lab 01/18/24 1245 01/19/24 0511  WBC 6.4 6.1  NEUTROABS 4.8  --   HGB 10.7* 10.5*  HCT 35.3* 34.0*  MCV 83.1 81.0  PLT 330 269   Basic Metabolic Panel: Recent  Labs  Lab 01/18/24 1245 01/18/24 2052 01/19/24 0511  NA 139  --  138  K 4.7  --  3.9  CL 104  --  102  CO2 26  --  27  GLUCOSE 112*  --  104*  BUN 29*  --  25*  CREATININE 1.01*  --  1.11*  CALCIUM  9.1  --  9.2  MG 2.1 2.1  --    Liver Function Tests: Recent Labs  Lab 01/18/24 1245  AST 37  ALT 28  ALKPHOS 70  BILITOT 0.7  PROT 6.1*  ALBUMIN 4.1   CBG: No results for input(s): GLUCAP in the last 168 hours.  Discharge time spent: approximately 45 minutes spent on discharge counseling, evaluation of patient on day of discharge, and coordination of discharge planning with nursing, social work, pharmacy and case management  Signed: Lonni SHAUNNA Dalton, MD [facility] 01/19/2024         "

## 2024-01-19 NOTE — Progress Notes (Signed)
 Discharge   Patient and son expressed verbal understanding of discharge POC.   Patient and son given time to ask any questions.  Additional education included in AVS.  Alert oriented in good spirits.   Tele from monitor  and PIV removed. Pressure dressings intact. CCMD/ Umna.  All personal belongings at bedtime.

## 2024-01-19 NOTE — Progress Notes (Signed)
 Heart Failure Navigator Progress Note  Assessed for Heart & Vascular TOC clinic readiness.  Patient does not meet criteria due to she has a scheduled [facility] appointment on 02/15/2024. Plans for discharge 01/19/2024. .   Navigator will sign off at this time.   Stephane Haddock, BSN, Scientist, Clinical (histocompatibility And Immunogenetics) Only

## 2024-01-19 NOTE — CV Procedure (Signed)
" ° ° °  Electrical Cardioversion Procedure Note Maureen [name] [id number] [birth year]  Procedure: Electrical Cardioversion Indications:  Atrial Fibrillation  Time Out: Verified patient identification, verified procedure,medications/allergies/relevent history reviewed, required imaging and test results available.  Performed  Procedure Details  During this procedure the patient is administered a total of Propofol  50 mg and Lidocaine  60 mg to achieve and maintain moderate conscious sedation.  The patient's heart rate, blood pressure, and oxygen saturation are monitored continuously during the procedure. The period of conscious sedation is 2 minutes, of which I was present face-to-face 100% of this time. Murry Knoll, CRNA is an independent, trained observer who assisted in the monitoring of the patient's level of consciousness.     Cardioversion was done with synchronized biphasic defibrillation with AP pads with 200watts.  The patient converted to normal sinus rhythm. The patient tolerated the procedure well   IMPRESSION:  Successful cardioversion of atrial fibrillation    Maureen [name] 01/19/2024, 7:50 AM  "

## 2024-01-19 NOTE — Transfer of Care (Signed)
 Immediate Anesthesia Transfer of Care Note  Patient: Maureen [name]  Procedure(s) Performed: CARDIOVERSION  Patient Location: Cath Lab  Anesthesia Type:MAC  Level of Consciousness: drowsy  Airway & Oxygen Therapy: Patient Spontanous Breathing and Patient connected to nasal cannula oxygen  Post-op Assessment: Report given to RN and Post -op Vital signs reviewed and stable  Post vital signs: Reviewed and stable  Last Vitals:  Vitals Value Taken Time  BP    Temp    Pulse    Resp    SpO2      Last Pain:  Vitals:   01/19/24 0708  TempSrc: Oral  PainSc: 0-No pain         Complications: No notable events documented.

## 2024-01-19 NOTE — Progress Notes (Signed)
 "   Progress Note  Patient Name: Maureen [name] Date of Encounter: 01/19/2024  Primary Cardiologist:   Alm Clay, MD   Subjective   Denies chest pain.   Status post DCCV.    Inpatient Medications    Scheduled Meds:  apixaban        apixaban   5 mg Oral BID   furosemide   40 mg Intravenous BID   levETIRAcetam   500 mg Oral BID   pravastatin   40 mg Oral Daily   sodium chloride  flush  3 mL Intravenous Q12H   Continuous Infusions:  amiodarone  30 mg/hr (01/19/24 0104)   PRN Meds: acetaminophen  **OR** acetaminophen , albuterol , apixaban , ondansetron  **OR** ondansetron  (ZOFRAN ) IV   Vital Signs    Vitals:   01/19/24 0835 01/19/24 0840 01/19/24 0845 01/19/24 0920  BP: 122/72 131/68 128/73 (!) 143/69  Pulse: 62 62 62 64  Resp: 19 (!) 24 (!) 22 16  Temp:    (!) 97.5 F (36.4 C)  TempSrc:    Oral  SpO2: 97% 93% 93% 99%    Intake/Output Summary (Last 24 hours) at 01/19/2024 0937 Last data filed at 01/19/2024 0235 Gross per 24 hour  Intake 249.79 ml  Output 50 ml  Net 199.79 ml   There were no vitals filed for this visit.  Telemetry    NSR - Personally Reviewed  ECG    NA - Personally Reviewed  Physical Exam   GEN: No acute distress.   Neck: No  JVD Cardiac: RRR, no murmurs, rubs, or gallops.  Respiratory: Clear  to auscultation bilaterally. GI: Soft, nontender, non-distended  MS: No  edema; No deformity. Neuro:  Nonfocal  Psych: Normal affect   Labs    Chemistry Recent Labs  Lab 01/18/24 1245 01/19/24 0511  NA 139 138  K 4.7 3.9  CL 104 102  CO2 26 27  GLUCOSE 112* 104*  BUN 29* 25*  CREATININE 1.01* 1.11*  CALCIUM  9.1 9.2  PROT 6.1*  --   ALBUMIN 4.1  --   AST 37  --   ALT 28  --   ALKPHOS 70  --   BILITOT 0.7  --   GFRNONAA 54* 48*  ANIONGAP 10 10     Hematology Recent Labs  Lab 01/18/24 1245 01/19/24 0511  WBC 6.4 6.1  RBC 4.25 4.20  HGB 10.7* 10.5*  HCT 35.3* 34.0*  MCV 83.1 81.0  MCH 25.2* 25.0*  MCHC 30.3 30.9  RDW  14.9 14.9  PLT 330 269    Cardiac EnzymesNo results for input(s): TROPONINI in the last 168 hours. No results for input(s): TROPIPOC in the last 168 hours.   BNP Recent Labs  Lab 01/18/24 1245  PROBNP 1,834.0*     DDimer No results for input(s): DDIMER in the last 168 hours.   Radiology    EP STUDY Result Date: 01/19/2024 See surgical note for result.  ECHOCARDIOGRAM COMPLETE Result Date: 01/18/2024    ECHOCARDIOGRAM REPORT   Patient Name:   Maureen [name] Date of Exam: 01/18/2024 Medical Rec #:  [id number]      Height:       62.0 in Accession #:    [id number]     Weight:       132.6 lb Date of Birth:  [birth year]      BSA:          1.605 m Patient Age:    83 years       BP:  124/90 mmHg Patient Gender: F              HR:           115 bpm. Exam Location:  Inpatient Procedure: 2D Echo, Cardiac Doppler, Color Doppler and Intracardiac            Opacification Agent (Both Spectral and Color Flow Doppler were            utilized during procedure). Indications:    CHF- Acute Diastolic I50.31  History:        Patient has prior history of Echocardiogram examinations, most                 recent 01/09/2021. Cardiomyopathy and CHF, Arrythmias:Atrial                 Fibrillation, Signs/Symptoms:Chest Pain and Shortness of Breath;                 Risk Factors:Hypertension and Sleep Apnea.  Sonographer:    Koleen Popper RDCS Referring Phys: [phone] RONDELL A SMITH IMPRESSIONS  1. Left ventricular ejection fraction, by estimation, is 35 to 40%. The left ventricle has moderately decreased function. The left ventricle demonstrates global hypokinesis. There is mild concentric left ventricular hypertrophy. Left ventricular diastolic function could not be evaluated.  2. Right ventricular systolic function is moderately reduced. The right ventricular size is normal. Tricuspid regurgitation signal is inadequate for assessing PA pressure.  3. The mitral valve is normal in structure. Mild to moderate  mitral valve regurgitation. No evidence of mitral stenosis.  4. The aortic valve is normal in structure. Aortic valve regurgitation is moderate. No aortic stenosis is present.  5. The inferior vena cava is normal in size with <50% respiratory variability, suggesting right atrial pressure of 8 mmHg. FINDINGS  Left Ventricle: Left ventricular ejection fraction, by estimation, is 35 to 40%. The left ventricle has moderately decreased function. The left ventricle demonstrates global hypokinesis. Definity  contrast agent was given IV to delineate the left ventricular endocardial borders. The left ventricular internal cavity size was normal in size. There is mild concentric left ventricular hypertrophy. Left ventricular diastolic function could not be evaluated due to atrial fibrillation. Left ventricular diastolic function could not be evaluated. Normal left ventricular filling pressure. Right Ventricle: The right ventricular size is normal. No increase in right ventricular wall thickness. Right ventricular systolic function is moderately reduced. Tricuspid regurgitation signal is inadequate for assessing PA pressure. Left Atrium: Left atrial size was normal in size. Right Atrium: Right atrial size was normal in size. Pericardium: There is no evidence of pericardial effusion. Mitral Valve: The mitral valve is normal in structure. Mild to moderate mitral valve regurgitation. No evidence of mitral valve stenosis. Tricuspid Valve: The tricuspid valve is normal in structure. Tricuspid valve regurgitation is not demonstrated. No evidence of tricuspid stenosis. Aortic Valve: The aortic valve is normal in structure. Aortic valve regurgitation is moderate. No aortic stenosis is present. Pulmonic Valve: The pulmonic valve was normal in structure. Pulmonic valve regurgitation is mild. No evidence of pulmonic stenosis. Aorta: The aortic root is normal in size and structure. Venous: The inferior vena cava is normal in size with less  than 50% respiratory variability, suggesting right atrial pressure of 8 mmHg. IAS/Shunts: No atrial level shunt detected by color flow Doppler.  LEFT VENTRICLE PLAX 2D LVIDd:         4.50 cm LVIDs:         3.80 cm LV PW:  1.10 cm LV IVS:        1.10 cm LVOT diam:     1.91 cm LV SV:         38 LV SV Index:   23 LVOT Area:     2.87 cm  LV Volumes (MOD) LV vol d, MOD A4C: 113.0 ml LV vol s, MOD A4C: 62.2 ml LV SV MOD A4C:     113.0 ml RIGHT VENTRICLE            IVC RV Basal diam:  3.46 cm    IVC diam: 2.03 cm RV S prime:     9.65 cm/s TAPSE (M-mode): 1.5 cm LEFT ATRIUM             Index        RIGHT ATRIUM           Index LA diam:        4.22 cm 2.63 cm/m   RA Area:     13.20 cm LA Vol (A2C):   45.1 ml 28.09 ml/m  RA Volume:   29.40 ml  18.31 ml/m LA Vol (A4C):   45.9 ml 28.59 ml/m LA Biplane Vol: 45.8 ml 28.53 ml/m  AORTIC VALVE LVOT Vmax:   88.60 cm/s LVOT Vmean:  59.700 cm/s LVOT VTI:    0.131 m  AORTA Ao Root diam: 3.20 cm Ao Asc diam:  3.74 cm MR Peak grad: 79.9 mmHg MR Vmax:      447.00 cm/s SHUNTS                           Systemic VTI:  0.13 m                           Systemic Diam: 1.91 cm Morene Brownie Electronically signed by Morene Brownie Signature Date/Time: 01/18/2024/5:42:06 PM    Final    DG Chest Portable 1 View Result Date: 01/18/2024 EXAM: 1 VIEW(S) XRAY OF THE CHEST 01/18/2024 12:27:15 PM COMPARISON: Chest x-ray 11/13/2017, CT abdomen and pelvis at 11:20:21. CLINICAL HISTORY: dyspnea, afib FINDINGS: LUNGS AND PLEURA: Perihilar and bibasilar interstitial opacities. Possible trace bilateral pleural effusions. No pneumothorax. HEART AND MEDIASTINUM: Mild cardiomegaly. No acute abnormality of the mediastinal silhouette. BONES AND SOFT TISSUES: Multilevel thoracic osteophytosis. No acute osseous abnormality. IMPRESSION: 1. Perihilar and bibasilar interstitial opacities with possible trace bilateral pleural effusions. 2. Mild cardiomegaly. Electronically signed by: Morgane Naveau MD  01/18/2024 02:01 PM EST RP Workstation: [id number]    Cardiac Studies   Echocardiogram:  1. Left ventricular ejection fraction, by estimation, is 35 to 40%. The  left ventricle has moderately decreased function. The left ventricle  demonstrates global hypokinesis. There is mild concentric left ventricular  hypertrophy. Left ventricular  diastolic function could not be evaluated.   2. Right ventricular systolic function is moderately reduced. The right  ventricular size is normal. Tricuspid regurgitation signal is inadequate  for assessing PA pressure.   3. The mitral valve is normal in structure. Mild to moderate mitral valve  regurgitation. No evidence of mitral stenosis.   4. The aortic valve is normal in structure. Aortic valve regurgitation is  moderate. No aortic stenosis is present.   5. The inferior vena cava is normal in size with <50% respiratory  variability, suggesting right atrial pressure of 8 mmHg.    Patient Profile     83 y.o. female  with a hx of seizure disorder,  paroxysmal atrial fibrillation on Eliquis , combined systolic and diastolic heart failure, hypertension, nonischemic cardiomyopathy, and hyperlipidemia who is being seen 01/18/2024 for the evaluation of atrial fibrillation at the request of Rondell Smith.   Assessment & Plan    Atrial fib with RVR:   Treated with IV amio.  Rate not controlled with IV Dilt.  Now status post DCCV.   Continue Eliquis .    Resume previous dose 100 mg amiodarone  PO daily at discharge. The plan will be oral amio and out patient EP  consideration of ablation.   She needs to discuss with Dr. Anner the interaction between Keppra  and Eliquis .    Combined chronic systolic and diastolic HF:  Non ischemic.  I reviewed the out patient notes and Dr. Anner was leery of up titrating meds because of dizziness.  I would like to change Cozaar  to Entresto  and have about a two week follow up for med titration and lab follow up.    For  questions or updates, please contact [facility] Please consult [email or web address] for contact info under Cardiology/STEMI.   Signed, Lynwood Schilling, MD  01/19/2024, 9:37 AM    "

## 2024-01-19 NOTE — Anesthesia Preprocedure Evaluation (Signed)
"                                    Anesthesia Evaluation  Patient identified by MRN, date of birth, ID band Patient awake    Reviewed: Allergy & Precautions, H&P , NPO status , Patient's Chart, lab work & pertinent test results  History of Anesthesia Complications (+) PONV and history of anesthetic complications  Airway Mallampati: II  TM Distance: >3 FB Neck ROM: Full    Dental no notable dental hx.    Pulmonary sleep apnea    Pulmonary exam normal breath sounds clear to auscultation       Cardiovascular hypertension, +CHF  Normal cardiovascular exam+ dysrhythmias Atrial Fibrillation  Rhythm:Regular Rate:Normal  01/18/24: IMPRESSIONS     1. Left ventricular ejection fraction, by estimation, is 35 to 40%. The  left ventricle has moderately decreased function. The left ventricle  demonstrates global hypokinesis. There is mild concentric left ventricular  hypertrophy. Left ventricular  diastolic function could not be evaluated.   2. Right ventricular systolic function is moderately reduced. The right  ventricular size is normal. Tricuspid regurgitation signal is inadequate  for assessing PA pressure.   3. The mitral valve is normal in structure. Mild to moderate mitral valve  regurgitation. No evidence of mitral stenosis.   4. The aortic valve is normal in structure. Aortic valve regurgitation is  moderate. No aortic stenosis is present.   5. The inferior vena cava is normal in size with <50% respiratory  variability, suggesting right atrial pressure of 8 mmHg.      Neuro/Psych  Headaches, Seizures -, Well Controlled,   negative psych ROS   GI/Hepatic Neg liver ROS,GERD  ,,  Endo/Other  negative endocrine ROS    Renal/GU Renal disease  negative genitourinary   Musculoskeletal negative musculoskeletal ROS (+)    Abdominal   Peds negative pediatric ROS (+)  Hematology negative hematology ROS (+)   Anesthesia Other Findings    Reproductive/Obstetrics negative OB ROS                              Anesthesia Physical Anesthesia Plan  ASA: 3  Anesthesia Plan: General   Post-op Pain Management:    Induction: Intravenous  PONV Risk Score and Plan: 4 or greater and Treatment may vary due to age or medical condition  Airway Management Planned: Natural Airway and Simple Face Mask  Additional Equipment: None  Intra-op Plan:   Post-operative Plan:   Informed Consent: I have reviewed the patients History and Physical, chart, labs and discussed the procedure including the risks, benefits and alternatives for the proposed anesthesia with the patient or authorized representative who has indicated his/her understanding and acceptance.     Dental advisory given  Plan Discussed with: CRNA  Anesthesia Plan Comments:          Anesthesia Quick Evaluation  "

## 2024-01-26 LAB — LAB REPORT - SCANNED: EGFR: 69

## 2024-01-29 NOTE — Progress Notes (Unsigned)
"   °  °  Cardiology Office Note Date:  01/29/2024  ID:  Maureen [name], DOB [birth year], MRN [id number] PCP:  Loreli Kins, MD  Cardiologist:  Joelle VEAR Ren Donley, MD  No chief complaint on file.    Problems NICM TTE 12/25: 35-40%, GH, mild LVH, mod RVSF (down from 60-65% in 2022) LHC 2/20: Non-obstructive CAD, 30% prox RCA pAF on Eliquis  M: AE100, AN5, PRN40, SV 24-26  Visits  12/25 Admit: Afib with RVR s/p DCCV, unable to tolerate GDMT due to dizziness; outpatient ablation? LN to SV 24-26 1/26: LP, EP referral for ablation given NICM, SE and EN     ROS: Please see the history of present illness. All other systems are reviewed and negative.    PHYSICAL EXAM: VS:  There were no vitals taken for this visit. , BMI There is no height or weight on file to calculate BMI. GEN: Well nourished, well developed, in no acute distress HEENT: normal Neck: no JVD, carotid bruits, or masses Cardiac: ***RRR; no murmurs, rubs, or gallops,no edema  Respiratory:  CTAB bilaterally, normal work of breathing GI: soft, nontender, nondistended, + BS Extremities: No LE edema Skin: warm and dry, no rash Neuro:  Strength and sensation are intact  EKG: ***  Recent Labs: Reviewed  Studies: Reviewed  ASSESSMENT AND PLAN: Maureen [name] is a 84 y.o. female who presents for follow up.   Signed, Joelle VEAR Ren Donley, MD  01/29/2024 9:46 AM    [facility] "

## 2024-02-01 ENCOUNTER — Other Ambulatory Visit: Payer: Self-pay | Admitting: *Deleted

## 2024-02-01 ENCOUNTER — Other Ambulatory Visit (HOSPITAL_COMMUNITY): Payer: Self-pay

## 2024-02-01 ENCOUNTER — Ambulatory Visit

## 2024-02-01 VITALS — BP 130/50 | HR 60 | Ht 62.0 in | Wt 131.3 lb

## 2024-02-01 DIAGNOSIS — I480 Paroxysmal atrial fibrillation: Secondary | ICD-10-CM | POA: Diagnosis not present

## 2024-02-01 DIAGNOSIS — I2510 Atherosclerotic heart disease of native coronary artery without angina pectoris: Secondary | ICD-10-CM

## 2024-02-01 DIAGNOSIS — E782 Mixed hyperlipidemia: Secondary | ICD-10-CM

## 2024-02-01 DIAGNOSIS — Z7901 Long term (current) use of anticoagulants: Secondary | ICD-10-CM

## 2024-02-01 DIAGNOSIS — I428 Other cardiomyopathies: Secondary | ICD-10-CM

## 2024-02-01 DIAGNOSIS — I5042 Chronic combined systolic (congestive) and diastolic (congestive) heart failure: Secondary | ICD-10-CM | POA: Diagnosis not present

## 2024-02-01 MED ORDER — METOPROLOL TARTRATE 25 MG PO TABS
25.0000 mg | ORAL_TABLET | Freq: Once | ORAL | 0 refills | Status: AC
  Filled 2024-02-01: qty 1, 1d supply, fill #0

## 2024-02-01 MED ORDER — SPIRONOLACTONE 25 MG PO TABS
25.0000 mg | ORAL_TABLET | Freq: Every day | ORAL | 3 refills | Status: DC
  Filled 2024-02-01: qty 20, 20d supply, fill #0

## 2024-02-01 NOTE — Patient Instructions (Addendum)
 Medication Instructions:  Start spironolactone  25 mg daily *If you need a refill on your cardiac medications before your next appointment, please call your pharmacy*  Lab Work: Fasting lipid, BMET, Mag-in 2 weeks. You can have this drawn at any [facility]. If you have labs (blood work) drawn today and your tests are completely normal, you will receive your results only by: MyChart Message (if you have MyChart) OR A paper copy in the mail If you have any lab test that is abnormal or we need to change your treatment, we will call you to review the results.  Testing/Procedures:   Your cardiac CT will be scheduled at one of the below locations:   [facility] [address] [place], [place] [address] [phone] (Severe contrast allergies only)  OR   [facility] [address] [place], [place] [address] [phone]  OR   [facility] [address] [place], [place] [address] [phone]  OR   [facility] [address]  [place], [place] [address]  OR   [facility] [address] [place], [place] [phone]  If scheduled at [facility], please arrive at the [facility] and Children's Entrance (Entrance C2) of [facility] 30 minutes prior to test start time. You can use the FREE valet parking offered at entrance C (encouraged to control the heart rate for the test)  Proceed to the [facility] Radiology Department (first floor) to check-in and test prep.  All radiology patients and guests should use entrance C2 at [facility], accessed from [facility], even though the hospital's physical address listed is [address]  If scheduled at the Heart and Vascular Tower at [address], please enter the parking lot using the [address] entrance and use the FREE valet service at the patient drop-off area. Enter the building and check-in with  registration on the main floor.  If scheduled at [facility], please arrive to the Heart and Vascular Center 15 mins early for check-in and test prep.  There is spacious parking and easy access to the radiology department from the [facility] Heart and Vascular entrance. Please enter here and check-in with the desk attendant.   If scheduled at [facility], please arrive 30 minutes early for check-in and test prep.  Please follow these instructions carefully (unless otherwise directed):  An IV will be required for this test and Nitroglycerin will be given.  Hold all erectile dysfunction medications at least 3 days (72 hrs) prior to test. (Ie viagra, cialis, sildenafil, tadalafil, etc)   On the Night Before the Test: Be sure to Drink plenty of water. Do not consume any caffeinated/decaffeinated beverages or chocolate 12 hours prior to your test. Do not take any antihistamines 12 hours prior to your test.  If the patient has contrast allergy: Patient will need a prescription for Prednisone and very clear instructions (as follows): Prednisone 50 mg - take 13 hours prior to test Take another Prednisone 50 mg 7 hours prior to test Take another Prednisone 50 mg 1 hour prior to test Take Benadryl 50 mg 1 hour prior to test Patient must complete all four doses of above prophylactic medications. Patient will need a ride after test due to Benadryl.  On the Day of the Test: Drink plenty of water until 1 hour prior to the test. Do not eat any food 1 hour prior to test. You  may take your regular medications prior to the test.  Take metoprolol  (Lopressor ) two hours prior to test. If you take Furosemide /Hydrochlorothiazide/Spironolactone /Chlorthalidone, please HOLD on the morning of the test. Patients who wear a continuous glucose monitor MUST remove the device prior to scanning. FEMALES- please wear underwire-free bra if available, avoid dresses & tight clothing        After the Test: Drink plenty of water. After receiving IV contrast, you may experience a mild flushed feeling. This is normal. On occasion, you may experience a mild rash up to 24 hours after the test. This is not dangerous. If this occurs, you can take Benadryl 25 mg, Zyrtec, Claritin, or Allegra and increase your fluid intake. (Patients taking Tikosyn should avoid Benadryl, and may take Zyrtec, Claritin, or Allegra) If you experience trouble breathing, this can be serious. If it is severe call 911 IMMEDIATELY. If it is mild, please call our office.  We will call to schedule your test 2-4 weeks out understanding that some insurance companies will need an authorization prior to the service being performed.   For more information and frequently asked questions, please visit our website : [email or web address]  For non-scheduling related questions, please contact the cardiac imaging nurse navigator should you have any questions/concerns: Cardiac Imaging Nurse Navigators Direct Office Dial: [phone]   For scheduling needs, including cancellations and rescheduling, please call Brittany, [phone].   Follow-Up: At [facility], you and your health needs are our priority.  As part of our continuing mission to provide you with exceptional heart care, our providers are all part of one team.  This team includes your primary Cardiologist (physician) and Advanced Practice Providers or APPs (Physician Assistants and Nurse Practitioners) who all work together to provide you with the care you need, when you need it.  Your next appointment:   2 month(s)  Provider:   Dr Ren Lanius have been referred to (EP) electrophysiology.

## 2024-02-09 MED ORDER — SPIRONOLACTONE 25 MG PO TABS: 25.0000 mg | ORAL_TABLET | Freq: Every day | ORAL | 3 refills | Status: AC

## 2024-02-10 ENCOUNTER — Telehealth (HOSPITAL_COMMUNITY): Payer: Self-pay | Admitting: *Deleted

## 2024-02-10 NOTE — Telephone Encounter (Signed)
 Reaching out to patient to offer assistance regarding upcoming cardiac imaging study; pt verbalizes understanding of appt date/time, parking situation and where to check in, pre-test NPO status, and verified current allergies; name and call back number provided for further questions should they arise  Larey Brick RN Navigator Cardiac Imaging [facility] [phone] office [phone] cell

## 2024-02-12 ENCOUNTER — Ambulatory Visit (HOSPITAL_COMMUNITY): Admission: RE | Admit: 2024-02-12 | Discharge: 2024-02-12 | Disposition: A | Source: Ambulatory Visit

## 2024-02-12 DIAGNOSIS — I428 Other cardiomyopathies: Secondary | ICD-10-CM | POA: Insufficient documentation

## 2024-02-12 DIAGNOSIS — I5042 Chronic combined systolic (congestive) and diastolic (congestive) heart failure: Secondary | ICD-10-CM | POA: Diagnosis present

## 2024-02-12 MED ORDER — NITROGLYCERIN 0.4 MG SL SUBL
0.8000 mg | SUBLINGUAL_TABLET | Freq: Once | SUBLINGUAL | Status: AC
  Administered 2024-02-12: 0.8 mg via SUBLINGUAL

## 2024-02-12 MED ORDER — IOHEXOL 350 MG/ML SOLN
100.0000 mL | Freq: Once | INTRAVENOUS | Status: AC | PRN
  Administered 2024-02-12: 100 mL via INTRAVENOUS

## 2024-02-15 ENCOUNTER — Ambulatory Visit: Payer: Self-pay

## 2024-02-15 ENCOUNTER — Ambulatory Visit: Admitting: Cardiology

## 2024-02-19 LAB — BASIC METABOLIC PANEL WITH GFR
BUN/Creatinine Ratio: 33 — ABNORMAL HIGH (ref 12–28)
BUN: 26 mg/dL (ref 8–27)
CO2: 21 mmol/L (ref 20–29)
Calcium: 10 mg/dL (ref 8.7–10.3)
Chloride: 106 mmol/L (ref 96–106)
Creatinine, Ser: 0.79 mg/dL (ref 0.57–1.00)
Glucose: 81 mg/dL (ref 70–99)
Potassium: 5.3 mmol/L — ABNORMAL HIGH (ref 3.5–5.2)
Sodium: 141 mmol/L (ref 134–144)
eGFR: 73 mL/min/{1.73_m2}

## 2024-02-19 LAB — MAGNESIUM: Magnesium: 2.2 mg/dL (ref 1.6–2.3)

## 2024-02-19 NOTE — Telephone Encounter (Signed)
"   I called Ms Sirmons to discuss the results of her labs and explained that her potassium was elevated. She had only taken 2 doses of the spironolactone  and stopped taking it due to trouble with sleeping. I recommended that she continues to hold and repeat labs in about a week.  Joelle DEL. Ren Ny, MD [facility]  "

## 2024-03-31 ENCOUNTER — Ambulatory Visit
